# Patient Record
Sex: Female | Born: 1940 | Race: White | Hispanic: No | Smoking: Former smoker
Health system: Southern US, Community
[De-identification: ages and names within clinical notes are randomized; demographics above are authoritative.]

## PROBLEM LIST (undated history)

## (undated) DIAGNOSIS — G5630 Lesion of radial nerve, unspecified upper limb: Secondary | ICD-10-CM

## (undated) DIAGNOSIS — Z789 Other specified health status: Secondary | ICD-10-CM

## (undated) DIAGNOSIS — E785 Hyperlipidemia, unspecified: Secondary | ICD-10-CM

## (undated) DIAGNOSIS — M4802 Spinal stenosis, cervical region: Secondary | ICD-10-CM

## (undated) DIAGNOSIS — R911 Solitary pulmonary nodule: Secondary | ICD-10-CM

## (undated) DIAGNOSIS — S56419A Strain of extensor muscle, fascia and tendon of finger, unspecified finger at forearm level, initial encounter: Secondary | ICD-10-CM

## (undated) DIAGNOSIS — J449 Chronic obstructive pulmonary disease, unspecified: Secondary | ICD-10-CM

## (undated) DIAGNOSIS — F32A Depression, unspecified: Secondary | ICD-10-CM

## (undated) DIAGNOSIS — R42 Dizziness and giddiness: Secondary | ICD-10-CM

## (undated) DIAGNOSIS — I4891 Unspecified atrial fibrillation: Secondary | ICD-10-CM

## (undated) DIAGNOSIS — I639 Cerebral infarction, unspecified: Secondary | ICD-10-CM

## (undated) DIAGNOSIS — F419 Anxiety disorder, unspecified: Secondary | ICD-10-CM

## (undated) DIAGNOSIS — E039 Hypothyroidism, unspecified: Secondary | ICD-10-CM

## (undated) DIAGNOSIS — M879 Osteonecrosis, unspecified: Secondary | ICD-10-CM

## (undated) DIAGNOSIS — M47812 Spondylosis without myelopathy or radiculopathy, cervical region: Secondary | ICD-10-CM

## (undated) HISTORY — PX: OOPHORECTOMY: SHX6387

## (undated) HISTORY — PX: OTHER SURGICAL HISTORY: SHX169

## (undated) HISTORY — PX: APPENDECTOMY: SHX54

## (undated) HISTORY — PX: LOOP RECORDER IMPLANT: SHX5954

## (undated) HISTORY — PX: ABDOMINAL HYSTERECTOMY: SHX81

---

## 1969-05-26 HISTORY — PX: BREAST BIOPSY: SHX20

## 1969-05-26 HISTORY — PX: BREAST CYST EXCISION: SHX579

## 2006-01-15 ENCOUNTER — Emergency Department: Payer: Self-pay | Admitting: Emergency Medicine

## 2006-07-19 ENCOUNTER — Emergency Department: Payer: Self-pay | Admitting: Internal Medicine

## 2006-07-19 ENCOUNTER — Other Ambulatory Visit: Payer: Self-pay

## 2006-08-01 ENCOUNTER — Emergency Department: Payer: Self-pay | Admitting: Emergency Medicine

## 2012-05-03 LAB — CK TOTAL AND CKMB (NOT AT ARMC)
CK, Total: 98 U/L (ref 21–215)
CK-MB: 1.5 ng/mL (ref 0.5–3.6)

## 2012-05-03 LAB — BASIC METABOLIC PANEL
BUN: 9 mg/dL (ref 7–18)
Calcium, Total: 9.2 mg/dL (ref 8.5–10.1)
Chloride: 107 mmol/L (ref 98–107)
Creatinine: 0.82 mg/dL (ref 0.60–1.30)
EGFR (African American): >60
Glucose: 101 mg/dL — ABNORMAL HIGH (ref 65–99)
Potassium: 3.4 mmol/L — ABNORMAL LOW (ref 3.5–5.1)
Sodium: 142 mmol/L (ref 136–145)

## 2012-05-03 LAB — CBC
HCT: 31.2 % — ABNORMAL LOW (ref 35.0–47.0)
HGB: 10.1 g/dL — ABNORMAL LOW (ref 12.0–16.0)
MCHC: 32.4 g/dL (ref 32.0–36.0)
MCV: 88 fL (ref 80–100)
RBC: 3.56 10*6/uL — ABNORMAL LOW (ref 3.80–5.20)
RDW: 15.9 % — ABNORMAL HIGH (ref 11.5–14.5)
WBC: 11 10*3/uL (ref 3.6–11.0)

## 2012-05-03 LAB — TROPONIN I: Troponin-I: <0.02 ng/mL

## 2012-05-04 ENCOUNTER — Inpatient Hospital Stay: Payer: Self-pay | Admitting: Specialist

## 2012-05-04 LAB — CK TOTAL AND CKMB (NOT AT ARMC)
CK, Total: 95 U/L (ref 21–215)
CK-MB: 2 ng/mL (ref 0.5–3.6)

## 2012-05-05 LAB — BASIC METABOLIC PANEL
Anion Gap: 7 (ref 7–16)
Calcium, Total: 9.6 mg/dL (ref 8.5–10.1)
Co2: 28 mmol/L (ref 21–32)
Creatinine: 0.7 mg/dL (ref 0.60–1.30)
EGFR (African American): >60
EGFR (Non-African Amer.): >60
Glucose: 109 mg/dL — ABNORMAL HIGH (ref 65–99)
Osmolality: 285 (ref 275–301)
Potassium: 4.2 mmol/L (ref 3.5–5.1)

## 2012-05-05 LAB — CBC WITH DIFFERENTIAL/PLATELET
Basophil #: 0 10*3/uL (ref 0.0–0.1)
Eosinophil %: 0 %
HCT: 29.6 % — ABNORMAL LOW (ref 35.0–47.0)
Lymphocyte #: 1.2 10*3/uL (ref 1.0–3.6)
Lymphocyte %: 9.7 %
MCH: 28.4 pg (ref 26.0–34.0)
MCHC: 32.7 g/dL (ref 32.0–36.0)
MCV: 87 fL (ref 80–100)
Monocyte %: 4.7 %
Platelet: 331 10*3/uL (ref 150–440)
RDW: 15.9 % — ABNORMAL HIGH (ref 11.5–14.5)
WBC: 12.3 10*3/uL — ABNORMAL HIGH (ref 3.6–11.0)

## 2012-05-09 LAB — CULTURE, BLOOD (SINGLE)

## 2012-09-29 ENCOUNTER — Inpatient Hospital Stay: Payer: Self-pay | Admitting: Student

## 2012-09-29 LAB — CBC WITH DIFFERENTIAL/PLATELET
Basophil #: 0.1 10*3/uL (ref 0.0–0.1)
Basophil %: 0.6 %
Eosinophil #: 0.2 10*3/uL (ref 0.0–0.7)
HCT: 34.8 % — ABNORMAL LOW (ref 35.0–47.0)
HGB: 11.2 g/dL — ABNORMAL LOW (ref 12.0–16.0)
MCHC: 32.1 g/dL (ref 32.0–36.0)
Monocyte #: 0.9 x10 3/mm (ref 0.2–0.9)
Monocyte %: 5 %
Neutrophil #: 16.4 10*3/uL — ABNORMAL HIGH (ref 1.4–6.5)
Neutrophil %: 87.2 %
Platelet: 326 10*3/uL (ref 150–440)
RBC: 3.93 10*6/uL (ref 3.80–5.20)
WBC: 18.8 10*3/uL — ABNORMAL HIGH (ref 3.6–11.0)

## 2012-09-29 LAB — COMPREHENSIVE METABOLIC PANEL
Alkaline Phosphatase: 71 U/L (ref 50–136)
Anion Gap: 8 (ref 7–16)
BUN: 14 mg/dL (ref 7–18)
Calcium, Total: 9.6 mg/dL (ref 8.5–10.1)
Co2: 28 mmol/L (ref 21–32)
EGFR (African American): >60
EGFR (Non-African Amer.): >60
Potassium: 3.6 mmol/L (ref 3.5–5.1)
Total Protein: 7.2 g/dL (ref 6.4–8.2)

## 2012-09-29 LAB — TROPONIN I: Troponin-I: <0.02 ng/mL

## 2012-09-30 LAB — CBC WITH DIFFERENTIAL/PLATELET
Basophil #: 0 10*3/uL (ref 0.0–0.1)
Basophil %: 0.1 %
Eosinophil #: 0 10*3/uL (ref 0.0–0.7)
Eosinophil %: 0 %
HCT: 30.5 % — ABNORMAL LOW (ref 35.0–47.0)
HGB: 10.2 g/dL — ABNORMAL LOW (ref 12.0–16.0)
Lymphocyte #: 0.4 10*3/uL — ABNORMAL LOW (ref 1.0–3.6)
MCHC: 33.2 g/dL (ref 32.0–36.0)
MCV: 89 fL (ref 80–100)
Monocyte %: 2.2 %
Neutrophil #: 18.6 10*3/uL — ABNORMAL HIGH (ref 1.4–6.5)
Neutrophil %: 95.5 %
RBC: 3.45 10*6/uL — ABNORMAL LOW (ref 3.80–5.20)
WBC: 19.5 10*3/uL — ABNORMAL HIGH (ref 3.6–11.0)

## 2012-09-30 LAB — URINALYSIS, COMPLETE
Ketone: NEGATIVE
Leukocyte Esterase: NEGATIVE
Ph: 6 (ref 4.5–8.0)
Protein: NEGATIVE
RBC,UR: 1 /HPF (ref 0–5)
Specific Gravity: 1.012 (ref 1.003–1.030)
Squamous Epithelial: 1

## 2012-09-30 LAB — BASIC METABOLIC PANEL
Anion Gap: 6 — ABNORMAL LOW (ref 7–16)
BUN: 14 mg/dL (ref 7–18)
Co2: 29 mmol/L (ref 21–32)
EGFR (African American): >60
Glucose: 159 mg/dL — ABNORMAL HIGH (ref 65–99)
Osmolality: 281 (ref 275–301)

## 2012-10-01 LAB — BASIC METABOLIC PANEL
Anion Gap: 6 — ABNORMAL LOW (ref 7–16)
Calcium, Total: 9.6 mg/dL (ref 8.5–10.1)
Co2: 28 mmol/L (ref 21–32)
Creatinine: 0.6 mg/dL (ref 0.60–1.30)
EGFR (Non-African Amer.): >60
Glucose: 151 mg/dL — ABNORMAL HIGH (ref 65–99)

## 2012-10-01 LAB — HEMOGLOBIN A1C: Hemoglobin A1C: 5 % (ref 4.2–6.3)

## 2012-10-05 LAB — CULTURE, BLOOD (SINGLE)

## 2013-12-01 ENCOUNTER — Encounter (INDEPENDENT_AMBULATORY_CARE_PROVIDER_SITE_OTHER): Payer: Medicare Other | Admitting: Ophthalmology

## 2013-12-01 DIAGNOSIS — H3530 Unspecified macular degeneration: Secondary | ICD-10-CM

## 2013-12-01 DIAGNOSIS — H26499 Other secondary cataract, unspecified eye: Secondary | ICD-10-CM

## 2013-12-01 DIAGNOSIS — H43819 Vitreous degeneration, unspecified eye: Secondary | ICD-10-CM

## 2013-12-15 ENCOUNTER — Encounter (INDEPENDENT_AMBULATORY_CARE_PROVIDER_SITE_OTHER): Payer: Medicare Other | Admitting: Ophthalmology

## 2013-12-15 DIAGNOSIS — H26499 Other secondary cataract, unspecified eye: Secondary | ICD-10-CM

## 2014-01-12 ENCOUNTER — Encounter (INDEPENDENT_AMBULATORY_CARE_PROVIDER_SITE_OTHER): Payer: Medicare Other | Admitting: Ophthalmology

## 2014-01-12 DIAGNOSIS — H2700 Aphakia, unspecified eye: Secondary | ICD-10-CM

## 2014-09-12 NOTE — Discharge Summary (Signed)
PATIENT NAME:  Christine Morris, Christine Morris MR#:  [id number] DATE OF BIRTH:  Aug 24, 1940  DATE OF ADMISSION:  05/04/2012 DATE OF DISCHARGE:  05/05/2012  For a detailed note, please take a look at the history and physical done on admission by Dr. Margaretmary Eddy.   DIAGNOSES AT DISCHARGE:  1. Chest pain, likely related to chronic obstructive pulmonary disease.  2. Chronic obstructive pulmonary disease exacerbation.  3. Pneumonia, likely community-acquired pneumonia.  4. Hypothyroidism.  5. Anxiety.  6. Gastroesophageal reflux disease.   DIET: Patient is being discharged on a low sodium, low fat diet.   ACTIVITY: As tolerated.   FOLLOW UP: Follow up with Dr. Ronnald Collum in the next 1 to 2 weeks.   DISCHARGE MEDICATIONS:  1. Fenofibrate 145 mg daily.  2. Albuterol inhaler 2 puffs q.6 hours. 3. Advair 500/50, 1 puff b.i.d.  4. Spiriva 1 puff daily.  5. Albuterol ipratropium nebulizer b.i.d. as needed.  6. Vytorin 10/20 daily.  7. Synthroid 100 mcg daily.  8. Calcitriol 0.5 mcg one cap daily.  9. Vitamin D2 50,000 international units 3 times weekly.  10. Singulair 10 mg b.i.d.  11. Paxil 20 mg daily.  12. Prednisone taper starting at 60 mg down to 10 mg over the next six days. 13. Levaquin 500 mg daily x5 days.   LABORATORY, DIAGNOSTIC AND RADIOLOGICAL DATA: Pertinent studies during hospital course are as follows: Chest x-ray done on admission showing atelectasis versus infiltrate in the region of the lingual.   HOSPITAL COURSE: This is a 74 year old female with medical problems as mentioned above presented to the hospital with exertional shortness of breath and also sharp chest pain.  1. Chest pain. Patient was observed on off unit telemetry. Had three sets of cardiac markers checked which were negative. Her chest pain shortly resolved the day after admission. Most likely the cause of her chest pain was related to her chronic obstructive pulmonary disease and pneumonia, unlikely related to acute cardiac  process.  2. Chronic obstructive pulmonary disease exacerbation. This was likely secondary to community-acquired pneumonia. Patient was started on IV Solu-Medrol along with her maintenance inhalers, Advair and Spiriva, and around the clock nebulizer treatments and also given empiric antibiotics. Patient has clinically improved since yesterday. Is currently being discharged on a prednisone taper along with Levaquin for a few days. She will continue her Advair, Spiriva and her nebulizer treatments as stated.  3. Community-acquired pneumonia, likely the cause of patient's chronic obstructive pulmonary disease exacerbation. Patient remained afebrile. Her blood cultures remained negative although she was empirically treated with ceftriaxone and Zithromax and is currently being discharged on p.o. Levaquin as stated.  4. Hypothyroidism. Patient was maintained on Synthroid and she will resume that.  5. Hyperlipidemia. Patient will continue her Vytorin as stated.  6. Anxiety. The patient will continue her Paxil as she was maintained on that.  7. CODE STATUS: Patient is a FULL CODE. She is also being discharged on home oxygen. She was already using oxygen at home prior to coming to the hospital.   TIME SPENT: 40 minutes.  ____________________________ Belia Heman. Verdell Carmine, MD vjs:cms D: 05/05/2012 15:27:24 ET T: 05/06/2012 09:13:55 ET JOB#: [id number]  cc: Belia Heman. Verdell Carmine, MD, <Dictator> Lenard Simmer, MD Henreitta Leber MD ELECTRONICALLY SIGNED 05/20/2012 14:40

## 2014-09-12 NOTE — H&P (Signed)
PATIENT NAME:  Christine Morris, Christine Morris MR#:  [id number] DATE OF BIRTH:  Apr 16, 1941  DATE OF ADMISSION:  05/04/2012  PRIMARY CARE PHYSICIAN: Lenard Simmer, MD    REFERRING PHYSICIAN:   Dr. Dahlia Client  CHIEF COMPLAINT: Chest pain and shortness of breath.   HISTORY OF PRESENT ILLNESS: The patient is a 74 year old Caucasian female with a past medical history of hypothyroidism, chronic obstructive pulmonary disease, oxygen dependent, lives on 3 liters, and hyperlipidemia is presenting to the ER with a chief complaint of left-sided chest pain, which started at 7:00 p.m. today. Eventually, she started having shortness of breath at 8:30 p.m. she is reporting that the chest hurts when she takes deep inspiration only in the left inframammary area. This is associated with cough which is nonproductive. She denies any fever, denies any sick contacts. The patient has received a flu shot recently in October. She denies any dizziness or weakness. No other complaints. Chest x-ray in the ER has revealed left lower lobe infiltrate, and the patient was given IV Rocephin and Zithromax, last doses after obtaining blood cultures. During my examination, the patient is feeling slightly better. The patient also has received Solu-Medrol 125 mg IV for shortness of breath. She denies any recent history of antibiotic use. She denies any other complaints.   PAST MEDICAL HISTORY:  1. Hypothyroidism.  2. Chronic obstructive pulmonary disease, oxygen dependent, lives on 3 liters.  3. Hyperlipidemia.   PAST SURGICAL HISTORY:  1. Appendectomy.  2. Two C-sections.  3. Hysterectomy for polycystic ovarian syndrome.   ALLERGIES: No known drug allergies.     HOME MEDICATIONS:   1. Vytorin 10/20 p.o. once daily. 2. Vitamin D2 50,000, one capsules p.o. three times a week. 3. Synthroid 100 mcg once a day.  4. Spiriva 18 mcg inhalation, one inhalation each day.  5. ProAir 2 puffs inhalation every six hours. 6. Paroxetine 20 mg, one  tablet p.o. once a day. 7. Montelukast 10 mg twice a day ? about frequency.  8. Fenofibrate 145 mg, one tablet once a day. 9. Calcitriol 0.5 mcg once a day.  10. Albuterol ipratropium 2.5 mg inhalation two times a day.  11. Advair 500/50, one puff inhalation two times a day.   PSYCHOSOCIAL HISTORY: She lives at home, lives with fiance. She used to smoke, quit smoking two years ago. She denies any alcohol or illicit drug usage.   FAMILY HISTORY: Mother deceased with brain tumor. Dad deceased with liver cancer.   REVIEW OF SYSTEMS: CONSTITUTIONAL: Denies any fever. Complaining of weakness but denies any fatigue, pain, weight loss or weight gain. EYES: Denies any blurry vision, glaucoma, cataracts. ENT: Denies tinnitus, ear pain, ear discharge, snoring, postnasal drip, difficulty in swallowing. RESPIRATORY: Positive cough which is dry in nature. Complaining of wheezing. Denies hemoptysis. Positive shortness of breath. Denies asthma. Positive chronic obstructive pulmonary disease, oxygen dependent. CARDIOVASCULAR: Complaining of chest pain in the left inframammary area with deep inspirations only. Denies any orthopnea. Denies syncope, varicose veins, azotemia. GI: Denies nausea, vomiting, diarrhea, abdominal pain jaundice, rectal bleeding, constipation. GENITOURINARY: Denies dysuria, hematuria, renal calculus. GYN AND BREASTS: Denies breast tenderness. Vaginal discharge. ENDOCRINE: Denies polyuria, polyphagia, polydipsia. Denies any thyroid problems. HEMATOLOGIC/LYMPHATIC: Denies anemia, easy bruising, bleeding. INTEGUMENTARY: Denies acne, rash, lesions. MUSCULOSKELETAL: Denies any pain in the neck, back, shoulders, hip pain. Denies gout redness. NEUROLOGIC: Denies vertigo, ataxia, transient ischemic attack, seizure, memory loss, dysarthria. PSYCHIATRIC: Denies any anxiety, insomnia, bipolar disorder, depression.   PHYSICAL EXAMINATION:  VITAL SIGNS: Temperature 97.7,  pulse 92, respiratory rate 20, blood  pressure 117/57.   GENERAL APPEARANCE: Not in acute distress, moderately built and moderately nourished.  HEENT: Normocephalic, atraumatic. Pupils are equally reacting to light and accommodation. No scleral icterus. No conjunctival injection. Moist mucous membranes. No postnasal drip. Nares with no congestion. No ear discharge.  Tympanic membranes are intact.   NECK: Supple. No JVD. No thyromegaly. No carotid bruits.   LUNGS: Left lower lobe crackles. Minimal wheezing. No accessory muscle usage. Positive reproducible left-sided inframammary chest wall tenderness. No pulmonary edema. Peripheral pulses are 2+. No pedal edema.   GI: Soft. Bowel sounds are positive in all four quadrants. Nontender, nondistended. No masses felt.   NEUROLOGIC: Awake, alert, and oriented x3. Cranial nerves II through XII are grossly intact. Motor and sensory is intact. Reflexes are 2+.   SKIN: No acne, no rashes, no lesions.   MUSCULOSKELETAL: No joint swelling, effusion, erythema, tenderness. Range of motion is grossly intact.   EXTREMITIES: No edema. No cyanosis. No clubbing.   LABORATORY, DIAGNOSTIC AND RADIOLOGICAL DATA: Chest x-ray preliminary report: Left lower lobe infiltrate. WBC 11.0, hemoglobin 10.1, hematocrit 31.2, platelet count 365,000, MCV 88. Serum glucose 101,BUN  9, creatinine 0.82, serum sodium 142, potassium 3.4, chloride 107, CO2 29, calcium 9.2, anion gap is 6. GFR is greater than 60, serum osmolality 282. Troponin T less than 0.02. CK total 98, CK-MB 1.5. A 12-lead EKG: Normal sinus rhythm, nonspecific ST-T wave changes, normal PR interval with duration 132 milliseconds, normal QT  interval.    ASSESSMENT/PLAN:   1. Left lower lobe pneumonia: Admit to telemetry bed. We will get sputum culture and sensitivity. IV Rocephin and Zithromax. Bronchodilators on as needed basis.  2. Left inframammary chest pain with shortness of breath: Probably from problem #1. We will cycle cardiac biomarkers to rule  out acute coronary syndrome as well. Continue pneumonia treatment with IV Rocephin and Zithromax.  3. Mild chronic obstructive pulmonary disease exacerbation: The patient is oxygen dependent. We will continue 3 liters of oxygen to titrate pulse oximetry 90%. Solu-Medrol 125 mg IV x1 was given in the ER. We will continue 40 mg IV every 12 hours. Duo-Neb treatment every.6 hours. Proventil neb treatments every 4 hours as needed for shortness of breath.  4. Hyperlipidemia: Continue the patient's home medication, fenofibrate.  5. Hypothyroidism: Continue Synthroid.  6. We will provide GI and deep vein thrombosis prophylaxis with Protonix and Lovenox subcutaneous.  7. Check a.m. labs, CBC and BMP. Cycle cardiac biomarkers.   The diagnosis and plan of care was discussed in detail with the patient and her fiance at bedside. They both expressed understanding of the plan.   CODE STATUS:  The patient is FULL CODE.     TIME SPENT:   Time spent on the History and Physical and coordination of care and reviewing medical records is 50 minutes.   ____________________________ Nicholes Mango, MD ag:cbb D: 05/04/2012 01:12:52 ET T: 05/04/2012 07:48:21 ET JOB#: [id number] cc: Nicholes Mango, MD, <Dictator> Lenard Simmer, MD Nicholes Mango MD ELECTRONICALLY SIGNED 05/06/2012 1:39

## 2014-09-15 NOTE — H&P (Signed)
PATIENT NAME:  Christine Morris, Christine Morris MR#:  [id number] DATE OF BIRTH:  17-Sep-1940  DATE OF ADMISSION:  09/29/2012  PRIMARY CARE PHYSICIAN: Lenard Simmer, MD  EMERGENCY ROOM PHYSICIAN: Ponciano Ort, MD  CHIEF COMPLAINT: Shortness of breath.   HISTORY OF PRESENT ILLNESS: The patient is a 74 year old female patient with oxygen-dependent COPD, brought in by EMS because of increased shortness of breath and fever. The patient felt fine yesterday and also this morning. Started to have sudden shortness of breath at 1:00 in the afternoon and called EMS. When EMS arrived, the patient's O2 sats were around 83% on 3 liters, so EMS gave her 3 rounds of nebulizers and 1 dose of Solu-Medrol 62.5 mg. When she came here, the patient's temperature was 102.9 and she was saturating around 92% on 3 liters. The patient is tachycardic at a heart rate of 102 beats per minute and WBC is up to 18 with chest x-ray showing pneumonia. I was asked to admit for pneumonia and acute respiratory failure. Seeing the patient at bedside. Says that  sudden shortness of breath started this afternoon with some pleuritic chest pain on the left side. Denies any other complaints. No cough and also, the patient denies any body aches.   PAST MEDICAL HISTORY: Significant for history of hypertension, hyperlipidemia, COPD, oxygen dependent. She uses 3 liters of oxygen all the time for the past few months. Other past medical history includes hypothyroidism and also history of GERD and anxiety.   ALLERGIES: No known allergies.   SOCIAL HISTORY: Heavy smoker before, quit now. No alcohol. No drugs.   FAMILY HISTORY: Significant for history of mother with brain tumor. Dad had liver cancer.   PAST SURGICAL HISTORY: Significant for appendectomy, 2 cesareans and hysterectomy.  HOME MEDICATIONS:    1.  Advair Diskus 500/50 1 puff b.i.d.  2.  Albuterol 30 units 3 times a day.  3.  Calcitriol 0.5 mcg p.o. daily.  4.  TriCor 145 mg p.o. daily.  5.   Montelukast 10 mg p.o. daily.  6.  Paxil 20 mg p.o. daily.  7.  ProAir 90 mcg inhalation every 6 hours. 8.  Spiriva 18 mcg inhalation daily.  9.  Synthroid 100 mcg p.o. daily.  10.  Vitamin D2, 50,000 units Monday, Wednesday, Friday.  11.  Vytorin 10/20 mg 1 tablet daily.   REVIEW OF SYSTEMS:  CONSTITUTIONAL: Denies any weakness. Found to have fever in the Emergency Room.  EYES: No blurred vision.  EARS, NOSE, THROAT: Denies any sore throat. No ear pain. No difficulty swallowing.  GASTROINTESTINAL: Denies any nausea or vomiting. No abdominal pain.  GENITOURINARY: No dysuria.  MUSCULOSKELETAL: Denies any joint pain.  SKIN: No skin rashes.  NEUROLOGIC: Denies any headaches. PSYCHIATRIC: Does have anxiety.   PHYSICAL EXAMINATION:  VITAL SIGNS: Temperature 102.9 degrees Fahrenheit in the Emergency Room, heart rate 135, blood pressure 107/66, respiratory rate around 40 and O2 sats 92% on 3 liters. The patient initially was on 3 liters of oxygen, but she was started on BiPAP and right now, she is on BiPAP 10/5 and 28% FiO2, saturating around 97%.  GENERAL: The patient is alert, awake, oriented, appearing slightly anxious but not using accessory muscles for respiration. HEENT: Head atraumatic, normocephalic. Pupils equally reacting to light. Extraocular movements intact. No scleral icterus. No conjunctival injection. Mouth: Mucous membranes are dry. Nose: No turbinate hypertrophy. Ears: No tympanic membrane congestion. Throat: No oropharyngeal erythema.  NECK: Normal range of motion. No JVD. No carotid bruits. LUNGS: The  patient has some diffuse expiratory wheezing in all lung fields, not using accessory muscles for respiration. Some reproducible chest wall tenderness present on the left side.  CARDIOVASCULAR: Peripheral pulses are intact. No pedal edema.  ABDOMEN: Soft, nontender, nondistended. Bowel sounds present. No hernias.  MUSCULOSKELETAL: The patient has no joint swelling.  NEUROLOGIC:  Alert, awake, oriented. No focal neurological deficit. Cranial nerves II through XII intact. Power 5/5 in upper and lower extremities. Sensation is intact. DTRs 2+ bilaterally.  EXTREMITIES: No edema. No cyanosis. No clubbing.  SKIN: No skin rashes. Skin is slightly dry.   LABORATORY, DIAGNOSTIC AND RADIOLOGICAL DATA: White count 18.8, hemoglobin 11.2, hematocrit 34.8, platelets 326. Electrolytes: Sodium 141, potassium 3.6, chloride 105, bicarbonate 28, BUN 14, creatinine 0.7 and glucose 111. Troponin less than 0.02. EKG showed sinus tachycardia with 137 beats per minute. Chest x-ray consistent with some opacity in the left lower lobe.   ASSESSMENT AND PLAN: This is a 74 year old female patient with oxygen-dependent chronic obstructive pulmonary disease. Came in with shortness of breath, pleuritic chest pain with hypoxia, 83% saturations on 3 liters at home by [facility] with evidence of fever, tachycardia and leukocytosis. The patient is going to be admitted to hospitalist service.   DIAGNOSES:  1.  Systemic inflammatory response syndrome: Source likely is pneumonia. We are going to continue intravenous fluids, normal saline at 100 mL/h. She already had blood cultures. Please follow them. Continue intravenous Rocephin and Zithromax and follow the white count. The patient is tachycardic, likely secondary to underlying fever and pneumonia, so continue intravenous fluids and we can use a small dose of Cardizem if the heart rate continues to go up. We will start with 30 mg every 6 hours.  2.  Acute on chronic respiratory failure secondary to pneumonia and chronic obstructive pulmonary disease exacerbation: The patient is very anxious to come off the BiPAP. We will turn down BiPAP and start nasal cannula at 3 liters and see how she does. Continue Solu-Medrol, DuoNebs and oxygen 3 liters to keep saturations around 90% to 94%.  3.  Sudden shortness of breath and pleuritic chest pain with  tachycardia and hypoxia: We will evaluate for pulmonary emboli with CT chest with pulmonary embolism protocol.  4.  Anxiety: Continue Paxil.  5.  History of hyperlipidemia: The patient is on TriCor and also Vytorin. We will continue the TriCor and Vytorin.  6.  Hypothyroidism: Continue Synthroid 100 mcg orally daily. 7.  History of tobacco abuse before, now stopped: The patient is on Spiriva. Will continue that.  8.  The patient will be admitted to telemetry.   TIME SPENT ON HISTORY AND PHYSICAL: About 55 minutes. This is a critical history and physical.    ____________________________ Epifanio Lesches, MD sk:jm D: 09/29/2012 17:16:21 ET T: 09/29/2012 18:10:41 ET JOB#: [id number]  cc: Epifanio Lesches, MD, <Dictator> Lenard Simmer, MD Epifanio Lesches MD ELECTRONICALLY SIGNED 10/10/2012 17:47

## 2014-09-15 NOTE — Discharge Summary (Signed)
PATIENT NAME:  Christine Morris, Christine Morris MR#:  [id number] DATE OF BIRTH:  09-12-1940  DATE OF ADMISSION:  09/29/2012 DATE OF DISCHARGE:  10/03/2012  PRIMARY CARE PHYSICIAN: Belinda Fisher, MD  CHIEF COMPLAINT: Shortness of breath.   DISCHARGE DIAGNOSES: 1.  Acute on chronic respiratory failure secondary to pneumonia and chronic obstructive pulmonary disease exacerbation.  2.  Sepsis. 3.  Chronic oxygen-dependent respiratory failure.  4.  Hypertension.  5.  Hyperlipidemia.  6.  Hypothyroidism.  7.  History of gastroesophageal reflux disease. 8.  History of anxiety.   DISCHARGE MEDICATIONS:  1.  Fenofibrate 145 mg once a day. 2.  Advair 500/50 mcg 1 puff 2 times a day.  3.  Spiriva 18 mcg daily.  4.  Vytorin 10/20 mg 1 tab once a day.  5.  Synthroid 100 mcg 1 tab daily.  6.  Calcitriol 0.5 mg once a day.  7.  Vitamin D2 50,000 international units 3 times a week on Monday, Wednesday, and Friday. 8.  Paroxetine 20 mg daily.  9.  Albuterol/ipratropium 2.5/0.5 mg/3 mL inhaled solution 3 mL 4 times a day.  10.  Montelukast 10 mg every 12 hours.  11.  ProAir 2 every 6 hours as needed for wheezing or shortness of breath.  12.  HC/nystatin mouthwash 10 mL every 8 hours.  13.  Vantin 200 mg every 12 hours for 4 days.  14.  Prednisone taper 40 mg for 2 days, then 30 mg for 2 days, then 20 mg for 2, then 10 mg for 2 days then stop.    HOME OXYGEN:  She will be getting discharged with 3 liters of continuous oxygen via nasal cannula.   DIET: Low sodium, low fat, low cholesterol.   ACTIVITY: As tolerated.   DISCHARGE INSTRUCTIONS:  Please follow with PCP within 1 to 2 weeks and obtain a CT of the chest in 3 to 4 months for the lung nodule on the right lobe and abnormality seen in the left upper lobe.   DISPOSITION: Home.   CODE STATUS: FULL CODE.   SIGNIFICANT LABORATORY AND DIAGNOSTICS:  Initial WBC 18.8.  On May 9th it was 15.5. Initial hemoglobin 11.2. Initial troponin negative. LFTs on  arrival within normal limits. Albumin on arrival 14, creatinine 0.71, sodium 141, and potassium 3.6.   Blood cultures on arrival: No growth to date.  UA the day after admission did not show evidence for UTI.    CT of chest for PE protocol showed no PE, heterogeneous opacities in the left upper lobe and lingula.  Some of these are somewhat nodular in morphology. Subcentimeter area of what may represent cavitation, in the left upper lobe, and an intermediate 4 mm nodule in the right middle lobe.  HISTORY OF PRESENT ILLNESS AND HOSPITAL COURSE:  For full details of H and P,  please see the dictation by Dr. Vianne Bulls on May 7th, but briefly this is a pleasant 74 year old female with oxygen-dependent chronic respiratory failure and COPD who was brought in by EMS and was noted to have hypoxic respiratory failure with O2 sat of 83% on her chronic 3 liters of oxygen. The patient received IV steroids and nebs.  She was noted to have a fever and leukocytosis and evidence for pneumonia on imaging. She was admitted to the hospitalist service, started on ceftriaxone and azithromycin as well as treatment for COPD with IV steroids, nebs and her inhalers. Initially she did require some BiPAP as well however came off of the BiPAP. She  did well through hospitalization with improved breathing and resolution of her acute element of the respiratory failure. She had a dry cough and therefore could not give adequate sputum sample. The patient did have sepsis per criteria including fever, leukocytosis and evidence for pneumonia, which has resolved likely; however, we have not trended the WBC as of May 9th given the drop in white count. Blood cultures have been negative and the fever has resolved, however. I believe that the sepsis is likely secondary to the pneumonia which is getting adequately treated. In regards to the abnormal finding on the CAT scan, she was found to have a nodule on the right lung as well as nodular  possible  cavitary on the left upper lobe. The case was discussed with pulmonary, by Dr. Leslye Peer, and it was determined that there is no need for isolation, but that this is most likely pneumonia. I discussed these findings with the patient and a repeat CAT scan is recommended to her in 3 to 4 months. At this point, she will get discharged with outpatient follow-up. Her breathing is back to her normal baseline.   The patient is FULL CODE.   TOTAL TIME SPENT: 33 minutes. ____________________________ Vivien Presto, MD sa:sb D: 10/03/2012 13:10:17 ET T: 10/04/2012 09:04:43 ET JOB#: [id number]  cc: Vivien Presto, MD, <Dictator> Lenard Simmer, MD Vivien Presto MD ELECTRONICALLY SIGNED 10/18/2012 15:10

## 2014-09-23 ENCOUNTER — Emergency Department
Admit: 2014-09-23 | Disposition: A | Discharge status: Home / Self Care | Payer: Self-pay | Admitting: Emergency Medicine

## 2015-05-08 DIAGNOSIS — F419 Anxiety disorder, unspecified: Secondary | ICD-10-CM | POA: Insufficient documentation

## 2015-05-08 DIAGNOSIS — F32A Depression, unspecified: Secondary | ICD-10-CM | POA: Insufficient documentation

## 2015-05-27 DIAGNOSIS — I4891 Unspecified atrial fibrillation: Secondary | ICD-10-CM

## 2015-05-27 HISTORY — DX: Unspecified atrial fibrillation: I48.91

## 2017-10-11 ENCOUNTER — Observation Stay: Payer: Medicare Other

## 2017-10-11 ENCOUNTER — Encounter: Payer: Self-pay | Admitting: Emergency Medicine

## 2017-10-11 ENCOUNTER — Observation Stay (HOSPITAL_BASED_OUTPATIENT_CLINIC_OR_DEPARTMENT_OTHER)
Admit: 2017-10-11 | Discharge: 2017-10-11 | Disposition: A | Discharge status: Home / Self Care | Payer: Medicare Other | Attending: Internal Medicine | Admitting: Internal Medicine

## 2017-10-11 ENCOUNTER — Other Ambulatory Visit: Payer: Self-pay

## 2017-10-11 ENCOUNTER — Inpatient Hospital Stay
Admission: EM | Admit: 2017-10-11 | Discharge: 2017-10-12 | DRG: 065 | Disposition: A | Discharge status: Home Health | Payer: Medicare Other | Attending: Internal Medicine | Admitting: Internal Medicine

## 2017-10-11 ENCOUNTER — Emergency Department: Payer: Medicare Other

## 2017-10-11 DIAGNOSIS — I63411 Cerebral infarction due to embolism of right middle cerebral artery: Secondary | ICD-10-CM | POA: Diagnosis not present

## 2017-10-11 DIAGNOSIS — G459 Transient cerebral ischemic attack, unspecified: Secondary | ICD-10-CM | POA: Diagnosis present

## 2017-10-11 DIAGNOSIS — J449 Chronic obstructive pulmonary disease, unspecified: Secondary | ICD-10-CM | POA: Diagnosis present

## 2017-10-11 DIAGNOSIS — E781 Pure hyperglyceridemia: Secondary | ICD-10-CM | POA: Diagnosis not present

## 2017-10-11 DIAGNOSIS — I5030 Unspecified diastolic (congestive) heart failure: Secondary | ICD-10-CM

## 2017-10-11 DIAGNOSIS — F329 Major depressive disorder, single episode, unspecified: Secondary | ICD-10-CM | POA: Diagnosis not present

## 2017-10-11 DIAGNOSIS — R29701 NIHSS score 1: Secondary | ICD-10-CM | POA: Diagnosis present

## 2017-10-11 DIAGNOSIS — Z79899 Other long term (current) drug therapy: Secondary | ICD-10-CM | POA: Diagnosis not present

## 2017-10-11 DIAGNOSIS — E039 Hypothyroidism, unspecified: Secondary | ICD-10-CM | POA: Diagnosis present

## 2017-10-11 DIAGNOSIS — N390 Urinary tract infection, site not specified: Secondary | ICD-10-CM | POA: Diagnosis not present

## 2017-10-11 DIAGNOSIS — R4781 Slurred speech: Secondary | ICD-10-CM | POA: Diagnosis present

## 2017-10-11 DIAGNOSIS — Z7982 Long term (current) use of aspirin: Secondary | ICD-10-CM

## 2017-10-11 DIAGNOSIS — Z7951 Long term (current) use of inhaled steroids: Secondary | ICD-10-CM | POA: Diagnosis not present

## 2017-10-11 DIAGNOSIS — Z9981 Dependence on supplemental oxygen: Secondary | ICD-10-CM | POA: Diagnosis not present

## 2017-10-11 DIAGNOSIS — Z87891 Personal history of nicotine dependence: Secondary | ICD-10-CM | POA: Diagnosis not present

## 2017-10-11 DIAGNOSIS — R531 Weakness: Secondary | ICD-10-CM

## 2017-10-11 DIAGNOSIS — G8194 Hemiplegia, unspecified affecting left nondominant side: Secondary | ICD-10-CM | POA: Diagnosis not present

## 2017-10-11 DIAGNOSIS — I639 Cerebral infarction, unspecified: Secondary | ICD-10-CM | POA: Diagnosis present

## 2017-10-11 DIAGNOSIS — B9620 Unspecified Escherichia coli [E. coli] as the cause of diseases classified elsewhere: Secondary | ICD-10-CM | POA: Diagnosis present

## 2017-10-11 HISTORY — DX: Dizziness and giddiness: R42

## 2017-10-11 HISTORY — DX: Chronic obstructive pulmonary disease, unspecified: J44.9

## 2017-10-11 LAB — URINALYSIS, COMPLETE (UACMP) WITH MICROSCOPIC
Bilirubin Urine: NEGATIVE
GLUCOSE, UA: NEGATIVE mg/dL
Ketones, ur: NEGATIVE mg/dL
NITRITE: POSITIVE — AB
PROTEIN: NEGATIVE mg/dL
SPECIFIC GRAVITY, URINE: 1.013 (ref 1.005–1.030)
pH: 6 (ref 5.0–8.0)

## 2017-10-11 LAB — COMPREHENSIVE METABOLIC PANEL
ALK PHOS: 49 U/L (ref 38–126)
ALT: 17 U/L (ref 14–54)
ANION GAP: 7 (ref 5–15)
AST: 25 U/L (ref 15–41)
Albumin: 3.9 g/dL (ref 3.5–5.0)
BILIRUBIN TOTAL: 0.5 mg/dL (ref 0.3–1.2)
BUN: 21 mg/dL — ABNORMAL HIGH (ref 6–20)
CALCIUM: 9.6 mg/dL (ref 8.9–10.3)
CO2: 30 mmol/L (ref 22–32)
Chloride: 104 mmol/L (ref 101–111)
Creatinine, Ser: 0.79 mg/dL (ref 0.44–1.00)
GFR calc Af Amer: >60 mL/min (ref >60)
GFR calc non Af Amer: >60 mL/min (ref >60)
Glucose, Bld: 102 mg/dL — ABNORMAL HIGH (ref 65–99)
POTASSIUM: 3.5 mmol/L (ref 3.5–5.1)
Sodium: 141 mmol/L (ref 135–145)
TOTAL PROTEIN: 7.4 g/dL (ref 6.5–8.1)

## 2017-10-11 LAB — PROTIME-INR
INR: 0.96
PROTHROMBIN TIME: 12.7 s (ref 11.4–15.2)

## 2017-10-11 LAB — CBC
HCT: 37.1 % (ref 35.0–47.0)
Hemoglobin: 12.2 g/dL (ref 12.0–16.0)
MCH: 29.9 pg (ref 26.0–34.0)
MCHC: 33 g/dL (ref 32.0–36.0)
MCV: 90.7 fL (ref 80.0–100.0)
PLATELETS: 329 10*3/uL (ref 150–440)
RBC: 4.09 MIL/uL (ref 3.80–5.20)
RDW: 14.3 % (ref 11.5–14.5)
WBC: 10.9 10*3/uL (ref 3.6–11.0)

## 2017-10-11 LAB — DIFFERENTIAL
Basophils Absolute: 0.1 10*3/uL (ref 0–0.1)
Basophils Relative: 1 %
EOS ABS: 0.9 10*3/uL — AB (ref 0–0.7)
Eosinophils Relative: 9 %
LYMPHS ABS: 1.7 10*3/uL (ref 1.0–3.6)
LYMPHS PCT: 16 %
MONO ABS: 0.7 10*3/uL (ref 0.2–0.9)
MONOS PCT: 6 %
NEUTROS PCT: 68 %
Neutro Abs: 7.4 10*3/uL — ABNORMAL HIGH (ref 1.4–6.5)

## 2017-10-11 LAB — LIPID PANEL
Cholesterol: 154 mg/dL (ref 0–200)
HDL: 63 mg/dL (ref >40)
LDL Cholesterol: 65 mg/dL (ref 0–99)
Total CHOL/HDL Ratio: 2.4 RATIO
Triglycerides: 132 mg/dL (ref <150)
VLDL: 26 mg/dL (ref 0–40)

## 2017-10-11 LAB — ECHOCARDIOGRAM COMPLETE
Height: 63 in
Weight: 2320 oz

## 2017-10-11 LAB — HEMOGLOBIN A1C
Hgb A1c MFr Bld: 5.5 % (ref 4.8–5.6)
Mean Plasma Glucose: 111.15 mg/dL

## 2017-10-11 LAB — TROPONIN I

## 2017-10-11 LAB — LACTIC ACID, PLASMA: Lactic Acid, Venous: 1.2 mmol/L (ref 0.5–1.9)

## 2017-10-11 LAB — APTT: APTT: 36 s (ref 24–36)

## 2017-10-11 MED ORDER — SODIUM CHLORIDE 0.9% FLUSH: 3.0000 mL | INTRAVENOUS | Status: DC | PRN

## 2017-10-11 MED ORDER — VITAMIN B-12 1000 MCG PO TABS
1000.0000 ug | ORAL_TABLET | Freq: Every day | ORAL | Status: DC
  Administered 2017-10-11 – 2017-10-12 (×2): 1000 ug via ORAL
  Filled 2017-10-11 (×2): qty 1

## 2017-10-11 MED ORDER — ACETAMINOPHEN 160 MG/5ML PO SOLN: 650.0000 mg | ORAL | Status: DC | PRN

## 2017-10-11 MED ORDER — SIMVASTATIN 20 MG PO TABS
40.0000 mg | ORAL_TABLET | Freq: Every day | ORAL | Status: DC
  Administered 2017-10-11 – 2017-10-12 (×2): 40 mg via ORAL
  Filled 2017-10-11 (×2): qty 2

## 2017-10-11 MED ORDER — CALCITRIOL 0.25 MCG PO CAPS
0.5000 ug | ORAL_CAPSULE | Freq: Every day | ORAL | Status: DC
  Administered 2017-10-11 – 2017-10-12 (×2): 0.5 ug via ORAL
  Filled 2017-10-11 (×2): qty 2

## 2017-10-11 MED ORDER — UMECLIDINIUM BROMIDE 62.5 MCG/INH IN AEPB
1.0000 | INHALATION_SPRAY | Freq: Every day | RESPIRATORY_TRACT | Status: DC
  Administered 2017-10-11 – 2017-10-12 (×2): 1 via RESPIRATORY_TRACT
  Filled 2017-10-11: qty 7

## 2017-10-11 MED ORDER — SODIUM CHLORIDE 0.9% FLUSH
3.0000 mL | Freq: Two times a day (BID) | INTRAVENOUS | Status: DC
  Administered 2017-10-11 – 2017-10-12 (×2): 3 mL via INTRAVENOUS

## 2017-10-11 MED ORDER — SODIUM CHLORIDE 0.9 % IV SOLN
1.0000 g | INTRAVENOUS | Status: DC
  Administered 2017-10-11 – 2017-10-12 (×2): 1 g via INTRAVENOUS
  Filled 2017-10-11: qty 1
  Filled 2017-10-11: qty 10
  Filled 2017-10-11: qty 1

## 2017-10-11 MED ORDER — SODIUM CHLORIDE 0.9 % IV SOLN
INTRAVENOUS | Status: DC
  Administered 2017-10-11: 06:00:00 via INTRAVENOUS

## 2017-10-11 MED ORDER — ACETAMINOPHEN 325 MG PO TABS: 650.0000 mg | ORAL_TABLET | ORAL | Status: DC | PRN

## 2017-10-11 MED ORDER — LEVOTHYROXINE SODIUM 88 MCG PO TABS
88.0000 ug | ORAL_TABLET | Freq: Every day | ORAL | Status: DC
  Administered 2017-10-11 – 2017-10-12 (×2): 88 ug via ORAL
  Filled 2017-10-11 (×2): qty 1

## 2017-10-11 MED ORDER — STROKE: EARLY STAGES OF RECOVERY BOOK
Freq: Once | Status: AC
  Administered 2017-10-11: 06:00:00

## 2017-10-11 MED ORDER — FLUTICASONE FUROATE-VILANTEROL 200-25 MCG/INH IN AEPB
1.0000 | INHALATION_SPRAY | Freq: Every day | RESPIRATORY_TRACT | Status: DC
  Administered 2017-10-11 – 2017-10-12 (×2): 1 via RESPIRATORY_TRACT
  Filled 2017-10-11: qty 28

## 2017-10-11 MED ORDER — MONTELUKAST SODIUM 10 MG PO TABS
10.0000 mg | ORAL_TABLET | ORAL | Status: DC
  Administered 2017-10-11 – 2017-10-12 (×2): 10 mg via ORAL
  Filled 2017-10-11 (×2): qty 1

## 2017-10-11 MED ORDER — FERROUS GLUCONATE 324 (38 FE) MG PO TABS
324.0000 mg | ORAL_TABLET | Freq: Every day | ORAL | Status: DC
  Administered 2017-10-11 – 2017-10-12 (×2): 324 mg via ORAL
  Filled 2017-10-11 (×2): qty 1

## 2017-10-11 MED ORDER — ACETAMINOPHEN 650 MG RE SUPP: 650.0000 mg | RECTAL | Status: DC | PRN

## 2017-10-11 MED ORDER — SENNOSIDES-DOCUSATE SODIUM 8.6-50 MG PO TABS: 1.0000 | ORAL_TABLET | Freq: Every evening | ORAL | Status: DC | PRN

## 2017-10-11 MED ORDER — LOPERAMIDE HCL 2 MG PO CAPS: 2.0000 mg | ORAL_CAPSULE | Freq: Four times a day (QID) | ORAL | Status: DC | PRN

## 2017-10-11 MED ORDER — ORAL CARE MOUTH RINSE
15.0000 mL | Freq: Two times a day (BID) | OROMUCOSAL | Status: DC
  Administered 2017-10-11 – 2017-10-12 (×3): 15 mL via OROMUCOSAL

## 2017-10-11 MED ORDER — ASPIRIN 81 MG PO CHEW
81.0000 mg | CHEWABLE_TABLET | Freq: Every day | ORAL | Status: DC
  Administered 2017-10-11 – 2017-10-12 (×2): 81 mg via ORAL
  Filled 2017-10-11 (×2): qty 1

## 2017-10-11 MED ORDER — BACLOFEN 10 MG PO TABS
10.0000 mg | ORAL_TABLET | Freq: Every day | ORAL | Status: DC
  Administered 2017-10-11: 10 mg via ORAL
  Filled 2017-10-11 (×2): qty 1

## 2017-10-11 MED ORDER — FENOFIBRATE 160 MG PO TABS
160.0000 mg | ORAL_TABLET | Freq: Every day | ORAL | Status: DC
  Administered 2017-10-11 – 2017-10-12 (×2): 160 mg via ORAL
  Filled 2017-10-11 (×2): qty 1

## 2017-10-11 MED ORDER — CITALOPRAM HYDROBROMIDE 20 MG PO TABS
20.0000 mg | ORAL_TABLET | ORAL | Status: DC
  Administered 2017-10-11 – 2017-10-12 (×2): 20 mg via ORAL
  Filled 2017-10-11 (×2): qty 1

## 2017-10-11 MED ORDER — ENOXAPARIN SODIUM 40 MG/0.4ML ~~LOC~~ SOLN
40.0000 mg | SUBCUTANEOUS | Status: DC
  Administered 2017-10-11: 21:00:00 40 mg via SUBCUTANEOUS
  Filled 2017-10-11: qty 0.4

## 2017-10-11 NOTE — Care Management Obs Status (Signed)
[facility] NOTIFICATION   Patient Details  Name: TASFIA VASSEUR MRN: [id number] Date of Birth: [birth year]   Medicare Observation Status Notification Given:   permission to x given     Shelbie Ammons, RN 10/11/2017, 9:18 AM

## 2017-10-11 NOTE — Consult Note (Signed)
Referring Physician: Vianne Bulls    Chief Complaint: Left hand weakness  HPI: Christine Morris is an 77 y.o. female who reports that at 11p last evening noted that when she bent over to pick something up from the floor.  At the same time she became dizzy.  Subsequently she and her partner noted slurred speech.  Later while asleep noted that her left hand was numb.  She also had difficulty picking up the sheet due to lack of dexterity in her left hand.  Patient presented for evaluation at that time.  Symptoms have resolved.   Initial NIHSS of 1  Date last known well: Date: 10/10/2017 Time last known well: Time: 23:00 tPA Given: No: Improvement in symptoms  Past Medical History:  Diagnosis Date  . COPD (chronic obstructive pulmonary disease) ([place])   . Vertigo     History reviewed. No pertinent surgical history.  Family history: Father deceased with HTN HLD and cirrhosis.  Mother deceased.  Social History:  reports that she has quit smoking. She has never used smokeless tobacco. Her alcohol and drug histories are not on file.  Allergies: No Known Allergies  Medications:  I have reviewed the patient's current medications. Prior to Admission:  Medications Prior to Admission  Medication Sig Dispense Refill Last Dose  . baclofen (LIORESAL) 10 MG tablet Take 10 mg by mouth at bedtime.   10/10/2017 at Unknown time  . BREO ELLIPTA 200-25 MCG/INH AEPB Inhale 1 puff into the lungs daily.   10/10/2017 at Unknown time  . calcitRIOL (ROCALTROL) 0.5 MCG capsule Take 0.5 mcg by mouth daily.   10/10/2017 at Unknown time  . citalopram (CELEXA) 20 MG tablet Take 20 mg by mouth every morning.   10/10/2017 at Unknown time  . fenofibrate 160 MG tablet Take 160 mg by mouth daily.   10/10/2017 at Unknown time  . ferrous gluconate (FERGON) 324 MG tablet Take 1 tablet by mouth daily with breakfast.   10/10/2017 at Unknown time  . INCRUSE ELLIPTA 62.5 MCG/INH AEPB Inhale 1 puff into the lungs daily.   10/10/2017 at  Unknown time  . levothyroxine (SYNTHROID, LEVOTHROID) 88 MCG tablet Take 88 mcg by mouth daily.   10/10/2017 at Unknown time  . montelukast (SINGULAIR) 10 MG tablet Take 10 mg by mouth every morning.   10/10/2017 at Unknown time  . simvastatin (ZOCOR) 40 MG tablet Take 40 mg by mouth daily.   10/10/2017 at Unknown time  . vitamin B-12 (CYANOCOBALAMIN) 1000 MCG tablet Take 1,000 mcg by mouth daily.   10/10/2017 at Unknown time   Scheduled: . aspirin  81 mg Oral Daily  . baclofen  10 mg Oral QHS  . calcitRIOL  0.5 mcg Oral Daily  . citalopram  20 mg Oral BH-q7a  . enoxaparin (LOVENOX) injection  40 mg Subcutaneous Q24H  . fenofibrate  160 mg Oral Daily  . ferrous gluconate  324 mg Oral Q breakfast  . fluticasone furoate-vilanterol  1 puff Inhalation Daily  . levothyroxine  88 mcg Oral QAC breakfast  . mouth rinse  15 mL Mouth Rinse BID  . montelukast  10 mg Oral BH-q7a  . simvastatin  40 mg Oral Daily  . sodium chloride flush  3 mL Intravenous Q12H  . umeclidinium bromide  1 puff Inhalation Daily  . vitamin B-12  1,000 mcg Oral Daily    ROS: History obtained from the patient  General ROS: negative for - chills, fatigue, fever, night sweats, weight gain or weight loss Psychological  ROS: negative for - behavioral disorder, hallucinations, memory difficulties, mood swings or suicidal ideation Ophthalmic ROS: negative for - blurry vision, double vision, eye pain or loss of vision ENT ROS: negative for - epistaxis, nasal discharge, oral lesions, sore throat, tinnitus or vertigo Allergy and Immunology ROS: negative for - hives or itchy/watery eyes Hematological and Lymphatic ROS: negative for - bleeding problems, bruising or swollen lymph nodes Endocrine ROS: negative for - galactorrhea, hair pattern changes, polydipsia/polyuria or temperature intolerance Respiratory ROS: negative for - cough, hemoptysis, shortness of breath or wheezing Cardiovascular ROS: negative for - chest pain, dyspnea on  exertion, edema or irregular heartbeat Gastrointestinal ROS: negative for - abdominal pain, diarrhea, hematemesis, nausea/vomiting or stool incontinence Genito-Urinary ROS: negative for - dysuria, hematuria, incontinence or urinary frequency/urgency Musculoskeletal ROS: negative for - joint swelling or muscular weakness Neurological ROS: as noted in HPI Dermatological ROS: negative for rash and skin lesion changes  Physical Examination: Blood pressure (!) 117/59, pulse (!) 55, temperature 98.4 F (36.9 C), temperature source Oral, resp. rate 18, height 5\' 3"  (1.6 m), weight 65.8 kg (145 lb), SpO2 97 %.  HEENT-  Normocephalic, no lesions, without obvious abnormality.  Normal external eye and conjunctiva.  Normal TM's bilaterally.  Normal auditory canals and external ears. Normal external nose, mucus membranes and septum.  Normal pharynx. Cardiovascular- S1, S2 normal, pulses palpable throughout   Lungs- chest clear, no wheezing, rales, normal symmetric air entry Abdomen- soft, non-tender; bowel sounds normal; no masses,  no organomegaly Extremities- no edema Lymph-no adenopathy palpable Musculoskeletal-no joint tenderness, deformity or swelling Skin-warm and dry, no hyperpigmentation, vitiligo, or suspicious lesions  Neurological Examination   Mental Status: Alert, oriented, thought content appropriate.  Speech fluent without evidence of aphasia.  Able to follow 3 step commands without difficulty. Cranial Nerves: II: Discs flat bilaterally; Visual fields grossly normal, pupils equal, round, reactive to light and accommodation III,IV, VI: ptosis not present, extra-ocular motions intact bilaterally V,VII: smile symmetric, facial light touch sensation normal bilaterally VIII: hearing normal bilaterally IX,X: gag reflex present XI: bilateral shoulder shrug XII: midline tongue extension Motor: Right : Upper extremity   5/5    Left:     Upper extremity   5/5  Lower extremity   5/5     Lower  extremity   5/5 Tone and bulk:normal tone throughout; no atrophy noted Sensory: Pinprick and light touch intact throughout, bilaterally Deep Tendon Reflexes: + and symmetric throughout with absent AJ's bilaterally Plantars: Right: downgoing   Left: downgoing Cerebellar: Normal finger-to-nose and normal heel-to-shin testing bilaterally Gait: not tested due to safety concerns   Laboratory Studies:  Basic Metabolic Panel: Recent Labs  Lab 10/11/17 0122  NA 141  K 3.5  CL 104  CO2 30  GLUCOSE 102*  BUN 21*  CREATININE 0.79  CALCIUM 9.6    Liver Function Tests: Recent Labs  Lab 10/11/17 0122  AST 25  ALT 17  ALKPHOS 49  BILITOT 0.5  PROT 7.4  ALBUMIN 3.9   No results for input(s): LIPASE, AMYLASE in the last 168 hours. No results for input(s): AMMONIA in the last 168 hours.  CBC: Recent Labs  Lab 10/11/17 0122  WBC 10.9  NEUTROABS 7.4*  HGB 12.2  HCT 37.1  MCV 90.7  PLT 329    Cardiac Enzymes: Recent Labs  Lab 10/11/17 0122  TROPONINI <0.03    BNP: Invalid input(s): POCBNP  CBG: No results for input(s): GLUCAP in the last 168 hours.  Microbiology: No results found  for this or any previous visit.  Coagulation Studies: Recent Labs    10/11/17 0122  LABPROT 12.7  INR 0.96    Urinalysis:  Recent Labs  Lab 10/11/17 0223  COLORURINE YELLOW*  LABSPEC 1.013  PHURINE 6.0  GLUCOSEU NEGATIVE  HGBUR SMALL*  BILIRUBINUR NEGATIVE  KETONESUR NEGATIVE  PROTEINUR NEGATIVE  NITRITE POSITIVE*  LEUKOCYTESUR SMALL*    Lipid Panel:    Component Value Date/Time   CHOL 154 10/11/2017 0122   TRIG 132 10/11/2017 0122   HDL 63 10/11/2017 0122   CHOLHDL 2.4 10/11/2017 0122   VLDL 26 10/11/2017 0122   LDLCALC 65 10/11/2017 0122    HgbA1C:  Lab Results  Component Value Date   HGBA1C 5.5 10/11/2017    Urine Drug Screen:  No results found for: LABOPIA, COCAINSCRNUR, LABBENZ, AMPHETMU, THCU, LABBARB  Alcohol Level: No results for input(s): ETH in  the last 168 hours.  Other results: EKG: sinus rhythm at 58 bpm.  Imaging: Ct Head Wo Contrast  Result Date: 10/11/2017 CLINICAL DATA:  Slurred speech and weakness of the left hand. EXAM: CT HEAD WITHOUT CONTRAST TECHNIQUE: Contiguous axial images were obtained from the base of the skull through the vertex without intravenous contrast. COMPARISON:  None. FINDINGS: BRAIN: There is sulcal and ventricular prominence consistent with superficial and central atrophy. No intraparenchymal hemorrhage, mass effect nor midline shift. Periventricular and subcortical white matter hypodensities consistent with chronic small vessel ischemic disease are identified. No acute large vascular territory infarcts. No abnormal extra-axial fluid collections. Basal cisterns are not effaced and midline. VASCULAR: Moderate calcific atherosclerosis of the carotid siphons. SKULL: No skull fracture. No significant scalp soft tissue swelling. SINUSES/ORBITS: The mastoid air-cells are clear. The included paranasal sinuses are well-aerated.The included ocular globes and orbital contents are non-suspicious. OTHER: None. IMPRESSION: Atrophy with moderate chronic appearing small vessel ischemic disease. No acute intracranial abnormality. Electronically Signed   By: Ashley Royalty M.D.   On: 10/11/2017 02:08   Mr Brain Wo Contrast  Result Date: 10/11/2017 CLINICAL DATA:  TIA, initial exam. Slurred speech and weakness of the left hand EXAM: MRI HEAD WITHOUT CONTRAST MRA HEAD WITHOUT CONTRAST TECHNIQUE: Multiplanar, multiecho pulse sequences of the brain and surrounding structures were obtained without intravenous contrast. Angiographic images of the head were obtained using MRA technique without contrast. COMPARISON:  Head CT from earlier today FINDINGS: MRI HEAD FINDINGS Brain: Small, mainly subcentimeter, patchy restricted diffusion along the right frontal parietal cortex, the right centrum semiovale, and right caudate head. Negative for  hemorrhage. Cerebral volume loss with ventriculomegaly. Mild chronic small vessel ischemia in the cerebral white matter. No masslike finding, hydrocephalus, or collection. Vascular: Major flow voids are preserved, MRA noted below. Skull and upper cervical spine: No focal marrow lesion. Upper cervical facet arthropathy with mild C2-3 and C3-4 anterolisthesis. 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 No flow limiting stenosis in the neck by carotid ultrasound. No branch occlusion, beading, or flow limiting stenosis. The vertebral arteries are symmetric and smooth. Basilar is smooth and diffusely patent. Robust flow in bilateral cerebellar and posterior cerebral arteries. IMPRESSION: 1. 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 2. No acute finding or stenosis on intracranial MRA. 3. Aplastic right A1 segment. 4. Cerebral volume loss and mild chronic small vessel ischemia. Electronically Signed   By: Monte Fantasia M.D.   On: 10/11/2017 11:31   US  Carotid Bilateral (at [facility] And [facility] Only)  Result Date: 10/11/2017 CLINICAL DATA:  TIA. Hypertension, hyperlipidemia, previous tobacco abuse EXAM: BILATERAL CAROTID DUPLEX ULTRASOUND TECHNIQUE: Pearline Cables scale imaging, color Doppler and duplex ultrasound was performed of bilateral carotid and vertebral arteries in the neck. COMPARISON:  None. TECHNIQUE: Quantification of carotid stenosis is based on velocity parameters that correlate the residual internal carotid diameter with NASCET-based stenosis levels, using the diameter of the distal internal carotid lumen as the denominator for stenosis measurement. The following velocity measurements were obtained: PEAK SYSTOLIC/END DIASTOLIC RIGHT ICA:                     127/38cm/sec CCA:                     11/65BX/UXY SYSTOLIC ICA/CCA RATIO:  1.8 ECA:                     121cm/sec LEFT ICA:                      71/19cm/sec CCA:                     33/38VA/NVB SYSTOLIC ICA/CCA RATIO:  0.9 ECA:                     132cm/sec FINDINGS: RIGHT CAROTID ARTERY: Irregular calcified plaque in the bulb and proximal ICA resulting in at least mild stenosis. Normal waveforms and color Doppler signal throughout however. Mild tortuosity. RIGHT VERTEBRAL ARTERY:  Normal flow direction and waveform. LEFT CAROTID ARTERY: Scattered calcified plaque in the common carotid artery and bulb extending proximal internal and external carotid arteries. No high-grade stenosis encounter. Normal waveforms and color Doppler signal throughout. LEFT VERTEBRAL ARTERY: Normal flow direction and waveform. IMPRESSION: 1. Bilateral carotid bifurcation and proximal ICA plaque, resulting in less than 50% diameter stenosis. 2.  Antegrade bilateral vertebral arterial flow. Electronically Signed   By: Lucrezia Europe M.D.   On: 10/11/2017 10:26   Dg Chest Port 1 View  Result Date: 10/11/2017 CLINICAL DATA:  Initial evaluation for acute left-sided weakness, slurred speech. EXAM: PORTABLE CHEST 1 VIEW COMPARISON:  Prior CT from 09/29/2012. FINDINGS: Mild accentuation of the cardiac silhouette related AP technique. Heart size felt to be within normal limits. Mediastinal silhouette normal. Aortic atherosclerosis. Lungs normally inflated. No focal infiltrates. No pulmonary edema or pleural effusion. No pneumothorax. Mild linear scarring and/or atelectasis at the left lung base. No acute osseus abnormality. IMPRESSION: 1. Mild left basilar atelectasis and/or scarring. 2. No other active cardiopulmonary disease. 3. Aortic atherosclerosis. Electronically Signed   By: Jeannine Boga M.D.   On: 10/11/2017 02:55   Mr Jodene Nam Head/brain TY Cm  Result Date: 10/11/2017 CLINICAL DATA:  TIA, initial exam. Slurred speech and weakness of the left hand EXAM: MRI HEAD WITHOUT CONTRAST MRA HEAD WITHOUT CONTRAST TECHNIQUE: Multiplanar, multiecho pulse sequences of the brain  and surrounding structures were obtained without intravenous contrast. Angiographic images of the head were obtained using MRA technique without contrast. COMPARISON:  Head CT from earlier today FINDINGS: MRI HEAD FINDINGS Brain: Small, mainly subcentimeter, patchy restricted diffusion along the right frontal parietal cortex, the right centrum semiovale, and right caudate head. Negative for hemorrhage. Cerebral volume loss with ventriculomegaly. Mild chronic small vessel ischemia in the cerebral white matter. No masslike finding, hydrocephalus, or collection. Vascular: Major flow voids are preserved, MRA noted below. Skull and upper cervical spine:  No focal marrow lesion. Upper cervical facet arthropathy with mild C2-3 and C3-4 anterolisthesis. 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 No flow limiting stenosis in the neck by carotid ultrasound. No branch occlusion, beading, or flow limiting stenosis. The vertebral arteries are symmetric and smooth. Basilar is smooth and diffusely patent. Robust flow in bilateral cerebellar and posterior cerebral arteries. IMPRESSION: 1. 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 2. No acute finding or stenosis on intracranial MRA. 3. Aplastic right A1 segment. 4. Cerebral volume loss and mild chronic small vessel ischemia. Electronically Signed   By: Monte Fantasia M.D.   On: 10/11/2017 11:31    Assessment: 77 y.o. female presenting with left hand weakness.  Symptoms now resolved.  MRI of the brain reviewed and shows patchy, small acute infarcts in the right MCA territory.  Embolic etiology suspected.  Patient on ASA prior to admission.  Carotid dopplers show no evidence of hemodynamically significant stenosis.  Echocardiogram shows no cardiac source of emboli with an EF of 55-60%.  A1c 5.5, LDL 65.  Stroke Risk Factors - none  Plan: 1.  Plavix 75mg  daily 2. Telemetry monitoring 3. Frequent neuro checks 4. Consider prolonged cardiac monitoring on an outpatient basis to increase probability of detecting arhythmia such as afib.     Alexis Goodell, MD Neurology [phone] 10/11/2017, 3:40 PM

## 2017-10-11 NOTE — Evaluation (Addendum)
Clinical/Bedside Swallow Evaluation Patient Details  Name: Christine Morris MRN: [id number] Date of Birth: July 21, 1940  Today's Date: 10/11/2017 Time: SLP Start Time (ACUTE ONLY): 0930 SLP Stop Time (ACUTE ONLY): 1030 SLP Time Calculation (min) (ACUTE ONLY): 60 min  Past Medical History:  Past Medical History:  Diagnosis Date  . COPD (chronic obstructive pulmonary disease) ([place])   . Vertigo    Past Surgical History: History reviewed. No pertinent surgical history. HPI:    Patient with past medical history of COPD presents to the emergency department complaining of left hand weakness.  The patient does not know the onset of her symptoms that she had been asleep when she awoke to adjust her covers and noticed that her left hand was numb.  She also had difficulty picking up the sheet due to lack of dexterity in her left hand.  Patient is also noticed that her speech is slurred.  In the emergency department CT of her head showed no acute intracranial abnormality.  Her speech has improved but is still slurred and her sensory changes have also improved.  Due to new onset neurologic symptoms emergency department staff called the hospitalist service for admission. 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    Assessment / Plan / Recommendation Clinical Impression  Pt appears to present w/ adequate oropharyngeal phase swallow function and at reduced risk for aspiration when following general aspiration precautions. She appears to adequately tolerate trials of thin liquids, purees, soft solids w/ no overt s/s of aspiration noted; no gross oropharyngeal phase dysphagia apparent when following general aspiration precautions. Vocal quality was clear b/t trials w/ no decline in respiratory status post trials. Oral clearing WFL w/ time for mastication/gumming of foods d/t edentulous status.  Pt appears at reduced risk for  aspiration when following general aspiration precautions. MD consulted and agreed w/ a mech soft diet w/ well-chopped meats, moistened foods secondary to her Edentulous status. Recommend Pills in PUREE for safer, easier swallowing. Tray setup d/t LUE weakness/sensation loss.  SLP Visit Diagnosis: Dysphagia, unspecified (R13.10)    Aspiration Risk  (reduced when following general aspiration precautions)    Diet Recommendation  Dysphagia level 3(minced meats, gravy added to moisten); Thin liquids. General aspiration precautions.   Medication Administration: Whole meds with puree(for safer swallowing)    Other  Recommendations Recommended Consults: (n/a) Oral Care Recommendations: Oral care BID;Patient independent with oral care Other Recommendations: (n/a)   Follow up Recommendations None      Frequency and Duration min 2x/week (if needed while admitted) 1 week       Prognosis Prognosis for Safe Diet Advancement: Good Barriers to Reach Goals: (n/a)      Swallow Study   General Date of Onset: 10/11/17 Type of Study: Bedside Swallow Evaluation Previous Swallow Assessment: none Diet Prior to this Study: Regular;Thin liquids(soft foods d/t lacking dentition) Temperature Spikes Noted: No(wbc 10.9) Respiratory Status: Nasal cannula(3 liters) History of Recent Intubation: No Behavior/Cognition: Alert;Cooperative;Pleasant mood(min mild proprioceptiveness) Oral Cavity Assessment: Within Functional Limits Oral Care Completed by SLP: Recent completion by staff Oral Cavity - Dentition: Edentulous Vision: Functional for self-feeding Self-Feeding Abilities: Able to feed self;Needs set up(LUE sensation/weakness) Patient Positioning: Upright in bed Baseline Vocal Quality: Normal Volitional Cough: Strong Volitional Swallow: Able to elicit    Oral/Motor/Sensory Function Overall Oral Motor/Sensory Function: Within functional limits(grossly)   Ice Chips Ice chips: Within functional  limits Presentation: Spoon(3 trials)   Thin  Liquid Thin Liquid: Within functional limits Presentation: Cup;Self Fed(~4 ozs total)    Nectar Thick Nectar Thick Liquid: Not tested   Honey Thick Honey Thick Liquid: Not tested   Puree Puree: Within functional limits Presentation: Spoon(fed; 5 trials)   Solid   GO   Solid: Impaired(mech soft trials) Presentation: Spoon(fed; 5 trials) Oral Phase Impairments: Impaired mastication(edendutlous) Oral Phase Functional Implications: Impaired mastication(edentulous) Pharyngeal Phase Impairments: (none)         Orinda Kenner, MS, CCC-SLP Watson,Katherine 10/11/2017,10:53 AM

## 2017-10-11 NOTE — Care Management Note (Signed)
Case Management Note  Patient Details  Name: [name] Date of Birth: 17-Jan-1941  Subjective/Objective:   Admitted to [facility] under observation status with the diagnosis of TIA. Lives with friend, Ferd Hibbs [phone]). Goes to [facility] in [place] for physician services. Last was at [facility] in January 2019. Prescriptions are filled at [facility] in [place]. No home health. No skilled facility. Home oxygen for several years. Unsure who the company is. Uses 3 liters continuous.  Shower chair and rolling walker in the home. Takes care of all basic activities of daily living herself, can drive if need.    No falls. Good appetite. Friend will transport              Action/Plan: Physical therapy evaluation completed. No needs.   Expected Discharge Date:  10/12/17               Expected Discharge Plan:     In-House Referral:     Discharge planning Services     Post Acute Care Choice:    Choice offered to:     DME Arranged:    DME Agency:     HH Arranged:    HH Agency:     Status of Service:     If discussed at [employer], dates discussed:    Additional Comments:  IMA HAFNER, RN MSN CCM Care Management [phone] 10/11/2017, 9:20 AM

## 2017-10-11 NOTE — Progress Notes (Signed)
Family Meeting Note  Advance Directive:yes  Today a meeting took place with the Patient.  Wanted to be full code.  Discussed about diagnosis of acute stroke.  The following clinical team members were present during this meeting:MD  Discussed about her previous diagnosis of hypertension, COPD, now with acute stroke.  See how she does. Additional follow-up to be provided:   Time spent during discussion:15 minutes  Epifanio Lesches, MD

## 2017-10-11 NOTE — Evaluation (Signed)
Physical Therapy Evaluation Patient Details Name: Christine Morris MRN: [id number] DOB: Jun 16, 1940 Today's Date: 10/11/2017   History of Present Illness  Christine Morris is a 77yo white female PMH: COPD on 3L chronic at home, comes to [facility] on 5/19 d/t Left hand numbness and slurred speech. Pt denies paresthesia of the face arm or legs. Pt reports balance deficits at baseline with chronic RW use, but no acute gait instability or leg weakness. Pt reports intermittent paresthesia in LUE x2-3 weeks, typically associated with sleeping. Since arrival speech and Left hand symptoms have improved but are still evident.   Clinical Impression  Pt received in bed, awake, agreeable to participate. Pt denies any paresthesia outside of the Left hand, denies any involvement of either leg associated with this episode. Pt refuses any acute changes to balance and mobility. Examination reveals symmetrical strength and sensation in bilat lower extremity. Patient is at baseline for basic mobility, all education completed, and time is given to address all questions/concerns. No additional skilled PT services needed at this time, PT signing off. PT recommends daily ambulation ad lib or with nursing staff as needed to prevent deconditioning.      Follow Up Recommendations No PT follow up    Equipment Recommendations  None recommended by PT    Recommendations for Other Services       Precautions / Restrictions Precautions Precautions: None      Mobility  Bed Mobility               General bed mobility comments: not assessed at this time.   Transfers                    Ambulation/Gait                Stairs            Wheelchair Mobility    Modified Rankin (Stroke Patients Only)       Balance Overall balance assessment: (reports baseline balance deficits, no falls in about 3 years; reports being 'careful' and uses RW in and out of home. )                                            Pertinent Vitals/Pain Pain Assessment: No/denies pain    Home Living Family/patient expects to be discharged to:: Private residence Living Arrangements: Spouse/significant other Available Help at Discharge: Family Type of Home: Mobile home Home Access: Stairs to enter Entrance Stairs-Rails: Psychiatric nurse of Steps: 4 Home Layout: One level Home Equipment: Clinical cytogeneticist - 2 wheels      Prior Function Level of Independence: Independent with assistive device(s)         Comments: chronic O2 and RW in and out of home, exits home for medical appoitnemnts and family gatherings only, estimated 1-3x monthly.      Hand Dominance   Dominant Hand: Right    Extremity/Trunk Assessment        Lower Extremity Assessment Lower Extremity Assessment: (bilat ankle DF/PF 5/5; hip abduction MMT 4+/5 bilat; sensation intact, symmetrical in BLE. )       Communication   Communication: Expressive difficulties(slurred speech since arrival )  Cognition Arousal/Alertness: Awake/alert Behavior During Therapy: WFL for tasks assessed/performed Overall Cognitive Status: Within Functional Limits for tasks assessed  General Comments      Exercises     Assessment/Plan    PT Assessment Patent does not need any further PT services  PT Problem List         PT Treatment Interventions      PT Goals (Current goals can be found in the Care Plan section)  Acute Rehab PT Goals PT Goal Formulation: All assessment and education complete, DC therapy    Frequency     Barriers to discharge        Co-evaluation               AM-PAC PT "6 Clicks" Daily Activity  Outcome Measure Difficulty turning over in bed (including adjusting bedclothes, sheets and blankets)?: A Little Difficulty moving from lying on back to sitting on the side of the bed? : A Little Difficulty sitting down on  and standing up from a chair with arms (e.g., wheelchair, bedside commode, etc,.)?: A Little Help needed moving to and from a bed to chair (including a wheelchair)?: A Little Help needed walking in hospital room?: A Little Help needed climbing 3-5 steps with a railing? : A Little 6 Click Score: 18    End of Session Equipment Utilized During Treatment: Oxygen Activity Tolerance: Patient tolerated treatment well Patient left: in bed(RNCM in room at end of session) Nurse Communication: Mobility status PT Visit Diagnosis: Other symptoms and signs involving the nervous system (R29.898)    Time: 6237-6283 PT Time Calculation (min) (ACUTE ONLY): 7 min   Charges:   PT Evaluation $PT Eval Moderate Complexity: 1 Mod     PT G Codes:        9:25 AM, 11-01-2017 Etta Grandchild, PT, DPT Physical Therapist - [facility]  [phone] ([place])    [place] C November 01, 2017, 9:23 AM

## 2017-10-11 NOTE — Progress Notes (Signed)
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 Speech slightly slurred. Slight weakness of left hand/grip. Speech consult completed with diet modified/pills whole in applesauce. Neurochecks and VSS. Up to BR with SB+ with SOB on exertion which resolves with rest. Pt has chronic 02/@3l /[place]. Denies co's and states parathesia in left hand improving. Husband visiting at bedside. Requested advanced directives.

## 2017-10-11 NOTE — ED Triage Notes (Signed)
Pt arrived via EMS from home with reports of left sided hand weakness and slurred speech. Pt is A&O x4 with no drift, facial droop but has slurred speech and has weak strength in the left hand.

## 2017-10-11 NOTE — H&P (Signed)
Christine Morris is an 77 y.o. female.   Chief Complaint: Weakness HPI: The patient with past medical history of COPD presents to the emergency department complaining of left hand weakness.  The patient does not know the onset of her symptoms that she had been asleep when she awoke to adjust her covers and noticed that her left hand was numb.  She also had difficulty picking up the sheet due to lack of dexterity in her left hand.  Patient is also noticed that her speech is slurred.  In the emergency department CT of her head showed no acute intracranial abnormality.  Her speech has improved but is still slurred and her sensory changes have also improved.  Due to new onset neurologic symptoms emergency department staff called the hospitalist service for admission.  Past Medical History:  Diagnosis Date  . COPD (chronic obstructive pulmonary disease) ([place])   . Vertigo     History reviewed. No pertinent surgical history. Orthopedic surgery  History reviewed. No pertinent family history. HTN in multiple family members  Social History:  reports that she has quit smoking. She has never used smokeless tobacco. Her alcohol and drug histories are not on file.  Allergies: No Known Allergies  Medications Prior to Admission  Medication Sig Dispense Refill  . baclofen (LIORESAL) 10 MG tablet Take 10 mg by mouth at bedtime.    Marland Kitchen BREO ELLIPTA 200-25 MCG/INH AEPB Inhale 1 puff into the lungs daily.    . calcitRIOL (ROCALTROL) 0.5 MCG capsule Take 0.5 mcg by mouth daily.    . citalopram (CELEXA) 20 MG tablet Take 20 mg by mouth every morning.    . fenofibrate 160 MG tablet Take 160 mg by mouth daily.    . ferrous gluconate (FERGON) 324 MG tablet Take 1 tablet by mouth daily with breakfast.    . INCRUSE ELLIPTA 62.5 MCG/INH AEPB Inhale 1 puff into the lungs daily.    Marland Kitchen levothyroxine (SYNTHROID, LEVOTHROID) 88 MCG tablet Take 88 mcg by mouth daily.    . montelukast (SINGULAIR) 10 MG tablet Take 10 mg by  mouth every morning.    . simvastatin (ZOCOR) 40 MG tablet Take 40 mg by mouth daily.    . vitamin B-12 (CYANOCOBALAMIN) 1000 MCG tablet Take 1,000 mcg by mouth daily.      Results for orders placed or performed during the hospital encounter of 10/11/17 (from the past 48 hour(s))  Protime-INR     Status: None   Collection Time: 10/11/17  1:22 AM  Result Value Ref Range   Prothrombin Time 12.7 11.4 - 15.2 seconds   INR 0.96     Comment: Performed at [facility], [address] [place], [place] [address]  APTT     Status: None   Collection Time: 10/11/17  1:22 AM  Result Value Ref Range   aPTT 36 24 - 36 seconds    Comment: Performed at [facility], [address] [place],  [address]  CBC     Status: None   Collection Time: 10/11/17  1:22 AM  Result Value Ref Range   WBC 10.9 3.6 - 11.0 K/uL   RBC 4.09 3.80 - 5.20 MIL/uL   Hemoglobin 12.2 12.0 - 16.0 g/dL   HCT 37.1 35.0 - 47.0 %   MCV 90.7 80.0 - 100.0 fL   MCH 29.9 26.0 - 34.0 pg   MCHC 33.0 32.0 - 36.0 g/dL   RDW 14.3 11.5 - 14.5 %   Platelets 329  150 - 440 K/uL    Comment: Performed at [facility], [address] [place], [place] [address]  Differential     Status: Abnormal   Collection Time: 10/11/17  1:22 AM  Result Value Ref Range   Neutrophils Relative % 68 %   Neutro Abs 7.4 (H) 1.4 - 6.5 K/uL   Lymphocytes Relative 16 %   Lymphs Abs 1.7 1.0 - 3.6 K/uL   Monocytes Relative 6 %   Monocytes Absolute 0.7 0.2 - 0.9 K/uL   Eosinophils Relative 9 %   Eosinophils Absolute 0.9 (H) 0 - 0.7 K/uL   Basophils Relative 1 %   Basophils Absolute 0.1 0 - 0.1 K/uL    Comment: Performed at [facility], [address] [place], [place] [address]  Comprehensive metabolic panel     Status: Abnormal   Collection Time: 10/11/17  1:22 AM  Result Value Ref Range   Sodium 141 135 - 145 mmol/L   Potassium 3.5 3.5 - 5.1 mmol/L   Chloride 104 101 - 111 mmol/L   CO2 30 22 - 32 mmol/L    Glucose, Bld 102 (H) 65 - 99 mg/dL   BUN 21 (H) 6 - 20 mg/dL   Creatinine, Ser 0.79 0.44 - 1.00 mg/dL   Calcium 9.6 8.9 - 10.3 mg/dL   Total Protein 7.4 6.5 - 8.1 g/dL   Albumin 3.9 3.5 - 5.0 g/dL   AST 25 15 - 41 U/L   ALT 17 14 - 54 U/L   Alkaline Phosphatase 49 38 - 126 U/L   Total Bilirubin 0.5 0.3 - 1.2 mg/dL   GFR calc non Af Amer >60 >60 mL/min   GFR calc Af Amer >60 >60 mL/min    Comment: (NOTE) The eGFR has been calculated using the CKD EPI equation. This calculation has not been validated in all clinical situations. eGFR's persistently <60 mL/min signify possible Chronic Kidney Disease.    Anion gap 7 5 - 15    Comment: Performed at [facility], [address] [place], [place] [address]  Troponin I     Status: None   Collection Time: 10/11/17  1:22 AM  Result Value Ref Range   Troponin I <0.03 <0.03 ng/mL    Comment: Performed at [facility], [address] [place], [place] [address]  Lactic acid, plasma     Status: None   Collection Time: 10/11/17  2:23 AM  Result Value Ref Range   Lactic Acid, Venous 1.2 0.5 - 1.9 mmol/L    Comment: Performed at [facility], [address] [place], [place] [address]  Urinalysis, Complete w Microscopic     Status: Abnormal   Collection Time: 10/11/17  2:23 AM  Result Value Ref Range   Color, Urine YELLOW (A) YELLOW   APPearance HAZY (A) CLEAR   Specific Gravity, Urine 1.013 1.005 - 1.030   pH 6.0 5.0 - 8.0   Glucose, UA NEGATIVE NEGATIVE mg/dL   Hgb urine dipstick SMALL (A) NEGATIVE   Bilirubin Urine NEGATIVE NEGATIVE   Ketones, ur NEGATIVE NEGATIVE mg/dL   Protein, ur NEGATIVE NEGATIVE mg/dL   Nitrite POSITIVE (A) NEGATIVE   Leukocytes, UA SMALL (A) NEGATIVE   RBC / HPF 21-50 0 - 5 RBC/hpf   WBC, UA 21-50 0 - 5 WBC/hpf   Bacteria, UA RARE (A) NONE SEEN   Squamous Epithelial / LPF 0-5 0 - 5   Mucus PRESENT     Comment: Performed at [employer]  [facility], [place],  [place] [address]   Ct Head Wo Contrast  Result Date: 10/11/2017 CLINICAL DATA:  Slurred speech and weakness of the left hand. EXAM: CT HEAD WITHOUT CONTRAST TECHNIQUE: Contiguous axial images were obtained from the base of the skull through the vertex without intravenous contrast. COMPARISON:  None. FINDINGS: BRAIN: There is sulcal and ventricular prominence consistent with superficial and central atrophy. No intraparenchymal hemorrhage, mass effect nor midline shift. Periventricular and subcortical white matter hypodensities consistent with chronic small vessel ischemic disease are identified. No acute large vascular territory infarcts. No abnormal extra-axial fluid collections. Basal cisterns are not effaced and midline. VASCULAR: Moderate calcific atherosclerosis of the carotid siphons. SKULL: No skull fracture. No significant scalp soft tissue swelling. SINUSES/ORBITS: The mastoid air-cells are clear. The included paranasal sinuses are well-aerated.The included ocular globes and orbital contents are non-suspicious. OTHER: None. IMPRESSION: Atrophy with moderate chronic appearing small vessel ischemic disease. No acute intracranial abnormality. Electronically Signed   By: Ashley Royalty M.D.   On: 10/11/2017 02:08   Dg Chest Port 1 View  Result Date: 10/11/2017 CLINICAL DATA:  Initial evaluation for acute left-sided weakness, slurred speech. EXAM: PORTABLE CHEST 1 VIEW COMPARISON:  Prior CT from 09/29/2012. FINDINGS: Mild accentuation of the cardiac silhouette related AP technique. Heart size felt to be within normal limits. Mediastinal silhouette normal. Aortic atherosclerosis. Lungs normally inflated. No focal infiltrates. No pulmonary edema or pleural effusion. No pneumothorax. Mild linear scarring and/or atelectasis at the left lung base. No acute osseus abnormality. IMPRESSION: 1. Mild left basilar atelectasis and/or scarring. 2. No other active cardiopulmonary disease. 3. Aortic atherosclerosis. Electronically  Signed   By: Jeannine Boga M.D.   On: 10/11/2017 02:55    Review of Systems  Constitutional: Negative for chills and fever.  HENT: Negative for sore throat and tinnitus.   Eyes: Negative for blurred vision and redness.  Respiratory: Negative for cough and shortness of breath.   Cardiovascular: Negative for chest pain, palpitations, orthopnea and PND.  Gastrointestinal: Negative for abdominal pain, diarrhea, nausea and vomiting.  Genitourinary: Negative for dysuria, frequency and urgency.  Musculoskeletal: Negative for joint pain and myalgias.  Skin: Negative for rash.       No lesions  Neurological: Positive for speech change and weakness. Negative for focal weakness.  Endo/Heme/Allergies: Does not bruise/bleed easily.       No temperature intolerance  Psychiatric/Behavioral: Negative for depression and suicidal ideas.    Blood pressure (!) 145/61, pulse (!) 59, temperature 98 F (36.7 C), temperature source Oral, resp. rate 20, height '5\' 3"'  (1.6 m), weight 65.8 kg (145 lb), SpO2 97 %. Physical Exam  Vitals reviewed. Constitutional: She is oriented to person, place, and time. She appears well-developed and well-nourished. No distress.  HENT:  Head: Normocephalic and atraumatic.  Mouth/Throat: Oropharynx is clear and moist.  Eyes: Pupils are equal, round, and reactive to light. Conjunctivae and EOM are normal. No scleral icterus.  Neck: Normal range of motion. Neck supple. No JVD present. No tracheal deviation present. No thyromegaly present.  Cardiovascular: Normal rate, regular rhythm and normal heart sounds. Exam reveals no gallop and no friction rub.  No murmur heard. Respiratory: Effort normal and breath sounds normal. No respiratory distress.  GI: Soft. Bowel sounds are normal. She exhibits no distension. There is no tenderness.  Genitourinary:  Genitourinary Comments: Deferred  Musculoskeletal: Normal range of motion. She exhibits no edema.  Lymphadenopathy:    She  has no cervical adenopathy.  Neurological: She is alert and oriented to person, place, and time. No cranial nerve deficit.  Skin: Skin is warm and dry. No rash noted. No erythema.  Psychiatric: She has a normal mood and affect. Her behavior is normal. Judgment and thought content normal.     Assessment/Plan This is a 77 year old female admitted for TIA. 1.  TIA: MRI/MRA of the brain scheduled; obtain carotid ultrasounds as well as echocardiogram to rule out embolic source.  Consult neurology.  Start aspirin 2.  Hyperlipidemia: Continue statin therapy and fenofibrate.  Obtain lipid panel 3.  COPD: Stable; continue inhaled corticosteroid as well as long-acting bronchial agonist 4.  Hypothyroidism: Check TSH; continue Synthroid 5.  Depression: Continue Celexa 6.  DVT prophylaxis: Lovenox 7.  GI prophylaxis: None The patient is a full code.  Time spent on admission orders and patient care approximately 45 minutes  Harrie Foreman, MD 10/11/2017, 7:53 AM

## 2017-10-11 NOTE — Progress Notes (Signed)
Spoke with Speech Therapist, Curt Bears via telephone call regarding Speech Therapy Evaluation ordered for pt who is currently NPO; Curt Bears verbalized that she will come in to do the evaluation

## 2017-10-11 NOTE — ED Provider Notes (Signed)
[facility] Emergency Department Provider Note   ____________________________________________   First MD Initiated Contact with Patient 10/11/17 0136     (approximate)  I have reviewed the triage vital signs and the nursing notes.   HISTORY  Chief Complaint Weakness    HPI Christine Morris is a 77 y.o. female brought to the ED from home with a chief complaint of left hand weakness and slurred speech.  Patient noticed her hand was weak when she bent over to pick something up from the floor.  At the same time she became dizzy.  Subsequently she and her partner noted slurred speech.  States hand weakness has improved but slurred speech remains.  Patient is unable to tell me the time of onset of symptoms.  Denies recent fever, chills, chest pain, increased shortness of breath, abdominal pain, nausea, vomiting.  Patient wears 3 L continuous oxygen for COPD.  Denies recent travel or trauma.  Denies use of anticoagulants other than a daily baby aspirin.   Past Medical History:  Diagnosis Date  . COPD (chronic obstructive pulmonary disease) ([place])   . Vertigo     Patient Active Problem List   Diagnosis Date Noted  . TIA (transient ischemic attack) 10/11/2017     Prior to Admission medications   Medication Sig Start Date End Date Taking? Authorizing Provider  baclofen (LIORESAL) 10 MG tablet Take 10 mg by mouth at bedtime.   Yes [provider]  BREO ELLIPTA 200-25 MCG/INH AEPB Inhale 1 puff into the lungs daily.   Yes [provider]  calcitRIOL (ROCALTROL) 0.5 MCG capsule Take 0.5 mcg by mouth daily.   Yes [provider]  citalopram (CELEXA) 20 MG tablet Take 20 mg by mouth every morning.   Yes [provider]  fenofibrate 160 MG tablet Take 160 mg by mouth daily.   Yes [provider]  ferrous gluconate (FERGON) 324 MG tablet Take 1 tablet by mouth daily with breakfast.   Yes [provider]  INCRUSE  ELLIPTA 62.5 MCG/INH AEPB Inhale 1 puff into the lungs daily.   Yes [provider]  levothyroxine (SYNTHROID, LEVOTHROID) 88 MCG tablet Take 88 mcg by mouth daily.   Yes [provider]  montelukast (SINGULAIR) 10 MG tablet Take 10 mg by mouth every morning.   Yes [provider]  simvastatin (ZOCOR) 40 MG tablet Take 40 mg by mouth daily.   Yes [provider]  vitamin B-12 (CYANOCOBALAMIN) 1000 MCG tablet Take 1,000 mcg by mouth daily.   Yes [provider]    Allergies Patient has no known allergies.  History reviewed. No pertinent family history.  Social History Social History   Tobacco Use  . Smoking status: Former Research scientist (life sciences)  . Smokeless tobacco: Never Used  Substance Use Topics  . Alcohol use: Not on file  . Drug use: Not on file    Review of Systems  Constitutional: No fever/chills. Eyes: No visual changes. ENT: No sore throat. Cardiovascular: Denies chest pain. Respiratory: Denies shortness of breath. Gastrointestinal: No abdominal pain.  No nausea, no vomiting.  No diarrhea.  No constipation. Genitourinary: Negative for dysuria. Musculoskeletal: Negative for back pain. Skin: Negative for rash. Neurological: Negative for headache.  Positive for left hand weakness and slurred speech.    ____________________________________________   PHYSICAL EXAM:  VITAL SIGNS: ED Triage Vitals  Enc Vitals Group     BP 10/11/17 0121 (!) 84/68     Pulse Rate 10/11/17 0121 Marland Kitchen)  56     Resp 10/11/17 0121 17     Temp 10/11/17 0121 98.2 F (36.8 C)     Temp Source 10/11/17 0121 Oral     SpO2 10/11/17 0121 96 %     Weight 10/11/17 0117 145 lb (65.8 kg)     Height 10/11/17 0117 5\' 3"  (1.6 m)     Head Circumference --      Peak Flow --      Pain Score 10/11/17 0116 0     Pain Loc --      Pain Edu? --      Excl. in [place]? --     Constitutional: Alert and oriented. Well appearing and in no acute distress. Eyes: Conjunctivae are normal.  PERRL. EOMI. Head: Atraumatic. Nose: No congestion/rhinnorhea. Mouth/Throat: Mucous membranes are moist.  Edentulous.   Neck: No stridor.  No carotid bruits. Cardiovascular: Normal rate, regular rhythm. Grossly normal heart sounds.  Good peripheral circulation. Respiratory: Normal respiratory effort.  No retractions. Lungs CTAB. Gastrointestinal: Soft and nontender. No distention. No abdominal bruits. No CVA tenderness. Musculoskeletal: No lower extremity tenderness nor edema.  No joint effusions. Neurologic: Alert and oriented x3.  Slurred speech and language. MAEx4.  4/5 left hand strength.   Skin:  Skin is warm, dry and intact. No rash noted. Psychiatric: Mood and affect are normal. Speech and behavior are normal.  ____________________________________________   LABS (all labs ordered are listed, but only abnormal results are displayed)  Labs Reviewed  DIFFERENTIAL - Abnormal; Notable for the following components:      Result Value   Neutro Abs 7.4 (*)    Eosinophils Absolute 0.9 (*)    All other components within normal limits  COMPREHENSIVE METABOLIC PANEL - Abnormal; Notable for the following components:   Glucose, Bld 102 (*)    BUN 21 (*)    All other components within normal limits  URINALYSIS, COMPLETE (UACMP) WITH MICROSCOPIC - Abnormal; Notable for the following components:   Color, Urine YELLOW (*)    APPearance HAZY (*)    Hgb urine dipstick SMALL (*)    Nitrite POSITIVE (*)    Leukocytes, UA SMALL (*)    Bacteria, UA RARE (*)    All other components within normal limits  PROTIME-INR  APTT  CBC  TROPONIN I  LACTIC ACID, PLASMA  HEMOGLOBIN A1C  LIPID PANEL  CBG MONITORING, ED   ____________________________________________  EKG  ED ECG REPORT I, Waqas Bruhl J, the attending physician, personally viewed and interpreted this ECG.   Date: 10/11/2017  EKG Time: 0119  Rate: 58  Rhythm: normal EKG, normal sinus rhythm  Axis: Normal  Intervals:none  ST&T  Change: Nonspecific  ____________________________________________  RADIOLOGY  ED MD interpretation: No ICH, no acute cardiopulmonary process  Official radiology report(s): Ct Head Wo Contrast  Result Date: 10/11/2017 CLINICAL DATA:  Slurred speech and weakness of the left hand. EXAM: CT HEAD WITHOUT CONTRAST TECHNIQUE: Contiguous axial images were obtained from the base of the skull through the vertex without intravenous contrast. COMPARISON:  None. FINDINGS: BRAIN: There is sulcal and ventricular prominence consistent with superficial and central atrophy. No intraparenchymal hemorrhage, mass effect nor midline shift. Periventricular and subcortical white matter hypodensities consistent with chronic small vessel ischemic disease are identified. No acute large vascular territory infarcts. No abnormal extra-axial fluid collections. Basal cisterns are not effaced and midline. VASCULAR: Moderate calcific atherosclerosis of the carotid siphons. SKULL: No skull fracture. No significant scalp soft tissue swelling. SINUSES/ORBITS: The mastoid air-cells  are clear. The included paranasal sinuses are well-aerated.The included ocular globes and orbital contents are non-suspicious. OTHER: None. IMPRESSION: Atrophy with moderate chronic appearing small vessel ischemic disease. No acute intracranial abnormality. Electronically Signed   By: Ashley Royalty M.D.   On: 10/11/2017 02:08   Dg Chest Port 1 View  Result Date: 10/11/2017 CLINICAL DATA:  Initial evaluation for acute left-sided weakness, slurred speech. EXAM: PORTABLE CHEST 1 VIEW COMPARISON:  Prior CT from 09/29/2012. FINDINGS: Mild accentuation of the cardiac silhouette related AP technique. Heart size felt to be within normal limits. Mediastinal silhouette normal. Aortic atherosclerosis. Lungs normally inflated. No focal infiltrates. No pulmonary edema or pleural effusion. No pneumothorax. Mild linear scarring and/or atelectasis at the left lung base. No acute  osseus abnormality. IMPRESSION: 1. Mild left basilar atelectasis and/or scarring. 2. No other active cardiopulmonary disease. 3. Aortic atherosclerosis. Electronically Signed   By: Jeannine Boga M.D.   On: 10/11/2017 02:55    ____________________________________________   PROCEDURES  Procedure(s) performed:   NIH Stroke Scale  Interval: Baseline Time: 7:00 AM Person Administering Scale: Neely Cecena J  Administer stroke scale items in the order listed. Record performance in each category after each subscale exam. Do not go back and change scores. Follow directions provided for each exam technique. Scores should reflect what the patient does, not what the clinician thinks the patient can do. The clinician should record answers while administering the exam and work quickly. Except where indicated, the patient should not be coached (i.e., repeated requests to patient to make a special effort).   1a  Level of consciousness: 0=alert; keenly responsive  1b. LOC questions:  0=Performs both tasks correctly  1c. LOC commands: 0=Performs both tasks correctly  2.  Best Gaze: 0=normal  3.  Visual: 0=No visual loss  4. Facial Palsy: 0=Normal symmetric movement  5a.  Motor left arm: 0=No drift, limb holds 90 (or 45) degrees for full 10 seconds  5b.  Motor right arm: 0=No drift, limb holds 90 (or 45) degrees for full 10 seconds  6a. motor left leg: 0=No drift, limb holds 90 (or 45) degrees for full 10 seconds  6b  Motor right leg:  0=No drift, limb holds 90 (or 45) degrees for full 10 seconds  7. Limb Ataxia: 0=Absent  8.  Sensory: 0=Normal; no sensory loss  9. Best Language:  1=Mild to moderate aphasia; some obvious loss of fluency or facility of comprehension without significant limitation on ideas expressed or form of expression.  10. Dysarthria: 1=Mild to moderate, patient slurs at least some words and at worst, can be understood with some difficulty  11. Extinction and Inattention: 0=No  abnormality  12. Distal motor function: 0=Normal   Total:   2    Procedures  Critical Care performed: No  ____________________________________________   INITIAL IMPRESSION / ASSESSMENT AND PLAN / ED COURSE  As part of my medical decision making, I reviewed the following data within the [facility] notes reviewed and incorporated, Labs reviewed, EKG interpreted, Old chart reviewed, Radiograph reviewed, Discussed with admitting physician and Notes from prior ED visits   77 year old female who presents with slurred speech and left hand weakness, unknown time of onset.  Differential diagnosis includes but is not limited to TIA, CVA, ICH, etc.  ED code stroke was not called due to unknown time of onset.   Clinical Course as of Oct 11 698  Sun Oct 11, 2017  0200 Patient noted to be hypotensive.  At her  last clinic note in the records, her blood pressure was normal.  Will obtain lactate and urinalysis.   [JS]    Clinical Course User Index [JS] Paulette Blanch, MD     ____________________________________________   FINAL CLINICAL IMPRESSION(S) / ED DIAGNOSES  Final diagnoses:  Weakness  Cerebrovascular accident (CVA), unspecified mechanism [facility])     ED Discharge Orders    None       Note:  This document was prepared using Dragon voice recognition software and may include unintentional dictation errors.    Paulette Blanch, MD 10/11/17 0700

## 2017-10-11 NOTE — Progress Notes (Addendum)
Admitted this morning for left arm weakness, slurred speech, gait imbalance.  Patient found to have small acute stroke in right MCA territory.  Labs showed ultrasound of carotids less than 50% stenosis bilaterally, cardiogram showed EF more than 55 to 60%, normal LV function, no significant valvular abnormalities.  And also has slight UTI.  1.  Acute stroke: Change the patient from observation to inpatient status, continue aspirin, add Plavix 75 mg daily, neurology consult, physical therapy consult, speech therapy, started on dysphagia 3 diet, 2.  COPD:  faint wheezing noted in left upper lungs, continue Breo Ellipta, Cruz Ellipta, and Ventolin. 3.   hypertriglyceridemia: Patient is on TriCor 160 mg daily. 4 discontinue IV fluids, patient is encouraged p.o. Intake. Time spent 20 minutes.

## 2017-10-12 DIAGNOSIS — I63411 Cerebral infarction due to embolism of right middle cerebral artery: Secondary | ICD-10-CM | POA: Diagnosis not present

## 2017-10-12 DIAGNOSIS — I639 Cerebral infarction, unspecified: Secondary | ICD-10-CM | POA: Diagnosis not present

## 2017-10-12 MED ORDER — CLOPIDOGREL BISULFATE 75 MG PO TABS
75.0000 mg | ORAL_TABLET | Freq: Every day | ORAL | Status: DC
  Administered 2017-10-12: 13:00:00 75 mg via ORAL
  Filled 2017-10-12: qty 1

## 2017-10-12 MED ORDER — CLOPIDOGREL BISULFATE 75 MG PO TABS: 75.0000 mg | ORAL_TABLET | Freq: Every day | ORAL | 0 refills | Status: DC

## 2017-10-12 NOTE — Care Management (Signed)
Discharge to home today per Dr. Darvin Neighbours. Outpatient speech is recommended. Referral signed and faxed to Rehab department. Ms. Tarnow is in agreement with this plan Shelbie Ammons RN MSN CCM Care Management [phone]

## 2017-10-12 NOTE — Evaluation (Signed)
Speech Language Pathology Evaluation Patient Details Name: Christine Morris MRN: [id number] DOB: 1940/09/20 Today's Date: 10/12/2017 Time: 1300-1400 SLP Time Calculation (min) (ACUTE ONLY): 60 min  Problem List:  Patient Active Problem List   Diagnosis Date Noted  . TIA (transient ischemic attack) 10/11/2017  . CVA (cerebral vascular accident) ([place]) 10/11/2017   Past Medical History:  Past Medical History:  Diagnosis Date  . COPD (chronic obstructive pulmonary disease) ([place])   . Vertigo    Past Surgical History: History reviewed. No pertinent surgical history. HPI:  Patient is a 77 y/o female w/ past medical history of COPD presents to the emergency department complaining of left hand weakness.  The patient does not know the onset of her symptoms that she had been asleep when she awoke to adjust her covers and noticed that her left hand was numb.  She also had difficulty picking up the sheet due to lack of dexterity in her left hand.  Patient is also noticed that her speech is slurred.  In the emergency department CT of her head showed no acute intracranial abnormality.  Her speech has improved but is still slurred and her sensory changes have also improved.  Due to new onset neurologic symptoms emergency department staff called the hospitalist service for admission. 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 Pt is edentulous; does not wear dentures.   Assessment / Plan / Recommendation Clinical Impression  Pt seen today for informal Cognitive-linguistic screening at bedside. Pt appears to present w/ Motor Speech deficits impacting her articulation of speech; Dysarthria. Pt exhibits some hesitancies and reduced intelligibility of her speech but when following strategies (educated on) to include Slowing Down, Increasing Volume, Over-articulation, and Pausing when needed, her intelligibility during  conversation increased markedly. Pt was aware of her speech articulation errors and made attempts to self-correct. She was able to follow instructions/education given to target motor planning.  Pt appeared to present w/ no Auditory Comprehension or Expressive Language deficits; no Cognitive deficits. In addition, pt exhibited no overt Reading or Writing deficits - she read and wrote at the sentence level. Pt was Oriented x4 w/ appropriate awareness of the events leading up to her hospital admission.  Recommend pt have Outpatient f/u for Dysarthria in order to enhance her intelligibility of speech during conversation w/ others in ADLs. Handouts given on general strategies; Dysarthria; OMEs. Pt and spouse gave agreement. CM obtaining order for ST services at discharge.     SLP Assessment  SLP Recommendation/Assessment: All further Speech Lanaguage Pathology  needs can be addressed in the next venue of care SLP Visit Diagnosis: Dysarthria and anarthria (R47.1)    Follow Up Recommendations  Outpatient SLP    Frequency and Duration (TBD OP)  (TBD OP)      SLP Evaluation Cognition  Overall Cognitive Status: Within Functional Limits for tasks assessed Arousal/Alertness: Awake/alert Orientation Level: Oriented X4 Attention: Focused;Sustained Focused Attention: Appears intact Sustained Attention: Appears intact Memory: Appears intact Awareness: Appears intact Problem Solving: Appears intact Executive Function: Reasoning;Sequencing Reasoning: Appears intact(general ADLs) Sequencing: Appears intact(general ADLs) Behaviors: (none) Safety/Judgment: Appears intact Comments: w/ general ADLs around the house       Comprehension  Auditory Comprehension Overall Auditory Comprehension: Appears within functional limits for tasks assessed Yes/No Questions: Within Functional Limits Commands: Within Functional Limits Conversation: Complex Interfering Components: (none) Visual  Recognition/Discrimination Discrimination: Not tested    Expression Expression Primary Mode of Expression: Verbal  Verbal Expression Overall Verbal Expression: Appears within functional limits for tasks assessed Initiation: No impairment Automatic Speech: (all WFL) Level of Generative/Spontaneous Verbalization: Sentence Repetition: No impairment Naming: No impairment Pragmatics: No impairment Interfering Components: Speech intelligibility(dysarthria; edentulous status) Effective Techniques: (slowing down; getting louder; over-articulation) Non-Verbal Means of Communication: Not applicable Written Expression Dominant Hand: Right Written Expression: Within Functional Limits   Oral / Motor  Oral Motor/Sensory Function Overall Oral Motor/Sensory Function: Within functional limits(grossly) Motor Speech Overall Motor Speech: Impaired Respiration: Within functional limits Phonation: Normal Resonance: Within functional limits Articulation: Impaired Level of Impairment: Word Intelligibility: Intelligibility reduced Word: 75-100% accurate Phrase: 75-100% accurate Sentence: 75-100% accurate Conversation: 75-100% accurate Motor Planning: Impaired Level of Impairment: Word Motor Speech Errors: Aware;Inconsistent Interfering Components: Inadequate dentition Effective Techniques: Slow rate;Increased vocal intensity;Over-articulate;Pause   GO                     Orinda Kenner, MS, CCC-SLP Layana Konkel 10/12/2017, 2:30 PM

## 2017-10-12 NOTE — Progress Notes (Signed)
Discharge instructions given and went over with patient at bedside. Prescription reviewed. All questions answered. Patient discharged home with husband via wheelchair by nursing staff. Madlyn Frankel, RN

## 2017-10-12 NOTE — Discharge Instructions (Signed)
Heart healthy diet

## 2017-10-12 NOTE — Evaluation (Signed)
Occupational Therapy Evaluation Patient Details Name: Christine Morris MRN: [id number] DOB: 07-28-1940 Today's Date: 10/12/2017    History of Present Illness Christine Morris is a 77yo white female PMH: COPD on 3L chronic at home, comes to [facility] on 5/19 d/t Left hand numbness and slurred speech. Pt denies paresthesia of the face arm or legs. Pt reports balance deficits at baseline with chronic RW use, but no acute gait instability or leg weakness. Pt reports intermittent paresthesia in LUE x2-3 weeks, typically associated with sleeping. Since arrival speech and Left hand symptoms have improved but are still evident.  At time of OT evaluation, all symptoms have resolved.   Clinical Impression   Pt seen for OT evaluation this date. Prior to hospital admission, pt was modified independent using 2WW for all mobility, spouse performed most cooking/cleaning tasks and provided supervision assist for safe walk-in shower transfers, shower chair for seated shower. Pt notes mild balance deficits at baseline. Pt lives in a 1 story home with 4 steps to enter with L hand rail. Pt denies falls over past 12 months.  Currently pt demonstrates impairments in expressive language stating, "I know what I want to say, but have a hard time getting it out." Reminded pt of SLP instructions posted in room to support her speech. Pt at baseline independence for mobility and ADL tasks upon assessment. All LUE symptoms have resolved. Pt denies dizziness or any deficits in strength, coordination, sensation, vision, or cognition. No skilled OT needs identified. Will sign off. Please reconsult if additional OT needs arise.      Follow Up Recommendations  No OT follow up    Equipment Recommendations  None recommended by OT    Recommendations for Other Services       Precautions / Restrictions Precautions Precautions: None Restrictions Weight Bearing Restrictions: No      Mobility Bed Mobility Overal bed mobility:  Independent             General bed mobility comments: no difficulty, no LOB  Transfers Overall transfer level: Independent Equipment used: None             General transfer comment: no difficulty, no LOB    Balance Overall balance assessment: Mild deficits observed, not formally tested                                         ADL either performed or assessed with clinical judgement   ADL Overall ADL's : At baseline;Independent                                       General ADL Comments: Pt at independent baseline for ADL tasks, no deficits noted     Vision Baseline Vision/History: Wears glasses Wears Glasses: At all times(reports she needs new ones) Patient Visual Report: No change from baseline Vision Assessment?: No apparent visual deficits     Perception     Praxis      Pertinent Vitals/Pain Pain Assessment: No/denies pain     Hand Dominance Right   Extremity/Trunk Assessment Upper Extremity Assessment Upper Extremity Assessment: Overall WFL for tasks assessed(at least 4+/5 bilaterally, intact sensation and coordination)   Lower Extremity Assessment Lower Extremity Assessment: Overall WFL for tasks assessed(grossly at least 4+/5 bilaterally, intact sensation and coordination)   Cervical /  Trunk Assessment Cervical / Trunk Assessment: Normal   Communication Communication Communication: Expressive difficulties("I know what I want to say, I just can't seem to get it out.")   Cognition Arousal/Alertness: Awake/alert Behavior During Therapy: WFL for tasks assessed/performed Overall Cognitive Status: Within Functional Limits for tasks assessed                                     General Comments       Exercises     Shoulder Instructions      Home Living Family/patient expects to be discharged to:: Private residence Living Arrangements: Spouse/significant other Available Help at Discharge:  Family;Available 24 hours/day Type of Home: Mobile home Home Access: Stairs to enter Entrance Stairs-Number of Steps: 4 Entrance Stairs-Rails: Right;Left Home Layout: One level     Bathroom Shower/Tub: Occupational psychologist: Standard     Home Equipment: Clinical cytogeneticist - 2 wheels          Prior Functioning/Environment Level of Independence: Independent with assistive device(s)        Comments: chronic O2 and RW use at all times, exits home for medical appoitnments and family gatherings only, estimated 1-3x monthly. Spouse does most cooking/cleaning. Spouse provides supervision for shower transfers for safety, but pt able to perform ADL tasks otherwise independently.        OT Problem List:        OT Treatment/Interventions:      OT Goals(Current goals can be found in the care plan section) Acute Rehab OT Goals Patient Stated Goal: for my speech to get better and to go home OT Goal Formulation: All assessment and education complete, DC therapy  OT Frequency:     Barriers to D/C:            Co-evaluation              AM-PAC PT "6 Clicks" Daily Activity     Outcome Measure Help from another person eating meals?: None Help from another person taking care of personal grooming?: None Help from another person toileting, which includes using toliet, bedpan, or urinal?: None Help from another person bathing (including washing, rinsing, drying)?: None Help from another person to put on and taking off regular upper body clothing?: None Help from another person to put on and taking off regular lower body clothing?: None 6 Click Score: 24   End of Session Equipment Utilized During Treatment: Oxygen  Activity Tolerance: Patient tolerated treatment well Patient left: in bed;with call bell/phone within reach  OT Visit Diagnosis: Unsteadiness on feet (R26.81)                Time: 1749-4496 OT Time Calculation (min): 9 min Charges:  OT General  Charges $OT Visit: 1 Visit OT Evaluation $OT Eval Low Complexity: 1 Low  Jeni Salles, MPH, MS, OTR/L ascom [phone] 10/12/17, 12:25 PM

## 2017-10-14 LAB — URINE CULTURE: Culture: >=100000 — AB

## 2017-10-16 NOTE — Discharge Summary (Signed)
[facility] [place] NAME: Christine Morris    MR#:  [id number]  DATE OF BIRTH:  06-29-1940  DATE OF ADMISSION:  10/11/2017 ADMITTING PHYSICIAN: Harrie Foreman, MD  DATE OF DISCHARGE: 10/12/2017  5:07 PM  PRIMARY CARE PHYSICIAN: Alexis Goodell, MD   ADMISSION DIAGNOSIS:  Weakness [R53.1] Cerebrovascular accident (CVA), unspecified mechanism ([place]) [I63.9]  DISCHARGE DIAGNOSIS:  Active Problems:   TIA (transient ischemic attack)   CVA (cerebral vascular accident) ([place])   SECONDARY DIAGNOSIS:   Past Medical History:  Diagnosis Date  . COPD (chronic obstructive pulmonary disease) ([place])   . Vertigo      ADMITTING HISTORY  Chief Complaint: Weakness HPI: The patient with past medical history of COPD presents to the emergency department complaining of left hand weakness.  The patient does not know the onset of her symptoms that she had been asleep when she awoke to adjust her covers and noticed that her left hand was numb.  She also had difficulty picking up the sheet due to lack of dexterity in her left hand.  Patient is also noticed that her speech is slurred.  In the emergency department CT of her head showed no acute intracranial abnormality.  Her speech has improved but is still slurred and her sensory changes have also improved.  Due to new onset neurologic symptoms emergency department staff called the hospitalist service for admission.    HOSPITAL COURSE:   *Acute ride MCA territory CVA patient admitted to medical floor. Started on aspirin and statin. Neural checks. Seen by physical therapy and occupational therapy.  Patient being discharged home on aspirin, Plavix, statin.  *E. coli UTI. Treated with IV antibiotics in the hospital and switch to orals and discharge.  Discharged home in stable condition with home health services set up. CONSULTS OBTAINED:  Treatment Team:  Alexis Goodell, MD  DRUG ALLERGIES:  No Known  Allergies  DISCHARGE MEDICATIONS:   Allergies as of 10/12/2017   No Known Allergies     Medication List    TAKE these medications   baclofen 10 MG tablet Commonly known as:  LIORESAL Take 10 mg by mouth at bedtime.   BREO ELLIPTA 200-25 MCG/INH Aepb Generic drug:  fluticasone furoate-vilanterol Inhale 1 puff into the lungs daily.   calcitRIOL 0.5 MCG capsule Commonly known as:  ROCALTROL Take 0.5 mcg by mouth daily.   citalopram 20 MG tablet Commonly known as:  CELEXA Take 20 mg by mouth every morning.   clopidogrel 75 MG tablet Commonly known as:  PLAVIX Take 1 tablet (75 mg total) by mouth daily.   fenofibrate 160 MG tablet Take 160 mg by mouth daily.   ferrous gluconate 324 MG tablet Commonly known as:  FERGON Take 1 tablet by mouth daily with breakfast.   INCRUSE ELLIPTA 62.5 MCG/INH Aepb Generic drug:  umeclidinium bromide Inhale 1 puff into the lungs daily.   levothyroxine 88 MCG tablet Commonly known as:  SYNTHROID, LEVOTHROID Take 88 mcg by mouth daily.   montelukast 10 MG tablet Commonly known as:  SINGULAIR Take 10 mg by mouth every morning.   simvastatin 40 MG tablet Commonly known as:  ZOCOR Take 40 mg by mouth daily.   vitamin B-12 1000 MCG tablet Commonly known as:  CYANOCOBALAMIN Take 1,000 mcg by mouth daily.       Today   VITAL SIGNS:  Blood pressure 120/67, pulse 66, temperature 98.5 F (36.9 C), temperature source Oral, resp. rate 20, height  5\' 3"  (1.6 m), weight 65.8 kg (145 lb), SpO2 100 %.  I/O:  No intake or output data in the 24 hours ending 10/16/17 1532  PHYSICAL EXAMINATION:  Physical Exam  GENERAL:  77 y.o.-year-old patient lying in the bed with no acute distress.  LUNGS: Normal breath sounds bilaterally, no wheezing, rales,rhonchi or crepitation. No use of accessory muscles of respiration.  CARDIOVASCULAR: S1, S2 normal. No murmurs, rubs, or gallops.  ABDOMEN: Soft, non-tender, non-distended. Bowel sounds present.  No organomegaly or mass.  NEUROLOGIC: Moves all 4 extremities. PSYCHIATRIC: The patient is alert and oriented x 3.  SKIN: No obvious rash, lesion, or ulcer.   DATA REVIEW:   CBC Recent Labs  Lab 10/11/17 0122  WBC 10.9  HGB 12.2  HCT 37.1  PLT 329    Chemistries  Recent Labs  Lab 10/11/17 0122  NA 141  K 3.5  CL 104  CO2 30  GLUCOSE 102*  BUN 21*  CREATININE 0.79  CALCIUM 9.6  AST 25  ALT 17  ALKPHOS 49  BILITOT 0.5    Cardiac Enzymes Recent Labs  Lab 10/11/17 0122  TROPONINI <0.03    Microbiology Results  Results for orders placed or performed during the hospital encounter of 10/11/17  Urine Culture     Status: Abnormal   Collection Time: 10/11/17  2:23 AM  Result Value Ref Range Status   Specimen Description   Final    URINE, RANDOM Performed at [facility], [address] [place], [place] [address]    Special Requests   Final    NONE Performed at [facility], [address] [place], [place] [address]    Culture >=100,000 COLONIES/mL ESCHERICHIA COLI (A)  Final   Report Status 10/14/2017 FINAL  Final   Organism ID, Bacteria ESCHERICHIA COLI (A)  Final      Susceptibility   Escherichia coli - MIC*    AMPICILLIN 8 SENSITIVE Sensitive     CEFAZOLIN <=4 SENSITIVE Sensitive     CEFTRIAXONE <=1 SENSITIVE Sensitive     CIPROFLOXACIN <=0.25 SENSITIVE Sensitive     GENTAMICIN <=1 SENSITIVE Sensitive     IMIPENEM <=0.25 SENSITIVE Sensitive     NITROFURANTOIN <=16 SENSITIVE Sensitive     TRIMETH/SULFA <=20 SENSITIVE Sensitive     AMPICILLIN/SULBACTAM 4 SENSITIVE Sensitive     PIP/TAZO <=4 SENSITIVE Sensitive     Extended ESBL NEGATIVE Sensitive     * >=100,000 COLONIES/mL ESCHERICHIA COLI    RADIOLOGY:  No results found.  Follow up with PCP in 1 week.  Management plans discussed with the patient, family and they are in agreement.  CODE STATUS:  Code Status History    Date Active Date Inactive Code Status Order ID  Comments User Context   10/11/2017 0535 10/12/2017 2017 Full Code [id number]  Harrie Foreman, MD Inpatient    Advance Directive Documentation     Most Recent Value  Type of Advance Directive  Healthcare Power of Attorney, Living will  Pre-existing out of facility DNR order (yellow form or pink MOST form)  -  "MOST" Form in Place?  -      TOTAL TIME TAKING CARE OF THIS PATIENT ON DAY OF DISCHARGE: more than 30 minutes.   Leia Alf Esabella Stockinger M.D on 10/16/2017 at 3:32 PM  Between 7am to 6pm - Pager - [phone]  After 6pm go to [email or web address] - password [email or web address] [facility]  Office  [phone]  CC: Primary care physician; Doy Mince,  Magda Paganini, MD  Note: This dictation was prepared with Dragon dictation along with smaller phrase technology. Any transcriptional errors that result from this process are unintentional.

## 2017-12-03 ENCOUNTER — Encounter

## 2017-12-03 ENCOUNTER — Ambulatory Visit: Payer: Medicare Other | Admitting: Internal Medicine

## 2018-05-31 ENCOUNTER — Inpatient Hospital Stay
Admission: EM | Admit: 2018-05-31 | Discharge: 2018-06-03 | DRG: 871 | Disposition: A | Discharge status: Home / Self Care | Payer: Medicare Other | Attending: Internal Medicine | Admitting: Internal Medicine

## 2018-05-31 ENCOUNTER — Other Ambulatory Visit: Payer: Self-pay

## 2018-05-31 ENCOUNTER — Emergency Department: Payer: Medicare Other

## 2018-05-31 DIAGNOSIS — E876 Hypokalemia: Secondary | ICD-10-CM | POA: Diagnosis present

## 2018-05-31 DIAGNOSIS — I482 Chronic atrial fibrillation, unspecified: Secondary | ICD-10-CM | POA: Diagnosis present

## 2018-05-31 DIAGNOSIS — Z79899 Other long term (current) drug therapy: Secondary | ICD-10-CM | POA: Diagnosis not present

## 2018-05-31 DIAGNOSIS — J181 Lobar pneumonia, unspecified organism: Secondary | ICD-10-CM | POA: Diagnosis present

## 2018-05-31 DIAGNOSIS — J189 Pneumonia, unspecified organism: Secondary | ICD-10-CM

## 2018-05-31 DIAGNOSIS — E872 Acidosis: Secondary | ICD-10-CM | POA: Diagnosis present

## 2018-05-31 DIAGNOSIS — K2900 Acute gastritis without bleeding: Secondary | ICD-10-CM | POA: Diagnosis present

## 2018-05-31 DIAGNOSIS — I480 Paroxysmal atrial fibrillation: Secondary | ICD-10-CM | POA: Diagnosis present

## 2018-05-31 DIAGNOSIS — K921 Melena: Secondary | ICD-10-CM | POA: Diagnosis present

## 2018-05-31 DIAGNOSIS — Z888 Allergy status to other drugs, medicaments and biological substances status: Secondary | ICD-10-CM | POA: Diagnosis not present

## 2018-05-31 DIAGNOSIS — I10 Essential (primary) hypertension: Secondary | ICD-10-CM | POA: Diagnosis present

## 2018-05-31 DIAGNOSIS — J9620 Acute and chronic respiratory failure, unspecified whether with hypoxia or hypercapnia: Secondary | ICD-10-CM | POA: Diagnosis present

## 2018-05-31 DIAGNOSIS — Z9981 Dependence on supplemental oxygen: Secondary | ICD-10-CM | POA: Diagnosis not present

## 2018-05-31 DIAGNOSIS — Z87891 Personal history of nicotine dependence: Secondary | ICD-10-CM

## 2018-05-31 DIAGNOSIS — A419 Sepsis, unspecified organism: Principal | ICD-10-CM | POA: Diagnosis present

## 2018-05-31 DIAGNOSIS — J441 Chronic obstructive pulmonary disease with (acute) exacerbation: Secondary | ICD-10-CM | POA: Diagnosis present

## 2018-05-31 DIAGNOSIS — R040 Epistaxis: Secondary | ICD-10-CM | POA: Diagnosis present

## 2018-05-31 DIAGNOSIS — E039 Hypothyroidism, unspecified: Secondary | ICD-10-CM | POA: Diagnosis present

## 2018-05-31 DIAGNOSIS — Z79890 Hormone replacement therapy (postmenopausal): Secondary | ICD-10-CM

## 2018-05-31 DIAGNOSIS — J440 Chronic obstructive pulmonary disease with acute lower respiratory infection: Secondary | ICD-10-CM | POA: Diagnosis present

## 2018-05-31 DIAGNOSIS — Z8673 Personal history of transient ischemic attack (TIA), and cerebral infarction without residual deficits: Secondary | ICD-10-CM | POA: Diagnosis not present

## 2018-05-31 DIAGNOSIS — R531 Weakness: Secondary | ICD-10-CM

## 2018-05-31 DIAGNOSIS — Z7901 Long term (current) use of anticoagulants: Secondary | ICD-10-CM

## 2018-05-31 LAB — CBC WITH DIFFERENTIAL/PLATELET
ABS IMMATURE GRANULOCYTES: 0.07 10*3/uL (ref 0.00–0.07)
BASOS PCT: 0 %
Basophils Absolute: 0.1 10*3/uL (ref 0.0–0.1)
EOS ABS: 0.3 10*3/uL (ref 0.0–0.5)
EOS PCT: 2 %
HEMATOCRIT: 36.7 % (ref 36.0–46.0)
HEMOGLOBIN: 11.1 g/dL — AB (ref 12.0–15.0)
IMMATURE GRANULOCYTES: 0 %
LYMPHS ABS: 0.6 10*3/uL — AB (ref 0.7–4.0)
Lymphocytes Relative: 4 %
MCH: 28.7 pg (ref 26.0–34.0)
MCHC: 30.2 g/dL (ref 30.0–36.0)
MCV: 94.8 fL (ref 80.0–100.0)
MONOS PCT: 9 %
Monocytes Absolute: 1.4 10*3/uL — ABNORMAL HIGH (ref 0.1–1.0)
NEUTROS PCT: 85 %
Neutro Abs: 13.4 10*3/uL — ABNORMAL HIGH (ref 1.7–7.7)
Platelets: 270 10*3/uL (ref 150–400)
RBC: 3.87 MIL/uL (ref 3.87–5.11)
RDW: 14.9 % (ref 11.5–15.5)
Smear Review: NORMAL
WBC: 15.8 10*3/uL — ABNORMAL HIGH (ref 4.0–10.5)
nRBC: 0 % (ref 0.0–0.2)

## 2018-05-31 LAB — CG4 I-STAT (LACTIC ACID)
Lactic Acid, Venous: 2.42 mmol/L (ref 0.5–1.9)
Lactic Acid, Venous: 1.63 mmol/L (ref 0.5–1.9)

## 2018-05-31 LAB — COMPREHENSIVE METABOLIC PANEL
ALT: 12 U/L (ref 0–44)
AST: 20 U/L (ref 15–41)
Albumin: 3.7 g/dL (ref 3.5–5.0)
Alkaline Phosphatase: 52 U/L (ref 38–126)
Anion gap: 10 (ref 5–15)
BILIRUBIN TOTAL: 0.7 mg/dL (ref 0.3–1.2)
BUN: 14 mg/dL (ref 8–23)
CHLORIDE: 104 mmol/L (ref 98–111)
CO2: 24 mmol/L (ref 22–32)
CREATININE: 0.69 mg/dL (ref 0.44–1.00)
Calcium: 9.3 mg/dL (ref 8.9–10.3)
Glucose, Bld: 138 mg/dL — ABNORMAL HIGH (ref 70–99)
Potassium: 2.8 mmol/L — ABNORMAL LOW (ref 3.5–5.1)
Sodium: 138 mmol/L (ref 135–145)
Total Protein: 7.7 g/dL (ref 6.5–8.1)

## 2018-05-31 LAB — TROPONIN I

## 2018-05-31 MED ORDER — TRAZODONE HCL 50 MG PO TABS
25.0000 mg | ORAL_TABLET | Freq: Every evening | ORAL | Status: DC | PRN
  Administered 2018-05-31 – 2018-06-02 (×2): 25 mg via ORAL
  Filled 2018-05-31 (×2): qty 1

## 2018-05-31 MED ORDER — VITAMIN B-12 1000 MCG PO TABS
1000.0000 ug | ORAL_TABLET | Freq: Every day | ORAL | Status: DC
  Administered 2018-06-01 – 2018-06-03 (×3): 1000 ug via ORAL
  Filled 2018-05-31 (×3): qty 1

## 2018-05-31 MED ORDER — BACLOFEN 10 MG PO TABS
10.0000 mg | ORAL_TABLET | Freq: Every day | ORAL | Status: DC
  Administered 2018-05-31 – 2018-06-02 (×3): 10 mg via ORAL
  Filled 2018-05-31 (×4): qty 1

## 2018-05-31 MED ORDER — DOCUSATE SODIUM 100 MG PO CAPS
100.0000 mg | ORAL_CAPSULE | Freq: Two times a day (BID) | ORAL | Status: DC
  Administered 2018-06-01 – 2018-06-02 (×2): 100 mg via ORAL
  Filled 2018-05-31 (×5): qty 1

## 2018-05-31 MED ORDER — CALCITRIOL 0.25 MCG PO CAPS
0.5000 ug | ORAL_CAPSULE | Freq: Every day | ORAL | Status: DC
  Administered 2018-06-01 – 2018-06-03 (×3): 0.5 ug via ORAL
  Filled 2018-05-31 (×3): qty 2

## 2018-05-31 MED ORDER — IPRATROPIUM-ALBUTEROL 0.5-2.5 (3) MG/3ML IN SOLN
3.0000 mL | Freq: Once | RESPIRATORY_TRACT | Status: AC
  Administered 2018-05-31: 3 mL via RESPIRATORY_TRACT

## 2018-05-31 MED ORDER — UMECLIDINIUM BROMIDE 62.5 MCG/INH IN AEPB
1.0000 | INHALATION_SPRAY | Freq: Every day | RESPIRATORY_TRACT | Status: DC
  Administered 2018-06-01 – 2018-06-03 (×3): 1 via RESPIRATORY_TRACT
  Filled 2018-05-31: qty 7

## 2018-05-31 MED ORDER — ONDANSETRON HCL 4 MG PO TABS: 4.0000 mg | ORAL_TABLET | Freq: Four times a day (QID) | ORAL | Status: DC | PRN

## 2018-05-31 MED ORDER — METHYLPREDNISOLONE SODIUM SUCC 125 MG IJ SOLR
60.0000 mg | INTRAMUSCULAR | Status: DC
  Administered 2018-06-01 – 2018-06-02 (×2): 60 mg via INTRAVENOUS
  Filled 2018-05-31 (×2): qty 2

## 2018-05-31 MED ORDER — FERROUS GLUCONATE 324 (38 FE) MG PO TABS
324.0000 mg | ORAL_TABLET | Freq: Every day | ORAL | Status: DC
  Administered 2018-06-01 – 2018-06-03 (×3): 324 mg via ORAL
  Filled 2018-05-31 (×3): qty 1

## 2018-05-31 MED ORDER — ESCITALOPRAM OXALATE 10 MG PO TABS
20.0000 mg | ORAL_TABLET | Freq: Every day | ORAL | Status: DC
  Administered 2018-06-01 – 2018-06-03 (×3): 20 mg via ORAL
  Filled 2018-05-31 (×3): qty 2

## 2018-05-31 MED ORDER — SODIUM CHLORIDE 0.9 % IV SOLN
80.0000 mg | Freq: Once | INTRAVENOUS | Status: AC
  Administered 2018-05-31: 80 mg via INTRAVENOUS
  Filled 2018-05-31: qty 80

## 2018-05-31 MED ORDER — SODIUM CHLORIDE 0.9 % IV SOLN
INTRAVENOUS | Status: DC
  Administered 2018-06-01: 09:00:00 via INTRAVENOUS

## 2018-05-31 MED ORDER — LEVOTHYROXINE SODIUM 88 MCG PO TABS
88.0000 ug | ORAL_TABLET | Freq: Every day | ORAL | Status: DC
  Administered 2018-06-01 – 2018-06-03 (×3): 88 ug via ORAL
  Filled 2018-05-31 (×3): qty 1

## 2018-05-31 MED ORDER — SIMVASTATIN 20 MG PO TABS
40.0000 mg | ORAL_TABLET | Freq: Every day | ORAL | Status: DC
  Administered 2018-05-31 – 2018-06-02 (×3): 40 mg via ORAL
  Filled 2018-05-31 (×3): qty 2

## 2018-05-31 MED ORDER — VITAMIN D (ERGOCALCIFEROL) 1.25 MG (50000 UNIT) PO CAPS: 50000.0000 [IU] | ORAL_CAPSULE | ORAL | Status: DC

## 2018-05-31 MED ORDER — IPRATROPIUM-ALBUTEROL 0.5-2.5 (3) MG/3ML IN SOLN
3.0000 mL | Freq: Once | RESPIRATORY_TRACT | Status: DC
  Filled 2018-05-31: qty 3

## 2018-05-31 MED ORDER — ACETAMINOPHEN 650 MG RE SUPP: 650.0000 mg | Freq: Four times a day (QID) | RECTAL | Status: DC | PRN

## 2018-05-31 MED ORDER — FLUTICASONE FUROATE-VILANTEROL 200-25 MCG/INH IN AEPB
1.0000 | INHALATION_SPRAY | Freq: Every day | RESPIRATORY_TRACT | Status: DC
  Administered 2018-06-01 – 2018-06-03 (×3): 1 via RESPIRATORY_TRACT
  Filled 2018-05-31: qty 28

## 2018-05-31 MED ORDER — PANTOPRAZOLE SODIUM 40 MG IV SOLR
40.0000 mg | Freq: Two times a day (BID) | INTRAVENOUS | Status: DC
  Administered 2018-05-31 – 2018-06-01 (×2): 40 mg via INTRAVENOUS
  Filled 2018-05-31 (×2): qty 40

## 2018-05-31 MED ORDER — MONTELUKAST SODIUM 10 MG PO TABS
10.0000 mg | ORAL_TABLET | Freq: Every day | ORAL | Status: DC
  Administered 2018-05-31 – 2018-06-02 (×3): 10 mg via ORAL
  Filled 2018-05-31 (×3): qty 1

## 2018-05-31 MED ORDER — SODIUM CHLORIDE 0.9 % IV SOLN
500.0000 mg | INTRAVENOUS | Status: DC
  Administered 2018-05-31 – 2018-06-01 (×2): 500 mg via INTRAVENOUS
  Filled 2018-05-31 (×3): qty 500

## 2018-05-31 MED ORDER — APIXABAN 5 MG PO TABS
5.0000 mg | ORAL_TABLET | Freq: Two times a day (BID) | ORAL | Status: DC
  Administered 2018-05-31 – 2018-06-03 (×6): 5 mg via ORAL
  Filled 2018-05-31 (×6): qty 1

## 2018-05-31 MED ORDER — ACETAMINOPHEN 325 MG PO TABS: 650.0000 mg | ORAL_TABLET | Freq: Four times a day (QID) | ORAL | Status: DC | PRN

## 2018-05-31 MED ORDER — CITALOPRAM HYDROBROMIDE 20 MG PO TABS: 20.0000 mg | ORAL_TABLET | ORAL | Status: DC

## 2018-05-31 MED ORDER — SODIUM CHLORIDE 0.9 % IV SOLN
2.0000 g | INTRAVENOUS | Status: DC
  Administered 2018-05-31 – 2018-06-02 (×3): 2 g via INTRAVENOUS
  Filled 2018-05-31 (×2): qty 20
  Filled 2018-05-31 (×2): qty 2

## 2018-05-31 MED ORDER — IPRATROPIUM-ALBUTEROL 0.5-2.5 (3) MG/3ML IN SOLN
3.0000 mL | RESPIRATORY_TRACT | Status: DC
  Administered 2018-05-31 – 2018-06-02 (×8): 3 mL via RESPIRATORY_TRACT
  Filled 2018-05-31 (×8): qty 3

## 2018-05-31 MED ORDER — BISACODYL 5 MG PO TBEC: 5.0000 mg | DELAYED_RELEASE_TABLET | Freq: Every day | ORAL | Status: DC | PRN

## 2018-05-31 MED ORDER — METHYLPREDNISOLONE SODIUM SUCC 125 MG IJ SOLR
125.0000 mg | Freq: Once | INTRAMUSCULAR | Status: AC
  Administered 2018-05-31: 125 mg via INTRAVENOUS
  Filled 2018-05-31: qty 2

## 2018-05-31 MED ORDER — SODIUM CHLORIDE 0.9 % IV SOLN
8.0000 mg/h | INTRAVENOUS | Status: DC
  Administered 2018-05-31: 8 mg/h via INTRAVENOUS
  Filled 2018-05-31: qty 80

## 2018-05-31 MED ORDER — SODIUM CHLORIDE 0.9 % IV BOLUS
1000.0000 mL | Freq: Once | INTRAVENOUS | Status: AC
  Administered 2018-05-31: 1000 mL via INTRAVENOUS

## 2018-05-31 MED ORDER — FENOFIBRATE 160 MG PO TABS
160.0000 mg | ORAL_TABLET | Freq: Every day | ORAL | Status: DC
  Administered 2018-06-01 – 2018-06-03 (×3): 160 mg via ORAL
  Filled 2018-05-31 (×3): qty 1

## 2018-05-31 MED ORDER — ONDANSETRON HCL 4 MG/2ML IJ SOLN: 4.0000 mg | Freq: Four times a day (QID) | INTRAMUSCULAR | Status: DC | PRN

## 2018-05-31 NOTE — ED Triage Notes (Signed)
Pt arrived via ems for reports of shortness of breath, congested/productive cough for the past week and dark tarry stools x1 week that have increased in frequency Pt given albuterol and duoneb in route to ED - increased O2 sat from 92% on 3L to 96% during treatment

## 2018-05-31 NOTE — H&P (Signed)
[facility] [place] NAME: Christine Morris    MR#:  [id number]  DATE OF BIRTH:  Apr 10, 1941  DATE OF ADMISSION:  05/31/2018  PRIMARY CARE PHYSICIAN: Patient, No Pcp Per   REQUESTING/REFERRING PHYSICIAN: Harvest Dark  CHIEF COMPLAINT: Shortness of breath   Chief Complaint  Patient presents with  . Shortness of Breath  . Weakness    HISTORY OF PRESENT ILLNESS:  Christine Morris  is a 78 y.o. female with a known history of hypertension, hypothyroidism, chronic A. fib, history of cryptogenic stroke, chronic respiratory failure, COPD on 3 L of oxygen all the time comes in because of worsening shortness of breath, cough for the past 2 to 3 days.  Complains of cough, sputum production, worsening shortness of breath.  Also mentioned that she had dark stool for 1 week.  No abdominal pain.  PAST MEDICAL HISTORY:   Past Medical History:  Diagnosis Date  . COPD (chronic obstructive pulmonary disease) ([place])   . Vertigo     PAST SURGICAL HISTOIRY:  History reviewed. No pertinent surgical history.  SOCIAL HISTORY:   Social History   Tobacco Use  . Smoking status: Former Research scientist (life sciences)  . Smokeless tobacco: Never Used  Substance Use Topics  . Alcohol use: Never    Frequency: Never    FAMILY HISTORY:  No family history on file.  DRUG ALLERGIES:   Allergies  Allergen Reactions  . Rivaroxaban Hives    REVIEW OF SYSTEMS:  CONSTITUTIONAL: No fever, fatigue, generalized weakness  EYES: No blurred or double vision.  EARS, NOSE, AND THROAT: No tinnitus or ear pain.  RESPIRATORY: Cough, shortness of breath, wheezing CARDIOVASCULAR: No chest pain, orthopnea, edema.  GASTROINTESTINAL: No nausea, vomiting, diarrhea or abdominal pain.  GENITOURINARY: No dysuria, hematuria.  ENDOCRINE: No polyuria, nocturia,  HEMATOLOGY: No anemia, easy bruising or bleeding SKIN: No rash or lesion. MUSCULOSKELETAL: No joint pain or arthritis.    NEUROLOGIC: No tingling, numbness, weakness.  PSYCHIATRY: No anxiety or depression.   MEDICATIONS AT HOME:   Prior to Admission medications   Medication Sig Start Date End Date Taking? Authorizing Provider  acetaminophen (TYLENOL) 500 MG tablet Take 1,000 mg by mouth every 6 (six) hours as needed for pain.   Yes [provider]  apixaban (ELIQUIS) 5 MG TABS tablet Take 5 mg by mouth every 12 (twelve) hours. 04/30/18  Yes [provider]  baclofen (LIORESAL) 10 MG tablet Take 10 mg by mouth at bedtime.   Yes [provider]  BREO ELLIPTA 200-25 MCG/INH AEPB Inhale 1 puff into the lungs daily.   Yes [provider]  calcitRIOL (ROCALTROL) 0.5 MCG capsule Take 0.5 mcg by mouth daily.   Yes [provider]  citalopram (CELEXA) 20 MG tablet Take 20 mg by mouth every morning.   Yes [provider]  escitalopram (LEXAPRO) 20 MG tablet Take 20 mg by mouth daily.   Yes [provider]  fenofibrate 160 MG tablet Take 160 mg by mouth daily.   Yes [provider]  ferrous gluconate (FERGON) 324 MG tablet Take 1 tablet by mouth daily with breakfast.   Yes [provider]  INCRUSE ELLIPTA 62.5 MCG/INH AEPB Inhale 1 puff into the lungs daily.   Yes [provider]  levothyroxine (SYNTHROID, LEVOTHROID) 88 MCG tablet Take 88 mcg by mouth daily.   Yes [provider]  montelukast (SINGULAIR) 10 MG tablet Take 10 mg by mouth every morning.   Yes  [provider]  simvastatin (ZOCOR) 40 MG tablet Take 40 mg by mouth daily.   Yes [provider]  vitamin B-12 (CYANOCOBALAMIN) 1000 MCG tablet Take 1,000 mcg by mouth daily.   Yes [provider]  Vitamin D, Ergocalciferol, (DRISDOL) 1.25 MG (50000 UT) CAPS capsule Take 50,000 Units by mouth once a week.   Yes [provider]      VITAL SIGNS:  Blood pressure (!) 128/108, pulse (!) 108, temperature 98.3 F (36.8 C), temperature  source Oral, resp. rate (!) 21, height 5\' 2"  (1.575 m), weight 61.7 kg, SpO2 99 %.  PHYSICAL EXAMINATION:  GENERAL:  78 y.o.-year-old patient lying in the bed with no acute distress.  EYES: Pupils equal, round, reactive to light . No scleral icterus. Extraocular muscles intact.  HEENT: Head atraumatic, normocephalic. Oropharynx and nasopharynx clear.  NECK:  Supple, no jugular venous distention. No thyroid enlargement, no tenderness.  LUNGS:.  Has expiratory wheezing all lung fields. CARDIOVASCULAR: S1, S2 tachycardic no murmurs, rubs, or gallops.  ABDOMEN: Soft, nontender, nondistended. Bowel sounds present. No organomegaly or mass.  EXTREMITIES: No pedal edema, cyanosis, or clubbing.  NEUROLOGIC: Cranial nerves II through XII are intact. Muscle strength 5/5 in all extremities. Sensation intact. Gait not checked.  PSYCHIATRIC: The patient is alert and oriented x 3.  SKIN: No obvious rash, lesion, or ulcer.   LABORATORY PANEL:   CBC Recent Labs  Lab 05/31/18 1300  WBC 15.8*  HGB 11.1*  HCT 36.7  PLT 270   ------------------------------------------------------------------------------------------------------------------  Chemistries  Recent Labs  Lab 05/31/18 1300  NA 138  K 2.8*  CL 104  CO2 24  GLUCOSE 138*  BUN 14  CREATININE 0.69  CALCIUM 9.3  AST 20  ALT 12  ALKPHOS 52  BILITOT 0.7   ------------------------------------------------------------------------------------------------------------------  Cardiac Enzymes Recent Labs  Lab 05/31/18 1300  TROPONINI <0.03   ------------------------------------------------------------------------------------------------------------------  RADIOLOGY:  Dg Chest 2 View  Result Date: 05/31/2018 CLINICAL DATA:  Cough, shortness of breath EXAM: CHEST - 2 VIEW COMPARISON:  10/11/2017, 09/29/2012 FINDINGS: There is hazy bilateral lower lobe airspace disease and lingular airspace disease concerning for multilobar pneumonia.  There is no pleural effusion or pneumothorax. The heart and mediastinal contours are unremarkable. The osseous structures are unremarkable. IMPRESSION: Hazy bilateral lower lobe airspace disease and lingular airspace disease concerning for multilobar pneumonia. Followup PA and lateral chest X-ray is recommended in 3-4 weeks following trial of antibiotic therapy to ensure resolution and exclude underlying malignancy. Electronically Signed   By: Kathreen Devoid   On: 05/31/2018 11:43    EKG:   Orders placed or performed during the hospital encounter of 05/31/18  . ED EKG within 10 minutes  . ED EKG within 10 minutes  . ED EKG 12-Lead  . ED EKG 12-Lead  . EKG 12-Lead  . EKG 12-Lead   Sinus tachycardia with 140 bpm, narrow QRS complex, nonspecific ST-T changes. IMPRESSION AND PLAN:   78 year old female with history of COPD on three litres of oxygen, cryptogenic stroke status post implantable loop recorder follows up with [facility] neurology, for thyroidism, hypertension brought in because of worsening shortness of breath, cough now has multilobar pneumonia.  #1.sepsis present on admission secondary to pneumonia: Admit to telemetry, continue aggressive hydration, follow-up lactic acid, follow-up sputum cultures, blood cultures. 2.Acute on chronic respiratory failure secondary to bilateral lower lobe pneumonia, continue IV Rocephin, Zithromax, now needing 4 L of oxygen. #3, severe hypokalemia; replace potassium, check magnesium, admit on telemetry. 4.  Sinus tachycardia, likely due to sepsis, continue aggressive hydration, monitor on telemetry 5.  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  Continue Protonix 40 IV every 12.  Watch hemoglobin  closely.   Spoke with patient's daughter All the records are reviewed and case discussed with ED provider. Management plans discussed with the patient, family and they are in agreement.  CODE STATUS: full  TOTAL TIME TAKING CARE OF THIS PATIENT: 55 minutes.    Epifanio Lesches M.D on 05/31/2018 at 3:06 PM  Between 7am to 6pm - Pager - [phone]  After 6pm go to [email or web address] - password [email or web address] [facility]  Office  [phone]  CC: Primary care physician; Patient, No Pcp Per  Note: This dictation was prepared with Dragon dictation along with smaller phrase technology. Any transcriptional errors that result from this process are unintentional.

## 2018-05-31 NOTE — ED Notes (Signed)
Report was given to 2A

## 2018-05-31 NOTE — ED Notes (Signed)
Call back to 2A to give report

## 2018-05-31 NOTE — Progress Notes (Signed)
CODE SEPSIS - PHARMACY COMMUNICATION  **Broad Spectrum Antibiotics should be administered within 1 hour of Sepsis diagnosis**  Time Code Sepsis Called/Page Received: 05/31/18 @1238   Antibiotics Ordered: ceftriaxone/azithromycin   Time of 1st antibiotic administration: 05/31/18 @1304   Additional action taken by pharmacy: none  Oswald Hillock ,PharmD, BCPS Clinical Pharmacist  05/31/2018  12:39 PM

## 2018-05-31 NOTE — ED Notes (Signed)
Call to 2A, RN to return call

## 2018-05-31 NOTE — ED Notes (Signed)
Patient transported to X-ray 

## 2018-05-31 NOTE — ED Notes (Signed)
ISTAT lactic \\acit  preformed.Critical result of 2.42. Ellen,RN and MD Paduchowski made aware.

## 2018-05-31 NOTE — ED Notes (Signed)
Family is attentive at the bedside. Pt continues to cough up large amounts of thick sputum, emesis bag and tissues given, repositioned for comfort.  Notified pt that urine sample is needed

## 2018-05-31 NOTE — ED Provider Notes (Signed)
[facility] Emergency Department Provider Note  Time seen: 12:29 PM  I have reviewed the triage vital signs and the nursing notes.   HISTORY  Chief Complaint Shortness of Breath and Weakness    HPI Christine Morris is a 78 y.o. female with a past medical history of COPD, TIA, presents to the emergency department for weakness and difficulty breathing.  According to the patient for the past 2 to 3 days she has had increased weakness, increased shortness of breath with occasional cough.  Patient has COPD wears 3 L of oxygen 24/7.  States she has been coughing with significant sputum production over the past 2 to 3 days.  Denies any known fever.  Patient does states she is also been experiencing dark black stool for the past 1 week.  Denies any vomiting.  Denies any abdominal pain.  States chest tightness but denies any chest pain.   Past Medical History:  Diagnosis Date  . COPD (chronic obstructive pulmonary disease) ([place])   . Vertigo     Patient Active Problem List   Diagnosis Date Noted  . TIA (transient ischemic attack) 10/11/2017  . CVA (cerebral vascular accident) ([place]) 10/11/2017    History reviewed. No pertinent surgical history.  Prior to Admission medications   Medication Sig Start Date End Date Taking? Authorizing Provider  acetaminophen (TYLENOL) 500 MG tablet Take 1,000 mg by mouth every 6 (six) hours as needed for pain.   Yes [provider]  apixaban (ELIQUIS) 5 MG TABS tablet Take 5 mg by mouth every 12 (twelve) hours. 04/30/18  Yes [provider]  baclofen (LIORESAL) 10 MG tablet Take 10 mg by mouth at bedtime.   Yes [provider]  BREO ELLIPTA 200-25 MCG/INH AEPB Inhale 1 puff into the lungs daily.   Yes [provider]  calcitRIOL (ROCALTROL) 0.5 MCG capsule Take 0.5 mcg by mouth daily.   Yes [provider]  citalopram (CELEXA) 20 MG tablet Take 20 mg by mouth every morning.   Yes [provider]  escitalopram (LEXAPRO) 20 MG tablet Take 20 mg by mouth daily.   Yes [provider]  fenofibrate 160 MG tablet Take 160 mg by mouth daily.   Yes [provider]  ferrous gluconate (FERGON) 324 MG tablet Take 1 tablet by mouth daily with breakfast.   Yes [provider]  INCRUSE ELLIPTA 62.5 MCG/INH AEPB Inhale 1 puff into the lungs daily.   Yes [provider]  levothyroxine (SYNTHROID, LEVOTHROID) 88 MCG tablet Take 88 mcg by mouth daily.   Yes [provider]  montelukast (SINGULAIR) 10 MG tablet Take 10 mg by mouth every morning.   Yes [provider]  simvastatin (ZOCOR) 40 MG tablet Take 40 mg by mouth daily.   Yes [provider]  vitamin B-12 (CYANOCOBALAMIN) 1000 MCG tablet Take 1,000 mcg by mouth daily.   Yes [provider]  Vitamin D, Ergocalciferol, (DRISDOL) 1.25 MG (50000 UT) CAPS capsule Take 50,000 Units by mouth once a week.   Yes [provider]    Allergies  Allergen Reactions  . Rivaroxaban Hives    No family history on file.  Social History Social History   Tobacco Use  . Smoking status: Former Research scientist (life sciences)  . Smokeless tobacco: Never Used  Substance Use Topics  . Alcohol use: Never    Frequency: Never  . Drug use: Never    Review of Systems Constitutional: Negative for fever. Cardiovascular: Positive for  chest tightness Respiratory: Positive for shortness of breath.  Positive for cough with sputum production. Gastrointestinal: Negative for abdominal pain, vomiting.  Positive for dark stool x1 week Genitourinary: Negative for urinary compaints Musculoskeletal: Negative for musculoskeletal complaints Skin: Negative for skin complaints  Neurological: Negative for headache All other ROS negative  ____________________________________________   PHYSICAL EXAM:  VITAL SIGNS: ED Triage Vitals  Enc Vitals Group     BP 05/31/18 1058 (!) 151/119     Pulse Rate  05/31/18 1058 (!) 118     Resp 05/31/18 1058 (!) 33     Temp 05/31/18 1058 99.4 F (37.4 C)     Temp Source 05/31/18 1058 Oral     SpO2 05/31/18 1052 93 %     Weight 05/31/18 1053 164 lb (74.4 kg)     Height 05/31/18 1053 5\' 2"  (1.575 m)     Head Circumference --      Peak Flow --      Pain Score 05/31/18 1053 0     Pain Loc --      Pain Edu? --      Excl. in [place]? --    Constitutional: Alert and oriented. Well appearing and in no distress. Eyes: Normal exam ENT   Head: Normocephalic and atraumatic.   Mouth/Throat: Mucous membranes are moist. Cardiovascular: Normal rate, regular rhythm.  Respiratory: Normal respiratory effort without tachypnea nor retractions. Breath sounds are clear  Gastrointestinal: Soft and nontender. No distention.  Rectal examination shows nontender exam, dark black stool that is guaiac positive Musculoskeletal: Nontender with normal range of motion in all extremities. Neurologic:  Normal speech and language. No gross focal neurologic deficits Skin:  Skin is warm, dry and intact.  Psychiatric: Mood and affect are normal.  ____________________________________________    EKG  EKG viewed and interpreted by myself shows sinus tachycardia 114 bpm.  Narrow QRS, normal axis, largely normal intervals, nonspecific ST changes  ____________________________________________    RADIOLOGY  Chest x-ray shows likely multi lobar pneumonia.  ____________________________________________   INITIAL IMPRESSION / ASSESSMENT AND PLAN / ED COURSE  Pertinent labs & imaging results that were available during my care of the patient were reviewed by me and considered in my medical decision making (see chart for details).  Patient presents to the emergency department for generalized weakness and shortness of breath cough with sputum production over the past several days.  On review of systems also states dark black stool for the past 1 week.  Differential at the time  would include COPD exacerbation, pneumonia, pneumothorax, ACS, anemia.  Rectal examination does show jet black stool guaiac positive.  We will start on a Protonix infusion for likely upper GI bleed.  Awaiting lab work.  Patient does have wheeze bilaterally with frequent cough and sputum production.  We will start on duo nebs, Solu-Medrol and continue to closely monitor.  We will obtain a chest x-ray to further evaluate.  Chest x-ray consistent with multi lobar pneumonia.  We will send blood cultures, as the patient is tachypneic, tachycardic with multifocal pneumonia on chest x-ray meeting sepsis criteria we will start on antibiotics to her IV patient will require admission to the hospitalist service.  Low-grade temperature 99.4.  Labs show elevated lactic acid 2.4.  White blood cell count of 15.8.  Patient will be admitted to the hospital service for multifocal pneumonia and possible GI bleed.  CRITICAL CARE Performed by: Harvest Dark   Total critical care time: 30 minutes  Critical care time was  exclusive of separately billable procedures and treating other patients.  Critical care was necessary to treat or prevent imminent or life-threatening deterioration.  Critical care was time spent personally by me on the following activities: development of treatment plan with patient and/or surrogate as well as nursing, discussions with consultants, evaluation of patient's response to treatment, examination of patient, obtaining history from patient or surrogate, ordering and performing treatments and interventions, ordering and review of laboratory studies, ordering and review of radiographic studies, pulse oximetry and re-evaluation of patient's condition.  ____________________________________________   FINAL CLINICAL IMPRESSION(S) / ED DIAGNOSES  Upper GI bleed Sepsis Pneumonia   Harvest Dark, MD 05/31/18 1425

## 2018-05-31 NOTE — ED Notes (Signed)
Date and time results received: 05/31/18 1:20 PM    Test: istat lactic acid Critical Value: 2.42  Name of Provider Notified: Dr. Mamie Nick  Orders Received 1L NS-given

## 2018-05-31 NOTE — Progress Notes (Signed)
Patient admitted to 2A 237 from ED. Report called and given to this RN. Patient oriented to floor and surroundings. Cal bell in reach.

## 2018-06-01 LAB — CBC
HCT: 30.8 % — ABNORMAL LOW (ref 36.0–46.0)
Hemoglobin: 9.4 g/dL — ABNORMAL LOW (ref 12.0–15.0)
MCH: 28.7 pg (ref 26.0–34.0)
MCHC: 30.5 g/dL (ref 30.0–36.0)
MCV: 94.2 fL (ref 80.0–100.0)
Platelets: 282 10*3/uL (ref 150–400)
RBC: 3.27 MIL/uL — AB (ref 3.87–5.11)
RDW: 14.7 % (ref 11.5–15.5)
WBC: 11.3 10*3/uL — ABNORMAL HIGH (ref 4.0–10.5)
nRBC: 0 % (ref 0.0–0.2)

## 2018-06-01 LAB — BASIC METABOLIC PANEL
ANION GAP: 6 (ref 5–15)
BUN: 15 mg/dL (ref 8–23)
CO2: 25 mmol/L (ref 22–32)
Calcium: 8.4 mg/dL — ABNORMAL LOW (ref 8.9–10.3)
Chloride: 111 mmol/L (ref 98–111)
Creatinine, Ser: 0.51 mg/dL (ref 0.44–1.00)
GFR calc Af Amer: >60 mL/min (ref >60)
GFR calc non Af Amer: >60 mL/min (ref >60)
Glucose, Bld: 130 mg/dL — ABNORMAL HIGH (ref 70–99)
POTASSIUM: 3.2 mmol/L — AB (ref 3.5–5.1)
Sodium: 142 mmol/L (ref 135–145)

## 2018-06-01 LAB — MAGNESIUM: Magnesium: 1.8 mg/dL (ref 1.7–2.4)

## 2018-06-01 LAB — GLUCOSE, CAPILLARY: Glucose-Capillary: 115 mg/dL — ABNORMAL HIGH (ref 70–99)

## 2018-06-01 MED ORDER — AZITHROMYCIN 250 MG PO TABS
500.0000 mg | ORAL_TABLET | Freq: Every day | ORAL | Status: DC
  Administered 2018-06-02: 500 mg via ORAL
  Filled 2018-06-01: qty 2

## 2018-06-01 MED ORDER — POTASSIUM CHLORIDE CRYS ER 20 MEQ PO TBCR
40.0000 meq | EXTENDED_RELEASE_TABLET | Freq: Two times a day (BID) | ORAL | Status: DC
  Administered 2018-06-01 – 2018-06-03 (×5): 40 meq via ORAL
  Filled 2018-06-01 (×5): qty 2

## 2018-06-01 MED ORDER — SODIUM CHLORIDE 0.9% FLUSH
10.0000 mL | Freq: Two times a day (BID) | INTRAVENOUS | Status: DC
  Administered 2018-06-01 – 2018-06-03 (×4): 10 mL via INTRAVENOUS

## 2018-06-01 MED ORDER — PANTOPRAZOLE SODIUM 40 MG PO TBEC
40.0000 mg | DELAYED_RELEASE_TABLET | Freq: Every day | ORAL | Status: DC
  Administered 2018-06-02 – 2018-06-03 (×2): 40 mg via ORAL
  Filled 2018-06-01 (×2): qty 1

## 2018-06-01 NOTE — Progress Notes (Addendum)
[place] [facility] [place] NAME: Wilfred Siverson    MR#:  [id number]  DATE OF BIRTH:  May 24, 1941  SUBJECTIVE:   Patient states she continues to be short of breath, worse when moving around.  She has not had on any additional dark stools.  No hematochezia.  No abdominal pain.  Last bowel movement was yesterday morning.  REVIEW OF SYSTEMS:  Review of Systems  Constitutional: Negative for chills and fever.  HENT: Negative for congestion and sore throat.   Eyes: Negative for blurred vision and double vision.  Respiratory: Positive for cough and shortness of breath.   Cardiovascular: Negative for chest pain, palpitations and leg swelling.  Gastrointestinal: Negative for abdominal pain, blood in stool, constipation, diarrhea, melena, nausea and vomiting.  Genitourinary: Negative for dysuria and urgency.  Musculoskeletal: Negative for back pain and neck pain.  Neurological: Negative for dizziness and headaches.  Psychiatric/Behavioral: Negative for depression. The patient is not nervous/anxious.     DRUG ALLERGIES:   Allergies  Allergen Reactions  . Rivaroxaban Hives   VITALS:  Blood pressure 102/61, pulse (!) 102, temperature 98.3 F (36.8 C), temperature source Oral, resp. rate 20, height 5\' 2"  (1.575 m), weight 66.3 kg, SpO2 93 %. PHYSICAL EXAMINATION:  Physical Exam  GENERAL:  78 y.o.-year-old patient lying in the bed with no acute distress.  EYES: Pupils equal, round, reactive to light . No scleral icterus. Extraocular muscles intact.  HEENT: Head atraumatic, normocephalic. Oropharynx and nasopharynx clear.  NECK:  Supple, no jugular venous distention. No thyroid enlargement, no tenderness.  LUNGS:.  +expiratory wheezing all lung fields.  Able to speak in full sentences, nasal cannula in place. CARDIOVASCULAR: S1, S2, RRR, no murmurs, rubs, or gallops.  ABDOMEN: Soft, nontender, nondistended. Bowel sounds present. No organomegaly or mass.    EXTREMITIES: No pedal edema, cyanosis, or clubbing.  NEUROLOGIC: Cranial nerves II through XII are intact. Muscle strength 5/5 in all extremities. Sensation intact. Gait not checked.  PSYCHIATRIC: The patient is alert and oriented x 3.  SKIN: No obvious rash, lesion, or ulcer.  LABORATORY PANEL:  Female CBC Recent Labs  Lab 06/01/18 0512  WBC 11.3*  HGB 9.4*  HCT 30.8*  PLT 282   ------------------------------------------------------------------------------------------------------------------ Chemistries  Recent Labs  Lab 05/31/18 1300 06/01/18 0512 06/01/18 1415  NA 138 142  --   K 2.8* 3.2*  --   CL 104 111  --   CO2 24 25  --   GLUCOSE 138* 130*  --   BUN 14 15  --   CREATININE 0.69 0.51  --   CALCIUM 9.3 8.4*  --   MG  --   --  1.8  AST 20  --   --   ALT 12  --   --   ALKPHOS 52  --   --   BILITOT 0.7  --   --    RADIOLOGY:  No results found. 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  1.Sepsis present on admission secondary to pneumonia: Lactic acidosis has resolved.  Sepsis resolved.  WBC 11.3 this morning. Afebrile overnight.  Will follow-up blood and sputum cultures.  Fluids and encourage p.o. intake. 2. Acute on chronic respiratory failure secondary to bilateral lower lobe pneumonia, continue  IV Rocephin.  Change azithromycin from IV to p.o.  Has been weaned to home 3 L O2.  3. Severe hypokalemia: Improving.  Continue to replete potassium and mag.  Recheck in the morning. 4.  Sinus tachycardia-likely due to sepsis.  Improved this morning with fluids.  Continue to monitor on telemetry.  Will stop IV fluids today. 5. Paroxysmal atrial fibrillation: Normal sinus rhythm here. continue Eliquis 6. 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  No signs of stroke here. 7. Concern for upper GI bleed and episode of melena.  Patient states she had a nosebleed prior to the episode of melena and thinks she swallowed some blood.  Hemoglobin has dropped a little bit, the patient has not had any additional bleeding episodes.  Will continue to monitor.  Transition IV Protonix to po Protonix.  All the records are reviewed and case discussed with Care Management/Social Worker. Management plans discussed with the patient, family and they are in agreement.  CODE STATUS: Full Code  TOTAL TIME TAKING CARE OF THIS PATIENT: 40 minutes.   More than 50% of the time was spent in counseling/coordination of care: YES  POSSIBLE D/C tomorrow, DEPENDING ON CLINICAL CONDITION.   Berna Spare Kaycee Haycraft M.D on 06/01/2018 at 5:59 PM  Between 7am to 6pm - Pager - [phone]  After 6pm go to [email or web address] - Proofreader  [facility]  Office  [phone]  CC: Primary care physician; Patient, No Pcp Per  Note: This dictation was prepared with Dragon dictation along with smaller phrase technology. Any transcriptional errors that result from this process are unintentional.

## 2018-06-01 NOTE — Care Management Note (Signed)
Case Management Note  Patient Details  Name: Christine Morris MRN: [id number] Date of Birth: 1940-10-21  Subjective/Objective:    Patient is from home with boyfriend.  Admitted with sepsis pneumonia.   Receiving IV antibiotics.  She is on 3L chronic oxygen.  She uses [facility] in [place].  Current with PCP.  CM consult for medication needs.  Patient is on Eliquis chronically.  She states right now it is approximately $100.00/ 90 day supply.  She has medicare part D coverage.  She cannot remember if she has used the 30 day free coupon before.  Would be unable to use the 30 day free coupon as this is not a new medication for her. Denies difficulties accessing medical care or with transportation.  Offered home health services at discharge; she is declining.                                                                                                                                               Action/Plan:   Expected Discharge Date:                  Expected Discharge Plan:  Home/Self Care  In-House Referral:     Discharge planning Services  CM Consult  Post Acute Care Choice:    Choice offered to:     DME Arranged:    DME Agency:     HH Arranged:  Patient Refused [place] Agency:     Status of Service:  In process, will continue to follow  If discussed at Long Length of Stay Meetings, dates discussed:    Additional Comments:  Elza Rafter, RN 06/01/2018, 3:00 PM

## 2018-06-01 NOTE — Clinical Social Work Note (Signed)
CSW received consult that patient needs assistance with paying for medications, CSW informed case manager, this CSW signing off.  Please reconsult if social work needs arise.  Jones Broom. [place], MSW, [place]  06/01/2018 10:13 AM

## 2018-06-01 NOTE — Plan of Care (Signed)
  Problem: Clinical Measurements: Goal: Diagnostic test results will improve Outcome: Progressing Goal: Signs and symptoms of infection will decrease Outcome: Progressing   Problem: Respiratory: Goal: Ability to maintain adequate ventilation will improve Outcome: Progressing

## 2018-06-02 LAB — CBC
HCT: 27.9 % — ABNORMAL LOW (ref 36.0–46.0)
Hemoglobin: 8.4 g/dL — ABNORMAL LOW (ref 12.0–15.0)
MCH: 28.2 pg (ref 26.0–34.0)
MCHC: 30.1 g/dL (ref 30.0–36.0)
MCV: 93.6 fL (ref 80.0–100.0)
Platelets: 273 10*3/uL (ref 150–400)
RBC: 2.98 MIL/uL — AB (ref 3.87–5.11)
RDW: 14.9 % (ref 11.5–15.5)
WBC: 10.9 10*3/uL — ABNORMAL HIGH (ref 4.0–10.5)
nRBC: 0 % (ref 0.0–0.2)

## 2018-06-02 LAB — BASIC METABOLIC PANEL
Anion gap: 6 (ref 5–15)
BUN: 17 mg/dL (ref 8–23)
CHLORIDE: 110 mmol/L (ref 98–111)
CO2: 26 mmol/L (ref 22–32)
Calcium: 8.7 mg/dL — ABNORMAL LOW (ref 8.9–10.3)
Creatinine, Ser: 0.64 mg/dL (ref 0.44–1.00)
GFR calc Af Amer: >60 mL/min (ref >60)
GFR calc non Af Amer: >60 mL/min (ref >60)
Glucose, Bld: 117 mg/dL — ABNORMAL HIGH (ref 70–99)
POTASSIUM: 3.7 mmol/L (ref 3.5–5.1)
Sodium: 142 mmol/L (ref 135–145)

## 2018-06-02 LAB — MAGNESIUM: Magnesium: 1.8 mg/dL (ref 1.7–2.4)

## 2018-06-02 LAB — GLUCOSE, CAPILLARY: Glucose-Capillary: 99 mg/dL (ref 70–99)

## 2018-06-02 MED ORDER — IPRATROPIUM-ALBUTEROL 0.5-2.5 (3) MG/3ML IN SOLN
3.0000 mL | Freq: Four times a day (QID) | RESPIRATORY_TRACT | Status: DC
  Administered 2018-06-02 – 2018-06-03 (×4): 3 mL via RESPIRATORY_TRACT
  Filled 2018-06-02 (×4): qty 3

## 2018-06-02 MED ORDER — METHYLPREDNISOLONE SODIUM SUCC 40 MG IJ SOLR
40.0000 mg | INTRAMUSCULAR | Status: DC
  Administered 2018-06-03: 40 mg via INTRAVENOUS
  Filled 2018-06-02: qty 1

## 2018-06-02 NOTE — Progress Notes (Signed)
Christine Morris: Christine Morris    MR#:  [id number]  DATE OF BIRTH:  08-14-1940  SUBJECTIVE:    still has cough, shortness of breath, shortness of breath worse on ambulation. REVIEW OF SYSTEMS:  Review of Systems  Constitutional: Negative for chills and fever.  HENT: Negative for congestion and sore throat.   Eyes: Negative for blurred vision and double vision.  Respiratory: Positive for cough and shortness of breath.   Cardiovascular: Negative for chest pain, palpitations and leg swelling.  Gastrointestinal: Negative for abdominal pain, blood in stool, constipation, diarrhea, melena, nausea and vomiting.  Genitourinary: Negative for dysuria and urgency.  Musculoskeletal: Negative for back pain and neck pain.  Neurological: Negative for dizziness and headaches.  Psychiatric/Behavioral: Negative for depression. The patient is not nervous/anxious.     DRUG ALLERGIES:   Allergies  Allergen Reactions  . Rivaroxaban Hives   VITALS:  Blood pressure (!) 141/85, pulse 91, temperature 98.2 F (36.8 C), resp. rate 19, height 5\' 2"  (1.575 m), weight 67.6 kg, SpO2 100 %. PHYSICAL EXAMINATION:  Physical Exam  GENERAL:  78 y.o.-year-old patient lying in the bed with no acute distress.  EYES: Pupils equal, round, reactive to light . No scleral icterus. Extraocular muscles intact.  HEENT: Head atraumatic, normocephalic. Oropharynx and nasopharynx clear.  NECK:  Supple, no jugular venous distention. No thyroid enlargement, no tenderness.  LUNGS:.  +expiratory wheezing all lung fields.  Able to speak in full sentences, nasal cannula in place. CARDIOVASCULAR: S1, S2, RRR, no murmurs, rubs, or gallops.  ABDOMEN: Soft, nontender, nondistended. Bowel sounds present. No organomegaly or mass.  EXTREMITIES: No pedal edema, cyanosis, or clubbing.  NEUROLOGIC: Cranial nerves II through XII are intact. Muscle strength 5/5 in all extremities.  Sensation intact. Gait not checked.  PSYCHIATRIC: The patient is alert and oriented x 3.  SKIN: No obvious rash, lesion, or ulcer.  LABORATORY PANEL:  Female CBC Recent Labs  Lab 06/02/18 0413  WBC 10.9*  HGB 8.4*  HCT 27.9*  PLT 273   ------------------------------------------------------------------------------------------------------------------ Chemistries  Recent Labs  Lab 05/31/18 1300  06/02/18 0413  NA 138   < > 142  K 2.8*   < > 3.7  CL 104   < > 110  CO2 24   < > 26  GLUCOSE 138*   < > 117*  BUN 14   < > 17  CREATININE 0.69   < > 0.64  CALCIUM 9.3   < > 8.7*  MG  --    < > 1.8  AST 20  --   --   ALT 12  --   --   ALKPHOS 52  --   --   BILITOT 0.7  --   --    < > = values in this interval not displayed.   RADIOLOGY:  No results found. 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  1.Sepsis present on admission secondary to pneumonia: Lactic acidosis has resolved.  Sepsis resolved.  Continue Rocephin, p.o. Zithromax.  2. Acute on chronic respiratory failure secondary to bilateral lower lobe pneumonia, continue IV Rocephin.  P.o. Zithromax, patient chronically on 3 L of oxygen at home.   3. Severe hypokalemia: Improving.  Continue to replete  potassium and mag.  Recheck in the morning. 4.  Sinus tachycardia-likely due to sepsis.  Improved this morning with fluids.  Continue to monitor on telemetry.  Will stop IV fluids today. 5. Paroxysmal atrial fibrillation: Normal sinus rhythm here. continue Eliquis 6. 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  No signs of stroke here. 7. Concern for upper GI bleed and episode of melena.  Patient states she had a nosebleed prior to  the episode of melena and thinks she swallowed some blood.  Globin stable, continue PPIs orally. Deconditioning, physical therapy consult  All the records are reviewed and case discussed with Care Management/Social Worker. Management plans discussed with the patient, family and they are in agreement.  CODE STATUS: Full Code  TOTAL TIME TAKING CARE OF THIS PATIENT: 40 minutes.   More than 50% of the time was spent in counseling/coordination of care: Christine Morris.   Christine Morris M.D on 06/02/2018 at 9:00 AM  Between 7am to 6pm - Pager - [phone]  After 6pm go to [email or web address] - Proofreader  [facility]  Office  [phone]  CC: Primary care physician; Patient, No Pcp Per  Note: This dictation was prepared with Dragon dictation along with smaller phrase technology. Any transcriptional errors that result from this process are unintentional.

## 2018-06-02 NOTE — Evaluation (Signed)
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    Clinical Impression  Pt A&Ox4 at start of session, no complaints of pain, very mild SOB at rest on 3L of O2, vitals WFLs. Pt reported living with husband in 1 story home, on O2 chronically (3L), ambulates with rollator, mod I with ADLs, husband performs most IADLs.   The patient demonstrated bed mobility mod I, and transferred with supervision/CGA during session. Ambulated ~19ft on 3L via , RW, CGA, spO2 87%, cued for standing rest break and pursed lip breathing with momentary improvement in saturation levels. Placed on 4L for remaining mobility, spO2 89-91%. Pt with quick turns with RW, impatient, exhibited decreased stride length bilaterally, trunk flexed. Returned to room and 3L vitals WFLs, moderately SOB. Overall the patient demonstrated limitations (see "PT Problem List") and would benefit from further skilled PT intervention. Recommendation HHPT.    Follow Up Recommendations Home health PT    Equipment Recommendations  None recommended by PT;Other (comment)(Pt has rollator that she uses at baseline)    Recommendations for Other Services       Precautions / Restrictions Precautions Precautions: Fall Restrictions Weight Bearing Restrictions: No      Mobility  Bed Mobility Overal bed mobility: Modified Independent                Transfers Overall transfer level: Needs assistance   Transfers: Sit to/from Stand Sit to Stand: Supervision;Min guard            Ambulation/Gait Ambulation/Gait assistance: Min guard Gait Distance (Feet): 70  Feet Assistive device: Rolling walker (2 wheeled)       General Gait Details: Trunk flexed, decreased stride length, narrow base of support. cues to improve AD management. 1-2 standing rest breaks for PLB cues.  Stairs            Wheelchair Mobility    Modified Rankin (Stroke Patients Only)       Balance Overall balance assessment: Needs assistance Sitting-balance support: Feet supported Sitting balance-Leahy Scale: Good     Standing balance support: No upper extremity supported Standing balance-Leahy Scale: Fair                               Pertinent Vitals/Pain Pain Assessment: No/denies pain    Home Living Family/patient expects to be discharged to:: Private residence Living Arrangements: Spouse/significant other Available Help at Discharge: Family;Available 24 hours/day Type of Home: Mobile home Home Access: Stairs to enter Entrance Stairs-Rails: Right;Left;Can reach both Entrance Stairs-Number of Steps: 5; usually holds rail and onto husband for stairs Home Layout: One level Home Equipment: Clinical cytogeneticist - 4 wheels;Cane - single point      Prior Function Level of Independence: Independent with assistive device(s)         Comments: 3L O2 at baseline a24/7, spouse performs IADLs.      Hand Dominance   Dominant Hand: Right    Extremity/Trunk Assessment   Upper Extremity Assessment Upper Extremity Assessment: Overall WFL for tasks assessed    Lower Extremity Assessment Lower Extremity Assessment: Generalized weakness       Communication  Communication: No difficulties  Cognition Arousal/Alertness: Awake/alert Behavior During Therapy: WFL for tasks assessed/performed Overall Cognitive Status: Within Functional Limits for tasks assessed                                        General Comments      Exercises     Assessment/Plan    PT Assessment Patient needs continued PT services  PT Problem List  Decreased strength;Cardiopulmonary status limiting activity;Decreased activity tolerance;Decreased knowledge of use of DME;Decreased balance;Decreased mobility;Decreased safety awareness       PT Treatment Interventions DME instruction;Therapeutic exercise;Gait training;Balance training;Stair training;Neuromuscular re-education;Functional mobility training;Therapeutic activities;Patient/family education    PT Goals (Current goals can be found in the Care Plan section)  Acute Rehab PT Goals Patient Stated Goal: Pt wants to breathe better PT Goal Formulation: With patient Time For Goal Achievement: 06/16/18 Potential to Achieve Goals: Good    Frequency Min 2X/week   Barriers to discharge        Co-evaluation               AM-PAC PT "6 Clicks" Mobility  Outcome Measure Help needed turning from your back to your side while in a flat bed without using bedrails?: None Help needed moving from lying on your back to sitting on the side of a flat bed without using bedrails?: None Help needed moving to and from a bed to a chair (including a wheelchair)?: A Little Help needed standing up from a chair using your arms (e.g., wheelchair or bedside chair)?: None Help needed to walk in hospital room?: A Little Help needed climbing 3-5 steps with a railing? : A Little 6 Click Score: 21    End of Session Equipment Utilized During Treatment: Gait belt;Oxygen(3-4L) Activity Tolerance: Patient tolerated treatment well;Other (comment)(Pt with mild SOB, no significant complaints of fatigue) Patient left: with chair alarm set;in chair;with call bell/phone within reach Nurse Communication: Mobility status;Other (comment)(oxygen needs while ambulating) PT Visit Diagnosis: Difficulty in walking, not elsewhere classified (R26.2);Muscle weakness (generalized) (M62.81);Other abnormalities of gait and mobility (R26.89)    Time: 5462-7035 PT Time Calculation (min) (ACUTE ONLY): 31 min   Charges:   PT  Evaluation $PT Eval Moderate Complexity: 1 Mod PT Treatments $Therapeutic Activity: 8-22 mins        Lieutenant Diego PT, DPT 2:33 PM,06/02/18 [phone]

## 2018-06-03 LAB — POTASSIUM: Potassium: 3.9 mmol/L (ref 3.5–5.1)

## 2018-06-03 LAB — GLUCOSE, CAPILLARY: GLUCOSE-CAPILLARY: 83 mg/dL (ref 70–99)

## 2018-06-03 MED ORDER — AMOXICILLIN-POT CLAVULANATE 875-125 MG PO TABS: 1.0000 | ORAL_TABLET | Freq: Two times a day (BID) | ORAL | 0 refills | Status: AC

## 2018-06-03 MED ORDER — PANTOPRAZOLE SODIUM 40 MG PO TBEC: 40.0000 mg | DELAYED_RELEASE_TABLET | Freq: Every day | ORAL | 1 refills | Status: DC

## 2018-06-03 MED ORDER — PREDNISONE 10 MG (21) PO TBPK: ORAL_TABLET | ORAL | 0 refills | Status: DC

## 2018-06-03 NOTE — Progress Notes (Signed)
78 year old female with history of COPD on chronic oxygen 3 L comes in because of shortness of breath, admitted for multilobar pneumonia, today she feels better, stable for discharge, discharge home with Augmentin for 5 days.  Discharge instructions are in the computer.  Will discharge home with Augmentin, prednisone dose taper, she can continue her oxygen, home dose inhalers, physical therapy recommended home health.  Physical therapy.

## 2018-06-03 NOTE — Progress Notes (Signed)
Patient given discharge instructions with husband at bedside. IV taken out and tele monitor off. Patient verbalized understanding with no further questions or concerns. Patient going home via family vehicle. Has declined home health. Patient's husband has brought he oxygen from home.

## 2018-06-03 NOTE — Plan of Care (Signed)
  Problem: Education: Goal: Knowledge of General Education information will improve Description Including pain rating scale, medication(s)/side effects and non-pharmacologic comfort measures Outcome: Adequate for Discharge   Problem: Health Behavior/Discharge Planning: Goal: Ability to manage health-related needs will improve Outcome: Adequate for Discharge   Problem: Clinical Measurements: Goal: Ability to maintain clinical measurements within normal limits will improve Outcome: Adequate for Discharge Goal: Will remain free from infection Outcome: Adequate for Discharge Goal: Diagnostic test results will improve Outcome: Adequate for Discharge Goal: Respiratory complications will improve Outcome: Adequate for Discharge Goal: Cardiovascular complication will be avoided Outcome: Adequate for Discharge   Problem: Activity: Goal: Risk for activity intolerance will decrease Outcome: Adequate for Discharge   Problem: Nutrition: Goal: Adequate nutrition will be maintained Outcome: Adequate for Discharge   Problem: Coping: Goal: Level of anxiety will decrease Outcome: Adequate for Discharge   Problem: Elimination: Goal: Will not experience complications related to bowel motility Outcome: Adequate for Discharge Goal: Will not experience complications related to urinary retention Outcome: Adequate for Discharge   Problem: Pain Managment: Goal: General experience of comfort will improve Outcome: Adequate for Discharge   Problem: Safety: Goal: Ability to remain free from injury will improve Outcome: Adequate for Discharge   Problem: Skin Integrity: Goal: Risk for impaired skin integrity will decrease Outcome: Adequate for Discharge   Problem: Fluid Volume: Goal: Hemodynamic stability will improve Outcome: Adequate for Discharge   Problem: Clinical Measurements: Goal: Diagnostic test results will improve Outcome: Adequate for Discharge Goal: Signs and symptoms of  infection will decrease Outcome: Adequate for Discharge   Problem: Respiratory: Goal: Ability to maintain adequate ventilation will improve Outcome: Adequate for Discharge

## 2018-06-05 LAB — CULTURE, BLOOD (ROUTINE X 2)
Culture: NO GROWTH
Culture: NO GROWTH
Special Requests: ADEQUATE

## 2018-06-10 NOTE — Discharge Summary (Signed)
Christine Morris, is a 78 y.o. female  DOB 1940/10/12  MRN [id number].  Admission date:  05/31/2018  Admitting Physician  Epifanio Lesches, MD  Discharge Date: 1/01-2019  Primary MD  Patient, No Pcp Per  Recommendations for primary care physician for things to follow:  Patient follow-up with Dr. Raul Del as an outpatient as scheduled Follow-up with primary cardiologist at [facility] as scheduled.    Admission Diagnosis  Weakness [R53.1] COPD exacerbation ([place]) [J44.1] Community acquired pneumonia, unspecified laterality [J18.9] Sepsis, due to unspecified organism, unspecified whether acute organ dysfunction present [facility]) [A41.9]   Discharge Diagnosis  Weakness [R53.1] COPD exacerbation ([place]) [J44.1] Community acquired pneumonia, unspecified laterality [J18.9] Sepsis, due to unspecified organism, unspecified whether acute organ dysfunction present ([place]) [A41.9] none  Active Problems:   Sepsis ([place])      Past Medical History:  Diagnosis Date  . COPD (chronic obstructive pulmonary disease) ([place])   . Vertigo     History reviewed. No pertinent surgical history.     History of present illness and  Hospital Course:     Kindly see H&P for history of present illness and admission details, please review complete Labs, Consult reports and Test reports for all details in brief  HPI  from the history and physical done on the day of admission 78 year old female patient admitted because of worsening shortness of breath, wheezing and found to have sepsis with multilobar pneumonia, admitted for the same.   Hospital Course  Acute on chronic respiratory failure secondary to multilobar pneumonia, patient required 4 L of oxygen but she is on 3 L at home, patient received oxygen, bronchodilators, IV antibiotics, steroids, symptoms  mildly improved.  2.  Sepsis present on admission but resolved now, secondary to pneumonia, received IV fluids, lactic acid trended down, patient blood cultures were negative received IV antibiotics for pneumonia including Rocephin, Zithromax, discharged home with Augmentin for 5 days along with prednisone dose taper, patient already on bronchodilators at home and she is advised to continue them along with oxygen. , #3 severe hypokalemia secondary to sepsis, resolved after replacement. 4.  Sinus tachycardia secondary to sepsis, resolved, admitted to telemetry and monitor on heart monitor. 5.  History of cryptogenic stroke, patient has implantable loop monitor, follows with [place]. 6.  Acute gastritis, patient complains of black stool to ER physician but did not complain any 2 hours.  Received Protonix infusion in the emergency room, hemoglobin stayed stable, received Protonix 40 mg every 12 hours IV in the hospital, discharged home with Protonix 40 mg P.o. daily. For depression, hypothyroidism patient can continue her home medicines.  Deconditioning, physical therapy recommended home health, discharged home with home health physical therapy  Discharge Condition: Stable   Follow UP  Follow-up Information    Erby Pian, MD. Schedule an appointment as soon as possible for a visit on 06/10/2018.   Specialty:  Specialist Why:  Appointment Time: @ 8:30 Contact information: [place] [id number] [phone]             Discharge Instructions  and  Discharge Medications      Allergies as of 06/03/2018      Reactions   Rivaroxaban Hives      Medication List    TAKE these medications   acetaminophen 500 MG tablet Commonly known as:  TYLENOL Take 1,000 mg by mouth every 6 (six) hours as needed for pain.   apixaban 5 MG Tabs tablet Commonly known as:  ELIQUIS  Take 5 mg by mouth every 12 (twelve) hours.   baclofen 10 MG tablet Commonly known  as:  LIORESAL Take 10 mg by mouth at bedtime.   BREO ELLIPTA 200-25 MCG/INH Aepb Generic drug:  fluticasone furoate-vilanterol Inhale 1 puff into the lungs daily.   calcitRIOL 0.5 MCG capsule Commonly known as:  ROCALTROL Take 0.5 mcg by mouth daily.   citalopram 20 MG tablet Commonly known as:  CELEXA Take 20 mg by mouth every morning.   escitalopram 20 MG tablet Commonly known as:  LEXAPRO Take 20 mg by mouth daily.   fenofibrate 160 MG tablet Take 160 mg by mouth daily.   ferrous gluconate 324 MG tablet Commonly known as:  FERGON Take 1 tablet by mouth daily with breakfast.   INCRUSE ELLIPTA 62.5 MCG/INH Aepb Generic drug:  umeclidinium bromide Inhale 1 puff into the lungs daily.   levothyroxine 88 MCG tablet Commonly known as:  SYNTHROID, LEVOTHROID Take 88 mcg by mouth daily.   montelukast 10 MG tablet Commonly known as:  SINGULAIR Take 10 mg by mouth every morning.   pantoprazole 40 MG tablet Commonly known as:  PROTONIX Take 1 tablet (40 mg total) by mouth daily.   predniSONE 10 MG (21) Tbpk tablet Commonly known as:  STERAPRED UNI-PAK 21 TAB Taper by 10 mg daily   simvastatin 40 MG tablet Commonly known as:  ZOCOR Take 40 mg by mouth daily.   vitamin B-12 1000 MCG tablet Commonly known as:  CYANOCOBALAMIN Take 1,000 mcg by mouth daily.   Vitamin D (Ergocalciferol) 1.25 MG (50000 UT) Caps capsule Commonly known as:  DRISDOL Take 50,000 Units by mouth once a week.     ASK your doctor about these medications   amoxicillin-clavulanate 875-125 MG tablet Commonly known as:  AUGMENTIN Take 1 tablet by mouth 2 (two) times daily for 5 days. Ask about: Should I take this medication?         Diet and Activity recommendation: See Discharge Instructions above   Consults obtained -physical therapy   Major procedures and Radiology Reports - PLEASE review detailed and final reports for all details, in brief -      Dg Chest 2 View  Result  Date: 05/31/2018 CLINICAL DATA:  Cough, shortness of breath EXAM: CHEST - 2 VIEW COMPARISON:  10/11/2017, 09/29/2012 FINDINGS: There is hazy bilateral lower lobe airspace disease and lingular airspace disease concerning for multilobar pneumonia. There is no pleural effusion or pneumothorax. The heart and mediastinal contours are unremarkable. The osseous structures are unremarkable. IMPRESSION: Hazy bilateral lower lobe airspace disease and lingular airspace disease concerning for multilobar pneumonia. Followup PA and lateral chest X-ray is recommended in 3-4 weeks following trial of antibiotic therapy to ensure resolution and exclude underlying malignancy. Electronically Signed   By: Kathreen Devoid   On: 05/31/2018 11:43    Micro Results     Recent Results (from the past 240 hour(s))  Blood Culture (routine x 2)     Status: None   Collection Time: 05/31/18  1:01 PM  Result Value Ref Range Status   Specimen Description BLOOD RIGHT ARM  Final   Special Requests   Final    BOTTLES DRAWN AEROBIC AND ANAEROBIC Blood Culture adequate volume   Culture   Final    NO GROWTH 5 DAYS Performed at [facility], [address], [place], [place] [address]    Report Status 06/05/2018 FINAL  Final  Blood Culture (routine x 2)     Status:  None   Collection Time: 05/31/18  1:01 PM  Result Value Ref Range Status   Specimen Description BLOOD LEFT ARM  Final   Special Requests   Final    BOTTLES DRAWN AEROBIC AND ANAEROBIC Blood Culture results may not be optimal due to an inadequate volume of blood received in culture bottles   Culture   Final    NO GROWTH 5 DAYS Performed at [facility], [address], [place], [place] [address]    Report Status 06/05/2018 FINAL  Final       Today   Subjective:   Christine Morris today has no further shortness of breath and eager to go home.  Discharged home in stable condition.  Objective:   Blood pressure (!) 148/99, pulse 73, temperature  98.1 F (36.7 C), temperature source Oral, resp. rate 18, height 5\' 2"  (1.575 m), weight 67.6 kg, SpO2 95 %.  No intake or output data in the 24 hours ending 06/10/18 1229  Exam Awake Alert, Oriented x 3, No new F.N deficits, Normal affect [place].AT,PERRAL Supple Neck,No JVD, No cervical lymphadenopathy appriciated.  Symmetrical Chest wall movement, Good air movement bilaterally, CTAB RRR,No Gallops,Rubs or new Murmurs, No Parasternal Heave +ve B.Sounds, Abd Soft, Non tender, No organomegaly appriciated, No rebound -guarding or rigidity. No Cyanosis, Clubbing or edema, No new Rash or bruise  Data Review   CBC w Diff:  Lab Results  Component Value Date   WBC 10.9 (H) 06/02/2018   HGB 8.4 (L) 06/02/2018   HGB 10.2 (L) 09/30/2012   HCT 27.9 (L) 06/02/2018   HCT 30.5 (L) 09/30/2012   PLT 273 06/02/2018   PLT 266 09/30/2012   LYMPHOPCT 4 05/31/2018   LYMPHOPCT 2.2 09/30/2012   MONOPCT 9 05/31/2018   MONOPCT 2.2 09/30/2012   EOSPCT 2 05/31/2018   EOSPCT 0.0 09/30/2012   BASOPCT 0 05/31/2018   BASOPCT 0.1 09/30/2012    CMP:  Lab Results  Component Value Date   NA 142 06/02/2018   NA 142 10/01/2012   K 3.9 06/03/2018   K 3.5 10/01/2012   CL 110 06/02/2018   CL 108 (H) 10/01/2012   CO2 26 06/02/2018   CO2 28 10/01/2012   BUN 17 06/02/2018   BUN 15 10/01/2012   CREATININE 0.64 06/02/2018   CREATININE 0.60 10/01/2012   PROT 7.7 05/31/2018   PROT 7.2 09/29/2012   ALBUMIN 3.7 05/31/2018   ALBUMIN 3.6 09/29/2012   BILITOT 0.7 05/31/2018   BILITOT 0.7 09/29/2012   ALKPHOS 52 05/31/2018   ALKPHOS 71 09/29/2012   AST 20 05/31/2018   AST 28 09/29/2012   ALT 12 05/31/2018   ALT 20 09/29/2012  .   Total Time in preparing paper work, data evaluation and todays exam - 35 minutes  Epifanio Lesches M.D on 06/03/2018 at 12:29 PM    Note: This dictation was prepared with Dragon dictation along with smaller phrase technology. Any transcriptional errors that result from this  process are unintentional.

## 2019-10-07 ENCOUNTER — Other Ambulatory Visit: Payer: Self-pay | Admitting: Pulmonary Disease

## 2019-10-07 DIAGNOSIS — R9389 Abnormal findings on diagnostic imaging of other specified body structures: Secondary | ICD-10-CM

## 2019-10-26 ENCOUNTER — Ambulatory Visit
Admission: RE | Admit: 2019-10-26 | Discharge: 2019-10-26 | Disposition: A | Discharge status: Home / Self Care | Payer: Medicare Other | Source: Ambulatory Visit | Attending: Pulmonary Disease | Admitting: Pulmonary Disease

## 2019-10-26 ENCOUNTER — Other Ambulatory Visit: Payer: Self-pay

## 2019-10-26 DIAGNOSIS — R9389 Abnormal findings on diagnostic imaging of other specified body structures: Secondary | ICD-10-CM | POA: Insufficient documentation

## 2019-10-26 MED ORDER — IOHEXOL 300 MG/ML  SOLN
60.0000 mL | Freq: Once | INTRAMUSCULAR | Status: AC | PRN
  Administered 2019-10-26: 75 mL via INTRAVENOUS

## 2019-12-13 ENCOUNTER — Other Ambulatory Visit: Payer: Self-pay | Admitting: Pulmonary Disease

## 2019-12-13 DIAGNOSIS — J479 Bronchiectasis, uncomplicated: Secondary | ICD-10-CM

## 2019-12-16 ENCOUNTER — Observation Stay
Admission: EM | Admit: 2019-12-16 | Discharge: 2019-12-17 | Disposition: A | Discharge status: Home / Self Care | Payer: Medicare Other | Attending: Internal Medicine | Admitting: Internal Medicine

## 2019-12-16 ENCOUNTER — Other Ambulatory Visit: Payer: Self-pay

## 2019-12-16 ENCOUNTER — Emergency Department: Payer: Medicare Other

## 2019-12-16 DIAGNOSIS — Z87891 Personal history of nicotine dependence: Secondary | ICD-10-CM | POA: Insufficient documentation

## 2019-12-16 DIAGNOSIS — Z7901 Long term (current) use of anticoagulants: Secondary | ICD-10-CM | POA: Diagnosis not present

## 2019-12-16 DIAGNOSIS — I480 Paroxysmal atrial fibrillation: Secondary | ICD-10-CM | POA: Insufficient documentation

## 2019-12-16 DIAGNOSIS — R55 Syncope and collapse: Secondary | ICD-10-CM | POA: Diagnosis not present

## 2019-12-16 DIAGNOSIS — J449 Chronic obstructive pulmonary disease, unspecified: Secondary | ICD-10-CM

## 2019-12-16 DIAGNOSIS — Z20822 Contact with and (suspected) exposure to covid-19: Secondary | ICD-10-CM | POA: Insufficient documentation

## 2019-12-16 DIAGNOSIS — J9611 Chronic respiratory failure with hypoxia: Secondary | ICD-10-CM | POA: Diagnosis not present

## 2019-12-16 LAB — CBC
HCT: 29.7 % — ABNORMAL LOW (ref 36.0–46.0)
Hemoglobin: 8.8 g/dL — ABNORMAL LOW (ref 12.0–15.0)
MCH: 26.4 pg (ref 26.0–34.0)
MCHC: 29.6 g/dL — ABNORMAL LOW (ref 30.0–36.0)
MCV: 89.2 fL (ref 80.0–100.0)
Platelets: 448 10*3/uL — ABNORMAL HIGH (ref 150–400)
RBC: 3.33 MIL/uL — ABNORMAL LOW (ref 3.87–5.11)
RDW: 15.9 % — ABNORMAL HIGH (ref 11.5–15.5)
WBC: 10.2 10*3/uL (ref 4.0–10.5)
nRBC: 0 % (ref 0.0–0.2)

## 2019-12-16 LAB — BASIC METABOLIC PANEL
Anion gap: 7 (ref 5–15)
BUN: 13 mg/dL (ref 8–23)
CO2: 29 mmol/L (ref 22–32)
Calcium: 9.2 mg/dL (ref 8.9–10.3)
Chloride: 101 mmol/L (ref 98–111)
Creatinine, Ser: 0.78 mg/dL (ref 0.44–1.00)
GFR calc Af Amer: >60 mL/min (ref >60)
GFR calc non Af Amer: >60 mL/min (ref >60)
Glucose, Bld: 108 mg/dL — ABNORMAL HIGH (ref 70–99)
Potassium: 3.9 mmol/L (ref 3.5–5.1)
Sodium: 137 mmol/L (ref 135–145)

## 2019-12-16 LAB — TROPONIN I (HIGH SENSITIVITY)
Troponin I (High Sensitivity): 3 ng/L (ref <18)
Troponin I (High Sensitivity): 3 ng/L (ref <18)

## 2019-12-16 LAB — SARS CORONAVIRUS 2 BY RT PCR (HOSPITAL ORDER, PERFORMED IN ~~LOC~~ HOSPITAL LAB): SARS Coronavirus 2: NEGATIVE

## 2019-12-16 LAB — TSH: TSH: 2.587 u[IU]/mL (ref 0.350–4.500)

## 2019-12-16 MED ORDER — ESCITALOPRAM OXALATE 10 MG PO TABS
20.0000 mg | ORAL_TABLET | Freq: Every day | ORAL | Status: DC
  Administered 2019-12-17: 20 mg via ORAL
  Filled 2019-12-16: qty 2

## 2019-12-16 MED ORDER — FENOFIBRATE 160 MG PO TABS
160.0000 mg | ORAL_TABLET | Freq: Every day | ORAL | Status: DC
  Administered 2019-12-17: 160 mg via ORAL
  Filled 2019-12-16: qty 1

## 2019-12-16 MED ORDER — HYDRALAZINE HCL 20 MG/ML IJ SOLN
10.0000 mg | INTRAMUSCULAR | Status: DC | PRN
  Filled 2019-12-16: qty 0.5

## 2019-12-16 MED ORDER — LEVOTHYROXINE SODIUM 88 MCG PO TABS
88.0000 ug | ORAL_TABLET | Freq: Every day | ORAL | Status: DC
  Administered 2019-12-17: 88 ug via ORAL
  Filled 2019-12-16: qty 1

## 2019-12-16 MED ORDER — ACETAMINOPHEN 325 MG PO TABS: 650.0000 mg | ORAL_TABLET | Freq: Four times a day (QID) | ORAL | Status: DC | PRN

## 2019-12-16 MED ORDER — SENNOSIDES-DOCUSATE SODIUM 8.6-50 MG PO TABS: 1.0000 | ORAL_TABLET | Freq: Every evening | ORAL | Status: DC | PRN

## 2019-12-16 MED ORDER — CITALOPRAM HYDROBROMIDE 20 MG PO TABS: 20.0000 mg | ORAL_TABLET | ORAL | Status: DC

## 2019-12-16 MED ORDER — BACLOFEN 10 MG PO TABS
10.0000 mg | ORAL_TABLET | Freq: Every day | ORAL | Status: DC
  Filled 2019-12-16: qty 1

## 2019-12-16 MED ORDER — FERROUS GLUCONATE 324 (38 FE) MG PO TABS
324.0000 mg | ORAL_TABLET | Freq: Every day | ORAL | Status: DC
  Administered 2019-12-17: 324 mg via ORAL
  Filled 2019-12-16 (×2): qty 1

## 2019-12-16 MED ORDER — ONDANSETRON HCL 4 MG/2ML IJ SOLN: 4.0000 mg | Freq: Four times a day (QID) | INTRAMUSCULAR | Status: DC | PRN

## 2019-12-16 MED ORDER — PREDNISONE 10 MG (21) PO TBPK
ORAL_TABLET | Freq: Every day | ORAL | Status: DC
  Filled 2019-12-16: qty 21

## 2019-12-16 MED ORDER — MONTELUKAST SODIUM 10 MG PO TABS
10.0000 mg | ORAL_TABLET | ORAL | Status: DC
  Administered 2019-12-17: 10 mg via ORAL
  Filled 2019-12-16: qty 1

## 2019-12-16 MED ORDER — ALBUTEROL SULFATE (2.5 MG/3ML) 0.083% IN NEBU: 2.5000 mg | INHALATION_SOLUTION | RESPIRATORY_TRACT | Status: DC | PRN

## 2019-12-16 MED ORDER — FLUTICASONE FUROATE-VILANTEROL 200-25 MCG/INH IN AEPB
1.0000 | INHALATION_SPRAY | Freq: Every day | RESPIRATORY_TRACT | Status: DC
  Administered 2019-12-17: 10:00:00 1 via RESPIRATORY_TRACT
  Filled 2019-12-16: qty 28

## 2019-12-16 MED ORDER — UMECLIDINIUM BROMIDE 62.5 MCG/INH IN AEPB
1.0000 | INHALATION_SPRAY | Freq: Every day | RESPIRATORY_TRACT | Status: DC
  Administered 2019-12-17: 1 via RESPIRATORY_TRACT
  Filled 2019-12-16: qty 7

## 2019-12-16 MED ORDER — ONDANSETRON HCL 4 MG PO TABS: 4.0000 mg | ORAL_TABLET | Freq: Four times a day (QID) | ORAL | Status: DC | PRN

## 2019-12-16 MED ORDER — CALCITRIOL 0.25 MCG PO CAPS
0.5000 ug | ORAL_CAPSULE | Freq: Every day | ORAL | Status: DC
  Administered 2019-12-17: 0.5 ug via ORAL
  Filled 2019-12-16: qty 2

## 2019-12-16 MED ORDER — PANTOPRAZOLE SODIUM 40 MG PO TBEC
40.0000 mg | DELAYED_RELEASE_TABLET | Freq: Every day | ORAL | Status: DC
  Administered 2019-12-17: 40 mg via ORAL
  Filled 2019-12-16: qty 1

## 2019-12-16 MED ORDER — OXYCODONE HCL 5 MG PO TABS: 5.0000 mg | ORAL_TABLET | ORAL | Status: DC | PRN

## 2019-12-16 MED ORDER — VITAMIN B-12 1000 MCG PO TABS
1000.0000 ug | ORAL_TABLET | Freq: Every day | ORAL | Status: DC
  Administered 2019-12-17: 1000 ug via ORAL
  Filled 2019-12-16: qty 1

## 2019-12-16 MED ORDER — ACETAMINOPHEN 650 MG RE SUPP: 650.0000 mg | Freq: Four times a day (QID) | RECTAL | Status: DC | PRN

## 2019-12-16 MED ORDER — SIMVASTATIN 20 MG PO TABS
40.0000 mg | ORAL_TABLET | Freq: Every day | ORAL | Status: DC
  Administered 2019-12-17: 40 mg via ORAL
  Filled 2019-12-16: qty 2

## 2019-12-16 MED ORDER — TRAZODONE HCL 50 MG PO TABS: 25.0000 mg | ORAL_TABLET | Freq: Every evening | ORAL | Status: DC | PRN

## 2019-12-16 MED ORDER — IPRATROPIUM-ALBUTEROL 0.5-2.5 (3) MG/3ML IN SOLN
3.0000 mL | Freq: Once | RESPIRATORY_TRACT | Status: AC
  Administered 2019-12-16: 3 mL via RESPIRATORY_TRACT
  Filled 2019-12-16: qty 3

## 2019-12-16 MED ORDER — SODIUM CHLORIDE 0.9 % IV SOLN: INTRAVENOUS | Status: DC

## 2019-12-16 NOTE — H&P (Signed)
History and Physical    Christine Morris:[id number] DOB: 09-13-1940 DOA: 12/16/2019  PCP: Chase Picket, MD  Patient coming from: Home  I have personally briefly reviewed patient's old medical records in [place]  Chief Complaint: Syncope  HPI: Christine Morris is a 79 y.o. female with medical history significant of chronic hypoxic respiratory failure, advanced COPD, bronchiectasis, atrial fibrillation on Eliquis for anticoagulation who presents to the emergency department for evaluation of a syncopal event.  The patient states that she was at her normal state of health which she describes as a chronically short of breath.  She was sitting at home.  Soon after she got up from the toilet she had a syncopal event for which she has amnesia.  For her husband who witnessed the event the patient was unresponsive for a period of 1 minute.  EMS was called.  Patient was brought to the emergency department.  On initial evaluation in the emergency department patient is overall stable in no distress.  Vital signs are reassuring.  Patient is in atrial fibrillation although rate controlled.  On my evaluation patient appears thin and chronically ill.  However she is in no visible distress.  She answers all my questions appropriately.  She is on baseline 3 to 4 L nasal cannula.  Given her extensive medical history and the inability to exclude cardiogenic syncope as a cause patient will be placed in observation status and followed by hospitalist service  ED Course: Initial vital signs reassuring.  Laboratory investigation overall unrevealing.  Chest x-ray consistent with prior.  CT head negative for intracranial bleed.  Patient was fluid resuscitated.  Hospitalist service called for admission.  Review of Systems: As per HPI otherwise 14 point review of systems negative.    Past Medical History:  Diagnosis Date  . Atrial fibrillation ([place]) 2017  . COPD (chronic obstructive pulmonary disease)  ([place])   . Vertigo     History reviewed. No pertinent surgical history.   reports that she has quit smoking. She has never used smokeless tobacco. She reports that she does not drink alcohol and does not use drugs.  Allergies  Allergen Reactions  . Rivaroxaban Hives    Family history: No family history of syncope  Prior to Admission medications   Medication Sig Start Date End Date Taking? Authorizing Provider  acetaminophen (TYLENOL) 500 MG tablet Take 1,000 mg by mouth every 6 (six) hours as needed for pain.    [provider]  apixaban (ELIQUIS) 5 MG TABS tablet Take 5 mg by mouth every 12 (twelve) hours. 04/30/18   [provider]  baclofen (LIORESAL) 10 MG tablet Take 10 mg by mouth at bedtime.    [provider]  BREO ELLIPTA 200-25 MCG/INH AEPB Inhale 1 puff into the lungs daily.    [provider]  calcitRIOL (ROCALTROL) 0.5 MCG capsule Take 0.5 mcg by mouth daily.    [provider]  citalopram (CELEXA) 20 MG tablet Take 20 mg by mouth every morning.    [provider]  escitalopram (LEXAPRO) 20 MG tablet Take 20 mg by mouth daily.    [provider]  fenofibrate 160 MG tablet Take 160 mg by mouth daily.    [provider]  ferrous gluconate (FERGON) 324 MG tablet Take 1 tablet by mouth daily with breakfast.    [provider]  INCRUSE ELLIPTA 62.5 MCG/INH AEPB Inhale 1 puff into the lungs daily.    [provider]  levothyroxine (SYNTHROID, LEVOTHROID) 88 MCG tablet Take 88 mcg by mouth daily.    [provider]  montelukast (SINGULAIR) 10 MG tablet Take 10 mg by mouth every morning.    [provider]  pantoprazole (PROTONIX) 40 MG tablet Take 1 tablet (40 mg total) by mouth daily. 06/03/18 06/03/19  Epifanio Lesches, MD  predniSONE (STERAPRED UNI-PAK 21 TAB) 10 MG (21) TBPK tablet Taper by 10 mg daily 06/03/18   Epifanio Lesches, MD  simvastatin (ZOCOR) 40 MG tablet  Take 40 mg by mouth daily.    [provider]  vitamin B-12 (CYANOCOBALAMIN) 1000 MCG tablet Take 1,000 mcg by mouth daily.    [provider]  Vitamin D, Ergocalciferol, (DRISDOL) 1.25 MG (50000 UT) CAPS capsule Take 50,000 Units by mouth once a week.    [provider]    Physical Exam: Vitals:   12/16/19 1730 12/16/19 1859 12/16/19 1900 12/16/19 2002  BP: 128/84  (!) 116/56 (!) 106/61  Pulse:  64 (!) 139 95  Resp:  (!) 24 20 20   Temp:      TempSrc:      SpO2:  99% 93% 95%  Weight:      Height:        Vitals:   12/16/19 1730 12/16/19 1859 12/16/19 1900 12/16/19 2002  BP: 128/84  (!) 116/56 (!) 106/61  Pulse:  64 (!) 139 95  Resp:  (!) 24 20 20   Temp:      TempSrc:      SpO2:  99% 93% 95%  Weight:      Height:       Constitutional: NAD, calm, comfortable, thin, appears chronically ill Eyes: PERRL, lids and conjunctivae normal ENMT: Mucous membranes moist, pharynx clear of exudate, poor dentition Neck: normal, supple, no masses, no thyromegaly Respiratory: Nasal cannula in place, 3 to 4 L, diffusely decreased breath sounds bilaterally, mild bibasilar crackles Cardiovascular: Normal rate, irregular rhythm, no appreciable murmurs, no lower extremity edema Abdomen: Thin, nontender, nondistended, positive bowel sounds Musculoskeletal: Muscle wasting bilateral lower and upper extremities, good range of motion, no clubbing cyanosis Skin: Scattered ecchymoses and excoriations Neurologic: Cranial nerves grossly intact, sensation intact, strength 5/5 psychiatric: Normal judgment and insight. Alert and oriented x 3. Normal mood.    Labs on Admission: I have personally reviewed following labs and imaging studies  CBC: Recent Labs  Lab 12/16/19 1640  WBC 10.2  HGB 8.8*  HCT 29.7*  MCV 89.2  PLT 245*   Basic Metabolic Panel: Recent Labs  Lab 12/16/19 1640  NA 137  K 3.9  CL 101  CO2 29  GLUCOSE 108*  BUN 13  CREATININE 0.78  CALCIUM 9.2    GFR: Estimated Creatinine Clearance: 45.2 mL/min (by C-G formula based on SCr of 0.78 mg/dL). Liver Function Tests: No results for input(s): AST, ALT, ALKPHOS, BILITOT, PROT, ALBUMIN in the last 168 hours. No results for input(s): LIPASE, AMYLASE in the last 168 hours. No results for input(s): AMMONIA in the last 168 hours. Coagulation Profile: No results for input(s): INR, PROTIME in the last 168 hours. Cardiac Enzymes: No results for input(s): CKTOTAL, CKMB, CKMBINDEX, TROPONINI in the last 168 hours. BNP (last 3 results) No results for input(s): PROBNP in the last 8760 hours. HbA1C: No results for input(s): HGBA1C in the last 72 hours. CBG: No results for input(s): GLUCAP in the last 168 hours. Lipid Profile: No results for input(s): CHOL, HDL, LDLCALC, TRIG, CHOLHDL, LDLDIRECT in the last 72 hours. Thyroid Function  Tests: No results for input(s): TSH, T4TOTAL, FREET4, T3FREE, THYROIDAB in the last 72 hours. Anemia Panel: No results for input(s): VITAMINB12, FOLATE, FERRITIN, TIBC, IRON, RETICCTPCT in the last 72 hours. Urine analysis:    Component Value Date/Time   COLORURINE YELLOW (A) 10/11/2017 0223   APPEARANCEUR HAZY (A) 10/11/2017 0223   APPEARANCEUR Clear 09/30/2012 0829   LABSPEC 1.013 10/11/2017 0223   LABSPEC 1.012 09/30/2012 0829   PHURINE 6.0 10/11/2017 0223   GLUCOSEU NEGATIVE 10/11/2017 0223   GLUCOSEU 50 mg/dL 09/30/2012 0829   HGBUR SMALL (A) 10/11/2017 0223   BILIRUBINUR NEGATIVE 10/11/2017 0223   BILIRUBINUR Negative 09/30/2012 0829   KETONESUR NEGATIVE 10/11/2017 0223   PROTEINUR NEGATIVE 10/11/2017 0223   NITRITE POSITIVE (A) 10/11/2017 0223   LEUKOCYTESUR SMALL (A) 10/11/2017 0223   LEUKOCYTESUR Negative 09/30/2012 0829    Radiological Exams on Admission: CT Head Wo Contrast  Result Date: 12/16/2019 CLINICAL DATA:  Weakness, syncope, history of atrial fibrillation EXAM: CT HEAD WITHOUT CONTRAST TECHNIQUE: Contiguous axial images were obtained  from the base of the skull through the vertex without intravenous contrast. COMPARISON:  10/11/2017 FINDINGS: Brain: There is scattered hypodensities throughout the periventricular white matter and bilateral basal ganglia consistent with chronic small vessel ischemic changes, stable. No acute infarct or hemorrhage. Lateral ventricles and midline structures are unremarkable. There are scattered benign appearing cortical calcifications, most pronounced within the right frontal and right parietal lobes, stable. No acute extra-axial fluid collections. No mass effect. Vascular: No hyperdense vessel or unexpected calcification. Skull: Normal. Negative for fracture or focal lesion. Sinuses/Orbits: No acute finding. Other: None. IMPRESSION: 1. Stable exam, no acute intracranial process. Electronically Signed   By: Randa Ngo M.D.   On: 12/16/2019 19:47   DG Chest Portable 1 View  Result Date: 12/16/2019 CLINICAL DATA:  Syncope, pulmonary edema EXAM: PORTABLE CHEST 1 VIEW COMPARISON:  05/31/2018, CT 10/26/2019 FINDINGS: The lungs are hyperinflated in keeping with changes of underlying COPD. Reticulonodular infiltrate is seen at the lung bases bilaterally, left greater than right, progressive since prior examination in keeping with progressive atypical infection or recurrent aspiration. There is left hilar enlargement, similar to prior CT examination, in keeping with hilar adenopathy. No pneumothorax or pleural effusion. Implanted loop recorder partially visualized. Cardiac size within normal limits. Remote fracture deformity within the right shoulder. No acute bone abnormality. IMPRESSION: Progressive nodular infiltrate within the lung bases bilaterally, progressive infection versus recurring aspiration. Left hilar enlargement in keeping with known hilar adenopathy better seen on CT examination of 10/26/2019. Electronically Signed   By: Fidela Salisbury MD   On: 12/16/2019 17:37    EKG: Independently reviewed.   Atrial fibrillation, controlled ventricular rate  Assessment/Plan Active Problems:   Syncope  Syncope Patient had one episode of syncope soon after she got up from the toilet Suspect vasovagal syncope Difficult to exclude cardiogenic cause Patient is neurologically intact on my evaluation Plan: Place in observation Gentle IV fluids Hold home Eliquis Continuous telemetry monitoring Twice daily orthostatics PT and OT consults If patient is stable and back to baseline can consider discharge on 12/17/2019  Atrial fibrillation Patient is on Eliquis, last dose 12/16/2019 CT head did not demonstrate any bleed Patient is rate controlled on my evaluation Does not appear to be on any rate control agents Add metoprolol as needed for rate control Holding Eliquis for now If stable can likely start Eliquis tomorrow  Hyperlipidemia Continue home statin  Anxiety and depression Continue home Lexapro  Hypothyroidism Continue home Synthroid  Check TSH  Advance COPD Chronic hypoxic respiratory failure Bronchiectasis, COPD related This appears stable Patient is known to Dr. Lanney Gins Baseline requirement 3 to 4 L nasal cannula We will continue home inhaler regimen As needed nebs Supplemental oxygen  DVT prophylaxis: SCDs  code Status: Full Family Communication: None at bedside.  Offered to call.  Patient declined Disposition Plan: Anticipate return to previous home environment within 2 midnights Consults called: None Admission status: Observation, MedSurg   Sidney Ace MD [facility]  If 7PM-7AM, please contact night-coverage  12/16/2019, 8:30 PM

## 2019-12-16 NOTE — ED Triage Notes (Signed)
Pt arrives via ems from home ems reports pt was  feeling weak, husband rolled pt to restroom, on the way back ems reports pt had syncopal episode. pt states she did not strain to have BM. Pt states she completely blacked out and woke up on couch. Hx copd, afib. Pt a&o x 4 on arrival  cbg 108  120/70  95% on 4L chronic.   20L AC

## 2019-12-16 NOTE — Progress Notes (Signed)
Report called to Remo Lipps on 1a. Patient given to transport team. Off unit.

## 2019-12-16 NOTE — ED Provider Notes (Signed)
[facility] Emergency Department Provider Note    First MD Initiated Contact with Patient 12/16/19 1634     (approximate)  I have reviewed the triage vital signs and the nursing notes.   HISTORY  Chief Complaint Loss of Consciousness    HPI Christine Morris is a 79 y.o. female with the below's past medical history presents to the ER after syncopal episode that occurred while she was going to the bathroom.  States she started feeling lightheaded and then felt like she was blacking out.  There is no seizure-like activity.  She did not fall.  Did not hit her head.  Denies any numbness or tingling.  She feels like she is back at her baseline.  Does not think that her oxygen got disconnected.  Denied any palpitations.  Denied any chest pain or pressure.    Past Medical History:  Diagnosis Date  . Atrial fibrillation ([place]) 2017  . COPD (chronic obstructive pulmonary disease) ([place])   . Vertigo    History reviewed. No pertinent family history. History reviewed. No pertinent surgical history. Patient Active Problem List   Diagnosis Date Noted  . Sepsis ([place]) 05/31/2018  . TIA (transient ischemic attack) 10/11/2017  . CVA (cerebral vascular accident) ([place]) 10/11/2017      Prior to Admission medications   Medication Sig Start Date End Date Taking? Authorizing Provider  acetaminophen (TYLENOL) 500 MG tablet Take 1,000 mg by mouth every 6 (six) hours as needed for pain.    [provider]  apixaban (ELIQUIS) 5 MG TABS tablet Take 5 mg by mouth every 12 (twelve) hours. 04/30/18   [provider]  baclofen (LIORESAL) 10 MG tablet Take 10 mg by mouth at bedtime.    [provider]  BREO ELLIPTA 200-25 MCG/INH AEPB Inhale 1 puff into the lungs daily.    [provider]  calcitRIOL (ROCALTROL) 0.5 MCG capsule Take 0.5 mcg by mouth daily.    [provider]  citalopram (CELEXA) 20 MG tablet Take 20 mg by mouth every  morning.    [provider]  escitalopram (LEXAPRO) 20 MG tablet Take 20 mg by mouth daily.    [provider]  fenofibrate 160 MG tablet Take 160 mg by mouth daily.    [provider]  ferrous gluconate (FERGON) 324 MG tablet Take 1 tablet by mouth daily with breakfast.    [provider]  INCRUSE ELLIPTA 62.5 MCG/INH AEPB Inhale 1 puff into the lungs daily.    [provider]  levothyroxine (SYNTHROID, LEVOTHROID) 88 MCG tablet Take 88 mcg by mouth daily.    [provider]  montelukast (SINGULAIR) 10 MG tablet Take 10 mg by mouth every morning.    [provider]  pantoprazole (PROTONIX) 40 MG tablet Take 1 tablet (40 mg total) by mouth daily. 06/03/18 06/03/19  Epifanio Lesches, MD  predniSONE (STERAPRED UNI-PAK 21 TAB) 10 MG (21) TBPK tablet Taper by 10 mg daily 06/03/18   Epifanio Lesches, MD  simvastatin (ZOCOR) 40 MG tablet Take 40 mg by mouth daily.    [provider]  vitamin B-12 (CYANOCOBALAMIN) 1000 MCG tablet Take 1,000 mcg by mouth daily.    [provider]  Vitamin D, Ergocalciferol, (DRISDOL) 1.25 MG (50000 UT) CAPS capsule Take 50,000 Units by mouth once a week.    [provider]    Allergies Rivaroxaban    Social History Social History   Tobacco Use  . Smoking status: Former  Smoker  . Smokeless tobacco: Never Used  Substance Use Topics  . Alcohol use: Never  . Drug use: Never    Review of Systems Patient denies headaches, rhinorrhea, blurry vision, numbness, shortness of breath, chest pain, edema, cough, abdominal pain, nausea, vomiting, diarrhea, dysuria, fevers, rashes or hallucinations unless otherwise stated above in HPI. ____________________________________________   PHYSICAL EXAM:  VITAL SIGNS: Vitals:   12/16/19 1859 12/16/19 1900  BP:  (!) 116/56  Pulse: 64 (!) 139  Resp: (!) 24 20  Temp:    SpO2: 99% 93%    Constitutional: Alert and oriented.  Eyes:  Conjunctivae are normal.  Head: Atraumatic. Nose: No congestion/rhinnorhea. Mouth/Throat: Mucous membranes are moist.   Neck: No stridor. Painless ROM.  Cardiovascular: Normal rate, regular rhythm. Grossly normal heart sounds.  Good peripheral circulation. Respiratory: Normal respiratory effort.  No retractions. Lungs CTAB. Gastrointestinal: Soft and nontender. No distention. No abdominal bruits. No CVA tenderness. Genitourinary:  Musculoskeletal: No lower extremity tenderness nor edema.  No joint effusions. Neurologic:  Normal speech and language. No gross focal neurologic deficits are appreciated. No facial droop Skin:  Skin is warm, dry and intact. No rash noted. Psychiatric: Mood and affect are normal. Speech and behavior are normal.  ____________________________________________   LABS (all labs ordered are listed, but only abnormal results are displayed)  Results for orders placed or performed during the hospital encounter of 12/16/19 (from the past 24 hour(s))  Basic metabolic panel     Status: Abnormal   Collection Time: 12/16/19  4:40 PM  Result Value Ref Range   Sodium 137 135 - 145 mmol/L   Potassium 3.9 3.5 - 5.1 mmol/L   Chloride 101 98 - 111 mmol/L   CO2 29 22 - 32 mmol/L   Glucose, Bld 108 (H) 70 - 99 mg/dL   BUN 13 8 - 23 mg/dL   Creatinine, Ser 0.78 0.44 - 1.00 mg/dL   Calcium 9.2 8.9 - 10.3 mg/dL   GFR calc non Af Amer >60 >60 mL/min   GFR calc Af Amer >60 >60 mL/min   Anion gap 7 5 - 15  CBC     Status: Abnormal   Collection Time: 12/16/19  4:40 PM  Result Value Ref Range   WBC 10.2 4.0 - 10.5 K/uL   RBC 3.33 (L) 3.87 - 5.11 MIL/uL   Hemoglobin 8.8 (L) 12.0 - 15.0 g/dL   HCT 29.7 (L) 36 - 46 %   MCV 89.2 80.0 - 100.0 fL   MCH 26.4 26.0 - 34.0 pg   MCHC 29.6 (L) 30.0 - 36.0 g/dL   RDW 15.9 (H) 11.5 - 15.5 %   Platelets 448 (H) 150 - 400 K/uL   nRBC 0.0 0.0 - 0.2 %  Troponin I (High Sensitivity)     Status: None   Collection Time: 12/16/19  4:40 PM    Result Value Ref Range   Troponin I (High Sensitivity) 3 <18 ng/L  Troponin I (High Sensitivity)     Status: None   Collection Time: 12/16/19  6:35 PM  Result Value Ref Range   Troponin I (High Sensitivity) 3 <18 ng/L   ____________________________________________  EKG My review and personal interpretation at Time: 16:28   Indication: syncope  Rate: 85  Rhythm: sinus Axis: normal Other: normal intervals, no stemi ____________________________________________  RADIOLOGY  I personally reviewed all radiographic images ordered to evaluate for the above acute complaints and reviewed radiology reports and findings.  These findings were personally discussed with the  patient.  Please see medical record for radiology report.  ____________________________________________   PROCEDURES  Procedure(s) performed:  Procedures    Critical Care performed: no ____________________________________________   INITIAL IMPRESSION / ASSESSMENT AND PLAN / ED COURSE  Pertinent labs & imaging results that were available during my care of the patient were reviewed by me and considered in my medical decision making (see chart for details).   DDX: dysrhythmia, dehydration, electrolyte abn, copd, chf, acs, vasovagal, orthostasis  Christine Morris is a 79 y.o. who presents to the ED with symptoms as described above.  Patient is chronically ill-appearing on supplemental oxygen presenting after syncopal episode.  Patient denies any chest pain at this time.  Is A. fib on the monitor.  Denies any worsening shortness of breath.  She is on Eliquis.  CT imaging ordered to evaluate for evidence of bleeding traumatic injury shows none.  She is having focal neuro deficits.  Have a lower suspicion for seizure but she is at high risk for cardiogenic syncope therefore discussed with hospitalist for admission and further medical management.     The patient was evaluated in Emergency Department today for the symptoms  described in the history of present illness. He/she was evaluated in the context of the global COVID-19 pandemic, which necessitated consideration that the patient might be at risk for infection with the SARS-CoV-2 virus that causes COVID-19. Institutional protocols and algorithms that pertain to the evaluation of patients at risk for COVID-19 are in a state of rapid change based on information released by regulatory bodies including the CDC and federal and state organizations. These policies and algorithms were followed during the patient's care in the ED.  As part of my medical decision making, I reviewed the following data within the [place] notes reviewed and incorporated, Labs reviewed, notes from prior ED visits and [place] Controlled Substance Database   ____________________________________________   FINAL CLINICAL IMPRESSION(S) / ED DIAGNOSES  Final diagnoses:  Syncope and collapse      NEW MEDICATIONS STARTED DURING THIS VISIT:  New Prescriptions   No medications on file     Note:  This document was prepared using Dragon voice recognition software and may include unintentional dictation errors.    Merlyn Lot, MD 12/16/19 2001

## 2019-12-17 DIAGNOSIS — R55 Syncope and collapse: Secondary | ICD-10-CM | POA: Diagnosis not present

## 2019-12-17 LAB — BASIC METABOLIC PANEL
Anion gap: 9 (ref 5–15)
BUN: 14 mg/dL (ref 8–23)
CO2: 25 mmol/L (ref 22–32)
Calcium: 9.1 mg/dL (ref 8.9–10.3)
Chloride: 104 mmol/L (ref 98–111)
Creatinine, Ser: 0.61 mg/dL (ref 0.44–1.00)
GFR calc Af Amer: >60 mL/min (ref >60)
GFR calc non Af Amer: >60 mL/min (ref >60)
Glucose, Bld: 96 mg/dL (ref 70–99)
Potassium: 3.5 mmol/L (ref 3.5–5.1)
Sodium: 138 mmol/L (ref 135–145)

## 2019-12-17 LAB — CBC
HCT: 26.9 % — ABNORMAL LOW (ref 36.0–46.0)
Hemoglobin: 8.1 g/dL — ABNORMAL LOW (ref 12.0–15.0)
MCH: 26.5 pg (ref 26.0–34.0)
MCHC: 30.1 g/dL (ref 30.0–36.0)
MCV: 87.9 fL (ref 80.0–100.0)
Platelets: 440 10*3/uL — ABNORMAL HIGH (ref 150–400)
RBC: 3.06 MIL/uL — ABNORMAL LOW (ref 3.87–5.11)
RDW: 15.9 % — ABNORMAL HIGH (ref 11.5–15.5)
WBC: 9.3 10*3/uL (ref 4.0–10.5)
nRBC: 0 % (ref 0.0–0.2)

## 2019-12-17 MED ORDER — APIXABAN 5 MG PO TABS
5.0000 mg | ORAL_TABLET | Freq: Two times a day (BID) | ORAL | Status: DC
  Administered 2019-12-17: 5 mg via ORAL
  Filled 2019-12-17: qty 1

## 2019-12-17 NOTE — Evaluation (Signed)
Physical Therapy Evaluation Patient Details Name: Christine Morris MRN: [id number] DOB: [birth year] Today's Date: 12/17/2019   History of Present Illness  Christine Morris is a 79 y.o. female with medical history significant of chronic hypoxic respiratory failure, advanced COPD, bronchiectasis, atrial fibrillation on Eliquis for anticoagulation who presents to the emergency department for evaluation of a syncopal event.  The patient states that she was at her normal state of health which she describes as a chronically short of breath.  She was sitting at home.  Soon after she got up from the toilet she had a syncopal event for which she has amnesia.  For her husband who witnessed the event the patient was unresponsive for a period of 1 minute.  Clinical Impression  Pt is a pleasant 79 year old F who was admitted for syncope, with PMH as described above. Pt encountered supine in bed; assessed orthostatic BP (see comments for objective measurements). Pt performs bed mobility (supine<>sit) with mod I, transfers with mod I without AD (STS from bedside); deferring ambulation this session due to hypotension (87/37 mmHg in standing, pt asymptomatic), returning pt to supine and instructed in light supine ther ex. Pt demos mild SOB once standing, which she reports is typical for her. Pt does not require physical assistance for bed mobility/transfers this session, though deferred assessing ambulation due to hypotension. Pt is otherwise functioning at baseline level.       Follow Up Recommendations No PT follow up    Equipment Recommendations  None recommended by PT (has rollator)    Recommendations for Other Services       Precautions / Restrictions Precautions Precautions: Fall Restrictions Weight Bearing Restrictions: No      Mobility  Bed Mobility Overal bed mobility: Modified Independent             General bed mobility comments: performs supine<>sit with mod I  Transfers Overall  transfer level: Modified independent   Transfers: Sit to/from Stand Sit to Stand: Modified independent (Device/Increase time)         General transfer comment: Performs STS from bedside without AD  Ambulation/Gait             General Gait Details: Deferred assessment today due to concerns of low blood pressure  Stairs            Wheelchair Mobility    Modified Rankin (Stroke Patients Only)       Balance Overall balance assessment: Needs assistance Sitting-balance support: Feet supported Sitting balance-Leahy Scale: Normal Sitting balance - Comments: able to weight shift and reach outside BOS seated EOB   Standing balance support: During functional activity;No upper extremity supported Standing balance-Leahy Scale: Good Standing balance comment: demos mild SOB following sit>stand though performs safely without AD                             Pertinent Vitals/Pain Pain Assessment: No/denies pain    Home Living Family/patient expects to be discharged to:: Private residence Living Arrangements: Spouse/significant other Available Help at Discharge: Family;Available 24 hours/day Type of Home: Mobile home Home Access: Stairs to enter Entrance Stairs-Rails: Can reach both Entrance Stairs-Number of Steps: 4 Home Layout: One level Home Equipment: Walker - 4 wheels;Shower seat      Prior Function Level of Independence: Independent with assistive device(s)         Comments: MOD I with rollator, assist from partner for IADLs. On 4L [place] (long  tube)     Hand Dominance        Extremity/Trunk Assessment   Upper Extremity Assessment Upper Extremity Assessment: Overall WFL for tasks assessed LUE Deficits / Details: Grossly 4/5 Bil LEs    Lower Extremity Assessment Lower Extremity Assessment: Overall WFL for tasks assessed;RLE deficits/detail;LLE deficits/detail RLE Deficits / Details: Grossly 4/5 Bil LEs       Communication    Communication: No difficulties  Cognition Arousal/Alertness: Awake/alert Behavior During Therapy: WFL for tasks assessed/performed Overall Cognitive Status: Within Functional Limits for tasks assessed                                        General Comments General comments (skin integrity, edema, etc.): orthostatic BP obtained, decreasing to 87/28mmHg in standing (deferring ambulation as a result), though pt denying any symptoms    Exercises Total Joint Exercises Ankle Circles/Pumps: 20 reps;Supine;AROM Quad Sets: AROM;Supine;10 reps   Assessment/Plan    PT Assessment Patient needs continued PT services  PT Problem List Decreased mobility;Decreased activity tolerance       PT Treatment Interventions DME instruction;Therapeutic exercise;Gait training;Balance training;Stair training;Neuromuscular re-education;Therapeutic activities;Patient/family education    PT Goals (Current goals can be found in the Care Plan section)  Acute Rehab PT Goals Patient Stated Goal: To return home  PT Goal Formulation: With patient Time For Goal Achievement: 12/17/19 Potential to Achieve Goals: Good    Frequency Min 2X/week   Barriers to discharge        Co-evaluation               AM-PAC PT "6 Clicks" Mobility  Outcome Measure Help needed turning from your back to your side while in a flat bed without using bedrails?: None Help needed moving from lying on your back to sitting on the side of a flat bed without using bedrails?: None Help needed moving to and from a bed to a chair (including a wheelchair)?: None Help needed standing up from a chair using your arms (e.g., wheelchair or bedside chair)?: None Help needed to walk in hospital room?: A Little Help needed climbing 3-5 steps with a railing? : A Little 6 Click Score: 22    End of Session Equipment Utilized During Treatment: Gait belt;Oxygen Activity Tolerance: Other (comment);Patient tolerated treatment well  (Pt demos orthostatic hypotension, though asymptomatic) Patient left: in bed;with call bell/phone within reach;with family/visitor present Nurse Communication: Mobility status (BP response) PT Visit Diagnosis: Difficulty in walking, not elsewhere classified (R26.2);History of falling (Z91.81);Other (comment) (activity tolerance, BP response to positional changes)    Time: 4888-9169 PT Time Calculation (min) (ACUTE ONLY): 23 min   Charges:   PT Evaluation $PT Eval Moderate Complexity: 1 Mod          Petra Kuba, PT, DPT 12/17/19, 3:40 PM

## 2019-12-17 NOTE — Progress Notes (Signed)
Occupational Therapy Evaluation Patient Details Name: Christine Morris MRN: [id number] DOB: [birth year] Today's Date: 12/17/2019    History of Present Illness Christine Morris is a 79 y.o. female with medical history significant of chronic hypoxic respiratory failure, advanced COPD, bronchiectasis, atrial fibrillation on Eliquis for anticoagulation who presents to the emergency department for evaluation of a syncopal event.  The patient states that she was at her normal state of health which she describes as a chronically short of breath.  She was sitting at home.  Soon after she got up from the toilet she had a syncopal event for which she has amnesia.  For her husband who witnessed the event the patient was unresponsive for a period of 1 minute.   Clinical Impression   Christine Morris was seen for OT evaluation this date. Pt was MOD I in all ADL and functional mobility using 4WW, living in a mobile home c 4 STE and B rails. Pt on 4 liters of long tube O2 at home. Pt reports becoming easily fatigued or out of breath with minimal exertion. Pt currently requires SUPERVISION for OOB mobility/ADLs due to current functional impairments (See OT Problem List below). Pt educated in energy conservation strategies including pursed lip breathing, activity pacing, home/routines modifications, work simplification, AE/DME, prioritizing of meaningful occupations, and falls prevention. Pt verbalized understanding and would benefit from additional skilled OT services to maximize recall and carryover of learned techniques and facilitate implementation of learned techniques into daily routines. Upon discharge, recommend no follow up OT services at discharge.        Follow Up Recommendations  No OT follow up    Equipment Recommendations  3 in 1 bedside commode    Recommendations for Other Services       Precautions / Restrictions Precautions Precautions: Fall Restrictions Weight Bearing Restrictions: No       Mobility Bed Mobility Overal bed mobility: Modified Independent                Transfers Overall transfer level: Needs assistance Equipment used: Rolling walker (2 wheeled) Transfers: Sit to/from Stand Sit to Stand: Supervision;From elevated surface         General transfer comment: Attempted to rise w/o use of RW in front of her, unable to achieve upright posture. Self-corrected to proper technique and stands c no physical assist. Fatigues quickly     Balance Overall balance assessment: Needs assistance Sitting-balance support: No upper extremity supported;Feet supported Sitting balance-Leahy Scale: Normal     Standing balance support: Single extremity supported Standing balance-Leahy Scale: Good Standing balance comment: Fatigues quickly                            ADL either performed or assessed with clinical judgement   ADL Overall ADL's : Needs assistance/impaired                                       General ADL Comments: SUPERVISION + RW for BSC t/f. Near baseline for seated ADLs     Vision         Perception     Praxis      Pertinent Vitals/Pain Pain Assessment: No/denies pain     Hand Dominance     Extremity/Trunk Assessment Upper Extremity Assessment Upper Extremity Assessment: Generalized weakness (BUE AROM WFL grossly)   Lower Extremity Assessment Lower  Extremity Assessment: Generalized weakness       Communication Communication Communication: No difficulties   Cognition Arousal/Alertness: Awake/alert Behavior During Therapy: WFL for tasks assessed/performed Overall Cognitive Status: Within Functional Limits for tasks assessed                                     General Comments  Seated EOB: SpO2 88% on 3L [place]. Standing at EOB: SpO2 83% on 3L [place] resolved to 88% c PLB, HR 120s    Exercises Exercises: Other exercises Other Exercises Other Exercises: Pt educated re: OT role, DME recs,  d/c recs, ECS, home//routines modifications Other Exercises: Self-feeding, LBD, sit>sup, bed mobility, sit<>stand, sitting/standing balance/tolerance, functional reach   Shoulder Instructions      Home Living Family/patient expects to be discharged to:: Private residence Living Arrangements: Spouse/significant other Available Help at Discharge: Family;Available 24 hours/day Type of Home: Mobile home Home Access: Stairs to enter Entrance Stairs-Number of Steps: 4 Entrance Stairs-Rails: Can reach both Home Layout: One level     Bathroom Shower/Tub: Astronomer Accessibility: Yes How Accessible: Accessible via walker Home Equipment: [place] - 4 wheels;Shower seat   Additional Comments: small terrier dog       Prior Functioning/Environment Level of Independence: Independent with assistive device(s)        Comments: MOD I c 4WW, assist from partner for IADLs. On 4L [place] chroniclly (long tube)        OT Problem List: Decreased strength;Decreased activity tolerance;Cardiopulmonary status limiting activity      OT Treatment/Interventions: Self-care/ADL training;Therapeutic exercise;Energy conservation;DME and/or AE instruction;Therapeutic activities;Patient/family education    OT Goals(Current goals can be found in the care plan section) Acute Rehab OT Goals Patient Stated Goal: To return home  OT Goal Formulation: With patient Time For Goal Achievement: 12/31/19 Potential to Achieve Goals: Good ADL Goals Pt Will Transfer to Toilet: with modified independence;ambulating;regular height toilet (c LRAD PRN) Additional ADL Goal #1: Pt will Independently verbalize plan to implement x3 falls prevention strategies. Additional ADL Goal #2: Pt will Independently verbalize plan to implement x3 ECS  OT Frequency: Min 1X/week   Barriers to D/C: Inaccessible home environment          Co-evaluation              AM-PAC OT "6 Clicks" Daily Activity     Outcome  Measure Help from another person eating meals?: None Help from another person taking care of personal grooming?: None Help from another person toileting, which includes using toliet, bedpan, or urinal?: A Little Help from another person bathing (including washing, rinsing, drying)?: A Little Help from another person to put on and taking off regular upper body clothing?: None Help from another person to put on and taking off regular lower body clothing?: A Little 6 Click Score: 21   End of Session Equipment Utilized During Treatment: Rolling walker;Oxygen (3L [place])  Activity Tolerance: Patient tolerated treatment well Patient left: in bed;with call bell/phone within reach;with bed alarm set  OT Visit Diagnosis: Other abnormalities of gait and mobility (R26.89)                Time: 7616-0737 OT Time Calculation (min): 14 min Charges:  OT General Charges $OT Visit: 1 Visit OT Evaluation $OT Eval Low Complexity: 1 Low OT Treatments $Self Care/Home Management : 8-22 mins   Dessie Coma, M.S. OTR/L  12/17/19, 11:16  AM

## 2019-12-17 NOTE — Discharge Summary (Signed)
Physician Discharge Summary  NIMSI MALES WCB:[id number] DOB: Oct 09, 1940 DOA: 12/16/2019  PCP: Chase Picket, MD  Admit date: 12/16/2019 Discharge date: 12/17/2019  Admitted From: Home Discharge disposition: Home   Code Status: Full Code  Diet Recommendation: Cardiac diet   Recommendations for Outpatient Follow-Up:   1. Follow-up with PCP as an outpatient 2. Follow-up with pulmonology as an outpatient  Discharge Diagnosis:   Active Problems:   Syncope   AF (paroxysmal atrial fibrillation) (HCC)   COPD with chronic bronchitis (HCC)   Chronic respiratory failure with hypoxia (HCC)    History of Present Illness / Brief narrative:  Christine Morris a 79 y.o.femalewith medical history significant ofchronic hypoxic respiratory failure, advanced COPD, bronchiectasis, atrial fibrillation on Eliquis for anticoagulation. Patient presented to the ED on 7/23 with complaint of a syncopal episode soon after she stood up from a toilet seat.  She was unresponsive for about a minute per her husband who witnessed the event.  EMS brought her to the ED.    In the ED, patient was alert, awake, oriented.  Vital signs are reassuring.  She was in rate controlled A. Fib. Laboratory stable BMP, CBC with hemoglobin low at 8.8 TSH normal at 2.58 CT head normal. Chest x-ray showed progressive nodular infiltrate within the lung bases bilaterally, progressive infection versus recurring aspiration.  Given her extensive medical history and the inability to exclude cardiogenic syncope as a cause patient was placed in observation status under hospitalist service.   Hospital Course:  Syncope Orthostatic hypotension -Presented with 1 episode of syncope soon after standing up from commode  -Suspect vasovagal syncope.   -1 minute of unresponsiveness, no postictal state, normal status by the time of presentation to ED.   -Telemetry monitoring  did not show any abnormality -Orthostatic  blood pressure monitoring this morning showed a drop in blood pressure from 102/58 to 85/62 to 75/29 from lying down to sitting up to standing.  I was at the bedside while this was being done.  Patient did not feel any symptoms.  I offered her overnight stay with more hydration, patient wants to go home today.  I would keep her hydrated to this afternoon around 4 PM and then discharged to home.  I encouraged her to maintain oral hydration at home. -Not on any diuretics at home. -PT and OT eval obtained.   OT recommended 3 in 1 commode.  Advance COPD Chronic hypoxic respiratory failure Bronchiectasis, COPD related -Clinically stable. -Baseline 3 to 4 L oxygen by nasal -Chest x-ray showed progressive nodular infiltrate within the lung bases bilaterally, progressive infection versus recurring aspiration.,  No acute component. -Patient is known to pulmonologist Dr.Aleskerov -Continue bronchodilators  Chronic rate controlled atrial fibrillation -Home meds include Eliquis, not on any AV nodal blocking agent. -Continue Eliquis.  Hyperlipidemia Continue home statin  Anxiety and depression Continue home Lexapro  Hypothyroidism Continue home Synthroid TSH normal.  Wound care:    Subjective:  Seen and examined this morning.  Pleasant elderly thin built Caucasian female.  Sitting up at the edge of the bed.  Not in distress.  I was in the room when orthostatic blood pressure was being measured.  Patient had orthostatic drop in blood pressure without any symptoms.  Discharge Exam:   Vitals:   12/16/19 2302 12/17/19 0822 12/17/19 1205 12/17/19 1514  BP: (!) 121/61 (!) 119/44 (!) 111/54 (!) 124/95  Pulse: 93 67 84 103  Resp: 16 23 14    Temp: 98.2 F (36.8 C)  97.9 F (36.6 C) 98 F (36.7 C)   TempSrc: Oral Oral Oral   SpO2: 97% 94% 99%   Weight:      Height:        Body mass index is 19.31 kg/m.  General exam: Appears calm and comfortable.  Skin: No rashes, lesions or  ulcers. HEENT: Atraumatic, normocephalic, supple neck, no obvious bleeding Lungs: Mild scattered rhonchi bilaterally, no wheezing CVS: Regular rate and rhythm, no murmur GI/Abd soft, nontender, nondistended, bowel sound present CNS: Alert, awake and oriented x3 Psychiatry: Mood appropriate Extremities: No pedal edema, no calf tenderness  Discharge Instructions:   Discharge Instructions    Diet - low sodium heart healthy   Complete by: As directed    Increase activity slowly   Complete by: As directed       Follow-up Information    Sabapathi, Myriam Forehand, MD Follow up.   Specialty: Family Medicine Contact information: [place] [address] [phone]              Allergies as of 12/17/2019      Reactions   Rivaroxaban Hives      Medication List    TAKE these medications   acetaminophen 500 MG tablet Commonly known as: TYLENOL Take 1,000 mg by mouth every 6 (six) hours as needed for pain.   ammonium lactate 12 % cream Commonly known as: AMLACTIN Apply 1 application topically 2 (two) times daily.   apixaban 5 MG Tabs tablet Commonly known as: ELIQUIS Take 5 mg by mouth every 12 (twelve) hours.   azithromycin 250 MG tablet Commonly known as: ZITHROMAX Take 250 mg by mouth every Monday, Wednesday, and Friday.   Breo Ellipta 200-25 MCG/INH Aepb Generic drug: fluticasone furoate-vilanterol Inhale 1 puff into the lungs daily.   calcitRIOL 0.5 MCG capsule Commonly known as: ROCALTROL Take 0.5 mcg by mouth daily.   Daliresp 250 MCG Tabs Generic drug: Roflumilast Take 250 mcg by mouth daily.   escitalopram 20 MG tablet Commonly known as: LEXAPRO Take 20 mg by mouth daily.   fenofibrate 160 MG tablet Take 160 mg by mouth daily.   ferrous gluconate 324 MG tablet Commonly known as: FERGON Take 324 mg by mouth daily with breakfast.   Incruse Ellipta 62.5 MCG/INH Aepb Generic drug: umeclidinium bromide Inhale 1 puff into the lungs  daily.   levothyroxine 75 MCG tablet Commonly known as: SYNTHROID Take 75 mcg by mouth daily.   montelukast 10 MG tablet Commonly known as: SINGULAIR Take 10 mg by mouth every morning.   potassium chloride 10 MEQ tablet Commonly known as: KLOR-CON Take 10 mEq by mouth 2 (two) times daily.   simvastatin 40 MG tablet Commonly known as: ZOCOR Take 40 mg by mouth every evening.   tobramycin (PF) 300 MG/5ML nebulizer solution Commonly known as: TOBI Take 300 mg by nebulization 2 (two) times daily. (use for 28 days then off for 28 days)   triamcinolone ointment 0.1 % Commonly known as: KENALOG Apply 1 application topically 2 (two) times daily.   vitamin B-12 1000 MCG tablet Commonly known as: CYANOCOBALAMIN Take 1,000 mcg by mouth daily.            Durable Medical Equipment  (From admission, onward)         Start     Ordered   12/17/19 1434  For home use only DME 3 n 1  Once        12/17/19 1433  Time coordinating discharge: 35 minutes  The results of significant diagnostics from this hospitalization (including imaging, microbiology, ancillary and laboratory) are listed below for reference.    Procedures and Diagnostic Studies:   CT Head Wo Contrast  Result Date: 12/16/2019 CLINICAL DATA:  Weakness, syncope, history of atrial fibrillation EXAM: CT HEAD WITHOUT CONTRAST TECHNIQUE: Contiguous axial images were obtained from the base of the skull through the vertex without intravenous contrast. COMPARISON:  10/11/2017 FINDINGS: Brain: There is scattered hypodensities throughout the periventricular white matter and bilateral basal ganglia consistent with chronic small vessel ischemic changes, stable. No acute infarct or hemorrhage. Lateral ventricles and midline structures are unremarkable. There are scattered benign appearing cortical calcifications, most pronounced within the right frontal and right parietal lobes, stable. No acute extra-axial fluid  collections. No mass effect. Vascular: No hyperdense vessel or unexpected calcification. Skull: Normal. Negative for fracture or focal lesion. Sinuses/Orbits: No acute finding. Other: None. IMPRESSION: 1. Stable exam, no acute intracranial process. Electronically Signed   By: Randa Ngo M.D.   On: 12/16/2019 19:47   DG Chest Portable 1 View  Result Date: 12/16/2019 CLINICAL DATA:  Syncope, pulmonary edema EXAM: PORTABLE CHEST 1 VIEW COMPARISON:  05/31/2018, CT 10/26/2019 FINDINGS: The lungs are hyperinflated in keeping with changes of underlying COPD. Reticulonodular infiltrate is seen at the lung bases bilaterally, left greater than right, progressive since prior examination in keeping with progressive atypical infection or recurrent aspiration. There is left hilar enlargement, similar to prior CT examination, in keeping with hilar adenopathy. No pneumothorax or pleural effusion. Implanted loop recorder partially visualized. Cardiac size within normal limits. Remote fracture deformity within the right shoulder. No acute bone abnormality. IMPRESSION: Progressive nodular infiltrate within the lung bases bilaterally, progressive infection versus recurring aspiration. Left hilar enlargement in keeping with known hilar adenopathy better seen on CT examination of 10/26/2019. Electronically Signed   By: Fidela Salisbury MD   On: 12/16/2019 17:37     Labs:   Basic Metabolic Panel: Recent Labs  Lab 12/16/19 1640 12/17/19 0433  NA 137 138  K 3.9 3.5  CL 101 104  CO2 29 25  GLUCOSE 108* 96  BUN 13 14  CREATININE 0.78 0.61  CALCIUM 9.2 9.1   GFR Estimated Creatinine Clearance: 45.2 mL/min (by C-G formula based on SCr of 0.61 mg/dL). Liver Function Tests: No results for input(s): AST, ALT, ALKPHOS, BILITOT, PROT, ALBUMIN in the last 168 hours. No results for input(s): LIPASE, AMYLASE in the last 168 hours. No results for input(s): AMMONIA in the last 168 hours. Coagulation profile No results for  input(s): INR, PROTIME in the last 168 hours.  CBC: Recent Labs  Lab 12/16/19 1640 12/17/19 0433  WBC 10.2 9.3  HGB 8.8* 8.1*  HCT 29.7* 26.9*  MCV 89.2 87.9  PLT 448* 440*   Cardiac Enzymes: No results for input(s): CKTOTAL, CKMB, CKMBINDEX, TROPONINI in the last 168 hours. BNP: Invalid input(s): POCBNP CBG: No results for input(s): GLUCAP in the last 168 hours. D-Dimer No results for input(s): DDIMER in the last 72 hours. Hgb A1c No results for input(s): HGBA1C in the last 72 hours. Lipid Profile No results for input(s): CHOL, HDL, LDLCALC, TRIG, CHOLHDL, LDLDIRECT in the last 72 hours. Thyroid function studies Recent Labs    12/16/19 1835  TSH 2.587   Anemia work up No results for input(s): VITAMINB12, FOLATE, FERRITIN, TIBC, IRON, RETICCTPCT in the last 72 hours. Microbiology Recent Results (from the past 240 hour(s))  SARS Coronavirus 2 by  RT PCR (hospital order, performed in [facility] hospital lab) Nasopharyngeal Nasopharyngeal Swab     Status: None   Collection Time: 12/16/19  8:05 PM   Specimen: Nasopharyngeal Swab  Result Value Ref Range Status   SARS Coronavirus 2 NEGATIVE NEGATIVE Final    Comment: (NOTE) SARS-CoV-2 target nucleic acids are NOT DETECTED.  The SARS-CoV-2 RNA is generally detectable in upper and lower respiratory specimens during the acute phase of infection. The lowest concentration of SARS-CoV-2 viral copies this assay can detect is 250 copies / mL. A negative result does not preclude SARS-CoV-2 infection and should not be used as the sole basis for treatment or other patient management decisions.  A negative result may occur with improper specimen collection / handling, submission of specimen other than nasopharyngeal swab, presence of viral mutation(s) within the areas targeted by this assay, and inadequate number of viral copies (<250 copies / mL). A negative result must be combined with clinical observations, patient history, and  epidemiological information.  Fact Sheet for Patients:   StrictlyIdeas.no  Fact Sheet for Healthcare Providers: BankingDealers.co.za  This test is not yet approved or  cleared by the [place] FDA and has been authorized for detection and/or diagnosis of SARS-CoV-2 by FDA under an Emergency Use Authorization (EUA).  This EUA will remain in effect (meaning this test can be used) for the duration of the COVID-19 declaration under Section 564(b)(1) of the Act, 21 U.S.C. section 360bbb-3(b)(1), unless the authorization is terminated or revoked sooner.  Performed at [facility], [address] [place], [place] [address]     Please note: You were cared for by a hospitalist during your hospital stay. Once you are discharged, your primary care physician will handle any further medical issues. Please note that NO REFILLS for any discharge medications will be authorized once you are discharged, as it is imperative that you return to your primary care physician (or establish a relationship with a primary care physician if you do not have one) for your post hospital discharge needs so that they can reassess your need for medications and monitor your lab values.  Signed: Terrilee Croak  [facility] 12/17/2019, 3:31 PM

## 2019-12-17 NOTE — Care Management Obs Status (Signed)
[facility] NOTIFICATION   Patient Details  Name: LANITRA BATTAGLINI MRN: [id number] Date of Birth: 17-Apr-1941   Medicare Observation Status Notification Given:  Other (see comment) (agreed verbally vis phone, unable to print working remote)    Boris Sharper, LCSW 12/17/2019, 2:37 PM

## 2019-12-26 ENCOUNTER — Other Ambulatory Visit: Payer: Self-pay

## 2019-12-26 ENCOUNTER — Ambulatory Visit
Admission: RE | Admit: 2019-12-26 | Discharge: 2019-12-26 | Disposition: A | Discharge status: Home / Self Care | Payer: Medicare Other | Source: Ambulatory Visit | Attending: Pulmonary Disease | Admitting: Pulmonary Disease

## 2019-12-26 DIAGNOSIS — I700 Atherosclerosis of aorta: Secondary | ICD-10-CM | POA: Insufficient documentation

## 2019-12-26 DIAGNOSIS — J479 Bronchiectasis, uncomplicated: Secondary | ICD-10-CM | POA: Insufficient documentation

## 2019-12-26 DIAGNOSIS — R599 Enlarged lymph nodes, unspecified: Secondary | ICD-10-CM | POA: Insufficient documentation

## 2019-12-26 LAB — GLUCOSE, CAPILLARY: Glucose-Capillary: 73 mg/dL (ref 70–99)

## 2019-12-26 MED ORDER — FLUDEOXYGLUCOSE F - 18 (FDG) INJECTION
5.6000 | Freq: Once | INTRAVENOUS | Status: AC | PRN
  Administered 2019-12-26: 5.839 via INTRAVENOUS

## 2020-02-15 ENCOUNTER — Other Ambulatory Visit: Payer: Self-pay | Admitting: Pulmonary Disease

## 2020-02-15 DIAGNOSIS — J479 Bronchiectasis, uncomplicated: Secondary | ICD-10-CM

## 2020-05-01 ENCOUNTER — Other Ambulatory Visit
Admission: RE | Admit: 2020-05-01 | Discharge: 2020-05-01 | Disposition: A | Discharge status: Home / Self Care | Payer: Medicare Other | Source: Ambulatory Visit | Attending: Pulmonary Disease | Admitting: Pulmonary Disease

## 2020-05-01 DIAGNOSIS — R0689 Other abnormalities of breathing: Secondary | ICD-10-CM | POA: Diagnosis present

## 2020-05-01 DIAGNOSIS — R0600 Dyspnea, unspecified: Secondary | ICD-10-CM | POA: Diagnosis present

## 2020-05-01 LAB — FIBRIN DERIVATIVES D-DIMER (ARMC ONLY): Fibrin derivatives D-dimer (ARMC): 1057.52 ng/mL (FEU) — ABNORMAL HIGH (ref 0.00–499.00)

## 2020-05-02 ENCOUNTER — Other Ambulatory Visit: Payer: Self-pay | Admitting: Pulmonary Disease

## 2020-05-02 DIAGNOSIS — R0600 Dyspnea, unspecified: Secondary | ICD-10-CM

## 2020-05-03 ENCOUNTER — Other Ambulatory Visit: Payer: Self-pay

## 2020-05-03 ENCOUNTER — Other Ambulatory Visit: Payer: Self-pay | Admitting: Pulmonary Disease

## 2020-05-03 ENCOUNTER — Other Ambulatory Visit: Payer: Self-pay | Admitting: Internal Medicine

## 2020-05-03 ENCOUNTER — Ambulatory Visit
Admission: RE | Admit: 2020-05-03 | Discharge: 2020-05-03 | Disposition: A | Discharge status: Home / Self Care | Payer: Medicare Other | Source: Ambulatory Visit | Attending: Pulmonary Disease | Admitting: Pulmonary Disease

## 2020-05-03 DIAGNOSIS — R7989 Other specified abnormal findings of blood chemistry: Secondary | ICD-10-CM

## 2020-05-03 LAB — POCT I-STAT CREATININE: Creatinine, Ser: 0.8 mg/dL (ref 0.44–1.00)

## 2020-05-03 MED ORDER — IOHEXOL 350 MG/ML SOLN
75.0000 mL | Freq: Once | INTRAVENOUS | Status: AC | PRN
  Administered 2020-05-03: 75 mL via INTRAVENOUS

## 2020-05-09 ENCOUNTER — Ambulatory Visit: Admission: RE | Admit: 2020-05-09 | Payer: Medicare Other | Source: Ambulatory Visit

## 2020-05-23 ENCOUNTER — Encounter
Admission: RE | Admit: 2020-05-23 | Discharge: 2020-05-23 | Disposition: A | Discharge status: Home / Self Care | Payer: Medicare Other | Source: Ambulatory Visit | Attending: Pulmonary Disease | Admitting: Pulmonary Disease

## 2020-05-23 ENCOUNTER — Other Ambulatory Visit: Payer: Self-pay

## 2020-05-23 HISTORY — DX: Osteonecrosis, unspecified: M87.9

## 2020-05-23 HISTORY — DX: Spinal stenosis, cervical region: M48.02

## 2020-05-23 HISTORY — DX: Spondylosis without myelopathy or radiculopathy, cervical region: M47.812

## 2020-05-23 HISTORY — DX: Hyperlipidemia, unspecified: E78.5

## 2020-05-23 HISTORY — DX: Anxiety disorder, unspecified: F41.9

## 2020-05-23 HISTORY — DX: Hypothyroidism, unspecified: E03.9

## 2020-05-23 HISTORY — DX: Lesion of radial nerve, unspecified upper limb: G56.30

## 2020-05-23 HISTORY — DX: Depression, unspecified: F32.A

## 2020-05-23 HISTORY — DX: Solitary pulmonary nodule: R91.1

## 2020-05-23 HISTORY — DX: Unspecified atrial fibrillation: I48.91

## 2020-05-23 HISTORY — DX: Cerebral infarction, unspecified: I63.9

## 2020-05-23 HISTORY — DX: Strain of extensor muscle, fascia and tendon of finger, unspecified finger at forearm level, initial encounter: S56.419A

## 2020-05-23 NOTE — Patient Instructions (Signed)
Your procedure is scheduled on: Wednesday May 30, 2020 Report to the Registration Desk on the 1st floor of the [facility]. To find out your arrival time, please call [phone] between 1PM - 3PM on: May 29, 2020 TUESDAY  REMEMBER: Instructions that are not followed completely may result in serious medical risk, up to and including death; or upon the discretion of your surgeon and anesthesiologist your surgery may need to be rescheduled.  Do not eat food after midnight the night before surgery.  No gum chewing, lozengers or hard candies.  You may however, drink CLEAR liquids up to 2 hours before you are scheduled to arrive for your surgery. Do not drink anything within 2 hours of your scheduled arrival time.  Clear liquids include: - water   TAKE THESE MEDICATIONS THE MORNING OF SURGERY WITH A SIP OF WATER: AZITHROMYCIN ESCITALOPRAM FENOFIBRATE LEVOTHYROXINE MONTELUKAST  Use inhalers AND NEBULIZERS on the day of surgery   Follow recommendations from Cardiologist, Pulmonologist or PCP regarding stopping Aspirin, Coumadin, Plavix, Eliquis, Pradaxa, or Pletal. LAST DOSE OF ELIQUIS May 24, 2020  One week prior to surgery: Stop Anti-inflammatories (NSAIDS) such as Advil, Aleve, Ibuprofen, Motrin, Naproxen, Naprosyn and Aspirin based products such as Excedrin, Goodys Powder, BC Powder. Stop ANY OVER THE COUNTER supplements until after surgery. (However, you may continue taking Vitamin D, Vitamin B, POTASSIUM, AND FERROUS and multivitamin up until the day before surgery.)  No Alcohol for 24 hours before or after surgery.  No Smoking including e-cigarettes for 24 hours prior to surgery.  No chewable tobacco products for at least 6 hours prior to surgery.  No nicotine patches on the day of surgery.  Do not use any "recreational" drugs for at least a week prior to your surgery.  Please be advised that the combination of cocaine and anesthesia may have negative outcomes, up  to and including death. If you test positive for cocaine, your surgery will be cancelled.  On the morning of surgery brush your teeth with toothpaste and water, you may rinse your mouth with mouthwash if you wish. Do not swallow any toothpaste or mouthwash.  Do not wear jewelry, make-up, hairpins, clips or nail polish.  Do not wear lotions, powders, or perfumes.   Do not shave body from the neck down 48 hours prior to surgery just in case you cut yourself which could leave a site for infection.  Also, freshly shaved skin may become irritated if using the CHG soap.  Contact lenses, hearing aids and dentures may not be worn into surgery.  Do not bring valuables to the hospital. [facility] is not responsible for any missing/lost belongings or valuables.   SHOWER DAY OF SURGERY  Notify your doctor if there is any change in your medical condition (cold, fever, infection).  Wear comfortable clothing (specific to your surgery type) to the hospital.  Plan for stool softeners for home use; pain medications have a tendency to cause constipation. You can also help prevent constipation by eating foods high in fiber such as fruits and vegetables and drinking plenty of fluids as your diet allows.  After surgery, you can help prevent lung complications by doing breathing exercises.  Take deep breaths and cough every 1-2 hours. Your doctor may order a device called an Incentive Spirometer to help you take deep breaths. When coughing or sneezing, hold a pillow firmly against your incision with both hands. This is called "splinting." Doing this helps protect your incision. It also decreases belly discomfort.  If you are being discharged the day of surgery, you will not be allowed to drive home. You will need a responsible adult (18 years or older) to drive you home and stay with you that night.   Please call the [facility] at [phone] if you have any questions about these  instructions.  Visitation Policy:  Patients undergoing a surgery or procedure may have one family member or support person with them as long as that person is not COVID-19 positive or experiencing its symptoms.  That person may remain in the waiting area during the procedure.  Inpatient Visitation Update:   In an effort to ensure the safety of our team members and our patients, we are implementing a change to our visitation policy:  Effective Monday, Aug. 9, at 7 a.m., inpatients will be allowed one support person.  o The support person may change daily.  o The support person must pass our screening, gel in and out, and wear a mask at all times, including in the patient's room.  o Patients must also wear a mask when staff or their support person are in the room.  o Masking is required regardless of vaccination status.  Systemwide, no visitors 17 or younger.

## 2020-05-23 NOTE — Progress Notes (Signed)
[facility] Perioperative Services  Pre-Admission/Anesthesia Testing Clinical Review  Date: 05/28/20  Patient Demographics:  Name: Christine Morris DOB:   1940-08-18 MRN:   [id number]  Planned Surgical Procedure(s):    Case: [id number] Date/Time: 05/30/20 1230   Procedures:      VIDEO BRONCHOSCOPY WITH ENDOBRONCHIAL NAVIGATION (N/A )     VIDEO BRONCHOSCOPY WITH ENDOBRONCHIAL ULTRASOUND (N/A )   Anesthesia type: General   Pre-op diagnosis: Lung Nodule R91.8   Location: [facility] PROCEDURE RM 02 / [facility] ORS FOR ANESTHESIA GROUP   Surgeons: Ottie Glazier, MD    NOTE: Available PAT nursing documentation and vital signs have been reviewed. Clinical nursing staff has updated patient's PMH/PSHx, current medication list, and drug allergies/intolerances to ensure comprehensive history available to assist in medical decision making as it pertains to the aforementioned surgical procedure and anticipated anesthetic course.   Clinical Discussion:  Christine Morris is a 79 y.o. female who is submitted for pre-surgical anesthesia review and clearance prior to her undergoing the above procedure. Patient is a Former Research scientist (life sciences). Pertinent PMH includes: atrial fibrillation, CVA, HLD, hypothyroidism, COPD, stenosis, depression, and anxiety.  Patient is under the care of pulmonary medicine for worsening dyspnea in the setting of advanced COPD. Last visit in the pulmonary clinic was on 05/01/2020; notes reviewed. Patient chronically on 3-4 L/min supplemental oxygen ATC.  CT imaging performed and 11/2019 revealed atypical granulomatous infection as well as mediastinal/hilar  LAD just possible bronchogenic neoplasm.  PET scan performed on 12/26/2019 revealed intensely hypermetabolic subcarinal and left hilar LAD; DDx included in COPD or lymphoma. The decision was made to pursue direct visualized and biopsy of areas of concern via ENB/EBUS.  Patient is followed by cardiology Christine Bucker, NP-C). She was last  seen in the cardiology clinic on 02/28/2020; notes reviewed.  At the time clinic visit, patient reporting worsening dyspnea and elevated heart rate with minimal exertion.  Patient has no experienced any obvious palpations similar to the previous experience with her atrial fibrillation. CHA2DS2-VASc Score = 5.  Patient chronically anticoagulated using apixaban; compliant with therapy with no evidence of bleeding.  Patient blood count noted to be trending down over the course of the last 3 years; recent H&H 8.4 and 28.1.  Patient on long-term oral iron supplementation; again denies any evidence of gastrointestinal bleeding.  Patient is status post cryptogenic CVA.  She has had an ILR placed.  Myocardial perfusion imaging study performed on 09/10/2015 revealed no evidence of stress-induced myocardial ischemia or arrhythmia; LVEF 62%. Last TTE performed on 10/11/2017 revealed normal left ventricular systolic function with an EF of 55-60% (see full interpretation of cardiovascular testing below).  Patient otherwise doing well from a cardiovascular perspective.  Given her weight, age, and GFR, her apixaban dose was 2.5 mg twice daily.  No other changes were made to her medication regimen.  Patient.  We will follow-up with outpatient cardiology in 1 year or sooner if needed.  Given patient's past medical history for cardiovascular diagnoses, presurgical cardiac clearance was sought by the attending surgeons office and PAT team.  Per cardiology, "this patient is optimized for surgery and may proceed with the planned procedural course with a LOW risk stratification". Again, patient is on daily anticoagulation therapy using apixaban.  He has been instructed on recommendations from pulmonary medicine for holding her apixaban for 5 days prior to her procedure. Patient aware that her last dose of apixaban will be on 05/24/2020.  She denies previous perioperative complications with anesthesia. She underwent  a MAC anesthetic  course at [facility] (ASA III) in 05/2015 with no documented complications.   Vitals with BMI 05/23/2020 12/17/2019 12/17/2019  Height _0  - -  Weight 109 lbs - -  BMI 09.81 - -  Systolic - 191 478  Diastolic - 95 54  Pulse - 103 84    Providers/Specialists:   NOTE: Primary physician provider listed below. Patient may have been seen by APP or partner within same practice.   PROVIDER ROLE / SPECIALTY LAST Charlynn Grimes, MD Pulmonary Medicine  (Surgeon)  05/01/2020  Chase Picket, MD Primary Care Provider  12/29/2019  Dorian Furnace, NP-C Cardiology  02/28/2020   Allergies:  Rivaroxaban  Current Home Medications:   No current facility-administered medications for this encounter.   Marland Kitchen acetaminophen (TYLENOL) 500 MG tablet  . ammonium lactate (AMLACTIN) 12 % cream  . apixaban (ELIQUIS) 2.5 MG TABS tablet  . azithromycin (ZITHROMAX) 250 MG tablet  . BREO ELLIPTA 200-25 MCG/INH AEPB  . calcitRIOL (ROCALTROL) 0.5 MCG capsule  . DALIRESP 250 MCG TABS  . escitalopram (LEXAPRO) 20 MG tablet  . fenofibrate 160 MG tablet  . ferrous gluconate (FERGON) 324 MG tablet  . INCRUSE ELLIPTA 62.5 MCG/INH AEPB  . levothyroxine (SYNTHROID) 75 MCG tablet  . megestrol (MEGACE ES) 625 MG/5ML suspension  . montelukast (SINGULAIR) 10 MG tablet  . potassium chloride (KLOR-CON) 10 MEQ tablet  . simvastatin (ZOCOR) 40 MG tablet  . tobramycin, PF, (TOBI) 300 MG/5ML nebulizer solution  . triamcinolone ointment (KENALOG) 0.1 %  . vitamin B-12 (CYANOCOBALAMIN) 1000 MCG tablet   History:   Past Medical History:  Diagnosis Date  . Afib ([place])   . Anxiety   . Atrial fibrillation ([place]) 2017  . Cervical stenosis of spine   . COPD (chronic obstructive pulmonary disease) ([place])   . Depression   . Hyperlipidemia   . Hypothyroidism   . Nodule of right lung   . On supplemental oxygen by nasal cannula   . 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 Rupture of extensor tendon of finger   . Spondylosis  of cervical region without myelopathy or radiculopathy   . Stroke ([place])   . Vertigo   . Wartenberg syndrome    Past Surgical History:  Procedure Laterality Date  . ABDOMINAL HYSTERECTOMY    . APPENDECTOMY    . BREAST BIOPSY Left 1971  . BREAST CYST EXCISION Left 1971  . CATARACT SURGERY Bilateral   . LOOP RECORDER IMPLANT    . OOPHORECTOMY Bilateral    No family history on file. Social History   Tobacco Use  . Smoking status: Former Research scientist (life sciences)  . Smokeless tobacco: Never Used  Vaping Use  . Vaping Use: Never used  Substance Use Topics  . Alcohol use: Never  . Drug use: Never    Pertinent Clinical Results:  LABS: Labs reviewed: Acceptable for surgery. and Labs reviewed: Repeat     HYPOKALEMIA noted. Notice sent to surgeon. Order placed for DOS recheck.   Hospital Outpatient Visit on 05/28/2020  Component Date Value Ref Range Status  . Sodium 05/28/2020 139  135 - 145 mmol/L Final  . Potassium 05/28/2020 2.9* 3.5 - 5.1 mmol/L Final  . Chloride 05/28/2020 100  98 - 111 mmol/L Final  . CO2 05/28/2020 30  22 - 32 mmol/L Final  . Glucose, Bld 05/28/2020 112* 70 - 99 mg/dL Final   Glucose reference range applies only to samples taken after fasting for at least  8 hours.  . BUN 05/28/2020 25* 8 - 23 mg/dL Final  . Creatinine, Ser 05/28/2020 0.70  0.44 - 1.00 mg/dL Final  . Calcium 05/28/2020 10.3  8.9 - 10.3 mg/dL Final  . GFR, Estimated 05/28/2020 >60  >60 mL/min Final   Comment: (NOTE) Calculated using the CKD-EPI Creatinine Equation (2021)   . Anion gap 05/28/2020 9  5 - 15 Final   Performed at [facility], [address] [place], [place] [address]  . WBC 05/28/2020 9.5  4.0 - 10.5 K/uL Final  . RBC 05/28/2020 3.40* 3.87 - 5.11 MIL/uL Final  . Hemoglobin 05/28/2020 9.9* 12.0 - 15.0 g/dL Final  . HCT 05/28/2020 31.5* 36.0 - 46.0 % Final  . MCV 05/28/2020 92.6  80.0 - 100.0 fL Final  . MCH 05/28/2020 29.1  26.0 - 34.0 pg Final  . MCHC 05/28/2020 31.4  30.0 -  36.0 g/dL Final  . RDW 05/28/2020 14.7  11.5 - 15.5 % Final  . Platelets 05/28/2020 414* 150 - 400 K/uL Final  . nRBC 05/28/2020 0.0  0.0 - 0.2 % Final   Performed at [facility], [address] [place], [place] [address]  . aPTT 05/28/2020 35  24 - 36 seconds Final   Performed at [facility], [address] [place], [place] [address]  . Prothrombin Time 05/28/2020 12.4  11.4 - 15.2 seconds Final  . INR 05/28/2020 1.0  0.8 - 1.2 Final   Comment: (NOTE) INR goal varies based on device and disease states. Performed at [facility], [address] [place], [place] [address]     ECG: Date: 05/28/2020 Time ECG obtained: 1021 AM Rate: 66 bpm Rhythm: normal sinus Axis (leads I and aVF): Right axis deviation Intervals: PR 152 ms. QRS 86 ms. QTc 400 ms. ST segment and T wave changes: Nonspecific ST and T wave abnormality Comparison: Similar to previous tracing obtained on 12/16/2019   IMAGING / PROCEDURES: CT ANGIO CHEST PE W OR WO CONTRAST performed on 05/03/2020 1. No evidence of pulmonary embolus. 2. Bulky mediastinal and left hilar adenopathy. Mildly enlarged right hilar lymph nodes. This has progressed since prior study. 3. Postobstructive process in the left lower lobe, atelectasis versus infiltrate due to the enlarged left hilar lymph nodes. 4. Small left pleural effusion. 5. Continued areas of bronchiectasis and peribronchovascular nodularity, stable since prior study. 6. Cardiomegaly, small pericardial effusion, increasing since prior study. 7. Aortic atherosclerosis  8. Emphysema   NM PET SCAN INITIAL SKULL BASE TO THIGH performed on 12/26/2019 1. Intensely hypermetabolic enlarged  subcarinal and left hilar lymph nodes. Malignancy to be excluded. Leading differential considerations include small cell lung cancer or lymphoma.  2.7 cm subcarinal lymph node with max SUV 13.3  Enlarged left hilar lymph nodes with max SUV 11.2. 2. 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  3. No hypermetabolic metastatic disease in the neck, abdomen, pelvis, or skeleton. 4. Aortic atherosclerosis  PULMONARY FUNCTION TESTING performed on 10/04/2019 1. Severe restrictive ventilatory defect with decreased DLCO  SPIROMETRY: FVC was 0.75 liters, 31% of predicted FEV1 was 0.64, 35% of predicted FEV1 ratio was 86 FEF 25-75% liters per second was 37% of predicted *Patient tends to Hold Breath instead of continuing to Exhale. All efforts are consistent.   LUNG VOLUMES: TLC was 59% of predicted RV was 92% of predicted  DIFFUSION CAPACITY: DLCO was 34% of predicted DLCO/VA was 59% of predicted   FLOW VOLUME LOOP: Restricted appearing FVL 2. Compared to previous study, spirometry has declined and DLCO has remained consistent. 3. Patient weight down 45 pounds from previous study.   ECHOCARDIOGRAM performed on 10/11/2017 1. Left ventricle: The cavity size was normal. Wall thickness was normal. Systolic function was normal. The estimated ejection fraction was in the range of 55% to 60%. Wall motion was normal; there were no regional wall motion abnormalities. Doppler parameters are consistent with abnormal left ventricular relaxation (grade 1 diastolic dysfunction).  2. Aortic valve: Poorly visualized. There was no stenosis.  3. Mitral valve: There was no significant regurgitation.  4. Left atrium: The atrium was mildly dilated. 5. Right ventricle: The cavity size was normal. Systolic function was normal.  6. Pulmonary arteries: No complete TR doppler jet so unable to estimate PA systolic pressure. 7. Systemic veins: IVC measured 2.0 cm with < 50% respirophasic variation, suggesting RA pressure 8 mmHg.   Impression and Plan:   REKITA MIOTKE has been referred for pre-anesthesia review and clearance prior to her undergoing the planned anesthetic and procedural courses. Available labs, pertinent testing, and imaging results were personally reviewed by me. This patient has been appropriately cleared by cardiology with an overall LOW risk of operative cardiovascular complications.   Based on clinical review performed today (05/28/20), barring any significant acute changes in the patient's overall condition, it is anticipated that she will be able to proceed with the planned surgical intervention. Any acute changes in clinical condition may necessitate her procedure being postponed and/or cancelled. Pre-surgical instructions were reviewed with the patient during her PAT appointment and questions were fielded by PAT clinical staff.  Honor Loh, MSN, APRN, FNP-C, CEN [facility]  Peri-operative Services Nurse Practitioner Phone: [phone] 05/28/20 3:35 PM  NOTE: This note has been prepared using Dragon dictation software. Despite my best ability to proofread, there is always the potential that unintentional transcriptional errors may still occur from this process.

## 2020-05-28 ENCOUNTER — Encounter
Admission: RE | Admit: 2020-05-28 | Discharge: 2020-05-28 | Disposition: A | Discharge status: Home / Self Care | Payer: Medicare Other | Source: Ambulatory Visit | Attending: Pulmonary Disease | Admitting: Pulmonary Disease

## 2020-05-28 ENCOUNTER — Other Ambulatory Visit: Payer: Self-pay

## 2020-05-28 DIAGNOSIS — Z01812 Encounter for preprocedural laboratory examination: Secondary | ICD-10-CM | POA: Diagnosis present

## 2020-05-28 DIAGNOSIS — I4891 Unspecified atrial fibrillation: Secondary | ICD-10-CM | POA: Diagnosis not present

## 2020-05-28 DIAGNOSIS — Z20822 Contact with and (suspected) exposure to covid-19: Secondary | ICD-10-CM | POA: Diagnosis not present

## 2020-05-28 LAB — CBC
HCT: 31.5 % — ABNORMAL LOW (ref 36.0–46.0)
Hemoglobin: 9.9 g/dL — ABNORMAL LOW (ref 12.0–15.0)
MCH: 29.1 pg (ref 26.0–34.0)
MCHC: 31.4 g/dL (ref 30.0–36.0)
MCV: 92.6 fL (ref 80.0–100.0)
Platelets: 414 10*3/uL — ABNORMAL HIGH (ref 150–400)
RBC: 3.4 MIL/uL — ABNORMAL LOW (ref 3.87–5.11)
RDW: 14.7 % (ref 11.5–15.5)
WBC: 9.5 10*3/uL (ref 4.0–10.5)
nRBC: 0 % (ref 0.0–0.2)

## 2020-05-28 LAB — BASIC METABOLIC PANEL
Anion gap: 9 (ref 5–15)
BUN: 25 mg/dL — ABNORMAL HIGH (ref 8–23)
CO2: 30 mmol/L (ref 22–32)
Calcium: 10.3 mg/dL (ref 8.9–10.3)
Chloride: 100 mmol/L (ref 98–111)
Creatinine, Ser: 0.7 mg/dL (ref 0.44–1.00)
GFR, Estimated: >60 mL/min (ref >60)
Glucose, Bld: 112 mg/dL — ABNORMAL HIGH (ref 70–99)
Potassium: 2.9 mmol/L — ABNORMAL LOW (ref 3.5–5.1)
Sodium: 139 mmol/L (ref 135–145)

## 2020-05-28 LAB — PROTIME-INR
INR: 1 (ref 0.8–1.2)
Prothrombin Time: 12.4 seconds (ref 11.4–15.2)

## 2020-05-28 LAB — APTT: aPTT: 35 seconds (ref 24–36)

## 2020-05-28 NOTE — Progress Notes (Signed)
  [facility] Perioperative Services: Pre-Admission/Anesthesia Testing  Abnormal Lab Notification   Date: 05/28/20  Name: Christine Morris MRN:   [id number]  Re: Abnormal labs noted during PAT appointment   Provider(s) Notified: Ottie Glazier, MD Notification mode: Routed and/or faxed via [place] LAB VALUE(S): Lab Results  Component Value Date   K 2.9 (L) 05/28/2020    Notes:  Patient is scheduled for a VIDEO BRONCHOSCOPY WITH ENDOBRONCHIAL NAVIGATION (N/A ) VIDEO BRONCHOSCOPY WITH ENDOBRONCHIAL ULTRASOUND (N/A ) on 05/30/2020. Patient is NOT on any type of daily diuretic therapy. She is already taking K-Dur 10 mEq daily. Will forward to attending surgeon for review and optimization. Order placed to have K+ rechecked on the day of surgery to ensure correction of noted derangement. This is a Community education officer; no formal response is required.  Honor Loh, MSN, APRN, FNP-C, CEN [facility]  Peri-operative Services Nurse Practitioner Phone: [phone] Fax: [phone] 05/28/20 1:40 PM

## 2020-05-29 ENCOUNTER — Other Ambulatory Visit: Payer: Self-pay | Admitting: Pulmonary Disease

## 2020-05-29 DIAGNOSIS — J479 Bronchiectasis, uncomplicated: Secondary | ICD-10-CM

## 2020-05-29 LAB — SARS CORONAVIRUS 2 (TAT 6-24 HRS): SARS Coronavirus 2: NEGATIVE

## 2020-05-30 ENCOUNTER — Encounter
Admission: RE | Disposition: A | Discharge status: Home / Self Care | Payer: Self-pay | Source: Ambulatory Visit | Attending: Pulmonary Disease

## 2020-05-30 ENCOUNTER — Other Ambulatory Visit: Payer: Self-pay

## 2020-05-30 ENCOUNTER — Ambulatory Visit
Admission: RE | Admit: 2020-05-30 | Discharge: 2020-05-30 | Disposition: A | Discharge status: Home / Self Care | Payer: Medicare Other | Source: Ambulatory Visit | Attending: Pulmonary Disease | Admitting: Pulmonary Disease

## 2020-05-30 ENCOUNTER — Ambulatory Visit: Payer: Medicare Other | Admitting: Urgent Care

## 2020-05-30 DIAGNOSIS — I4891 Unspecified atrial fibrillation: Secondary | ICD-10-CM | POA: Insufficient documentation

## 2020-05-30 DIAGNOSIS — Z87891 Personal history of nicotine dependence: Secondary | ICD-10-CM | POA: Insufficient documentation

## 2020-05-30 DIAGNOSIS — C3432 Malignant neoplasm of lower lobe, left bronchus or lung: Secondary | ICD-10-CM | POA: Insufficient documentation

## 2020-05-30 DIAGNOSIS — J479 Bronchiectasis, uncomplicated: Secondary | ICD-10-CM | POA: Insufficient documentation

## 2020-05-30 DIAGNOSIS — C771 Secondary and unspecified malignant neoplasm of intrathoracic lymph nodes: Secondary | ICD-10-CM | POA: Insufficient documentation

## 2020-05-30 DIAGNOSIS — J449 Chronic obstructive pulmonary disease, unspecified: Secondary | ICD-10-CM | POA: Insufficient documentation

## 2020-05-30 DIAGNOSIS — Z9981 Dependence on supplemental oxygen: Secondary | ICD-10-CM | POA: Insufficient documentation

## 2020-05-30 DIAGNOSIS — Z888 Allergy status to other drugs, medicaments and biological substances status: Secondary | ICD-10-CM | POA: Insufficient documentation

## 2020-05-30 DIAGNOSIS — Z8673 Personal history of transient ischemic attack (TIA), and cerebral infarction without residual deficits: Secondary | ICD-10-CM | POA: Insufficient documentation

## 2020-05-30 HISTORY — DX: Other specified health status: Z78.9

## 2020-05-30 HISTORY — PX: VIDEO BRONCHOSCOPY WITH ENDOBRONCHIAL NAVIGATION: SHX6175

## 2020-05-30 HISTORY — PX: VIDEO BRONCHOSCOPY WITH ENDOBRONCHIAL ULTRASOUND: SHX6177

## 2020-05-30 LAB — POCT I-STAT, CHEM 8
BUN: 23 mg/dL (ref 8–23)
Calcium, Ion: 1.45 mmol/L — ABNORMAL HIGH (ref 1.15–1.40)
Chloride: 101 mmol/L (ref 98–111)
Creatinine, Ser: 0.9 mg/dL (ref 0.44–1.00)
Glucose, Bld: 92 mg/dL (ref 70–99)
HCT: 28 % — ABNORMAL LOW (ref 36.0–46.0)
Hemoglobin: 9.5 g/dL — ABNORMAL LOW (ref 12.0–15.0)
Potassium: 3.1 mmol/L — ABNORMAL LOW (ref 3.5–5.1)
Sodium: 141 mmol/L (ref 135–145)
TCO2: 29 mmol/L (ref 22–32)

## 2020-05-30 LAB — KOH PREP: KOH Prep: NONE SEEN

## 2020-05-30 SURGERY — VIDEO BRONCHOSCOPY WITH ENDOBRONCHIAL NAVIGATION
Anesthesia: General

## 2020-05-30 MED ORDER — SUGAMMADEX SODIUM 200 MG/2ML IV SOLN
INTRAVENOUS | Status: DC | PRN
  Administered 2020-05-30: 200 mg via INTRAVENOUS

## 2020-05-30 MED ORDER — ORAL CARE MOUTH RINSE: 15.0000 mL | Freq: Once | OROMUCOSAL | Status: AC

## 2020-05-30 MED ORDER — KETAMINE HCL 50 MG/5ML IJ SOSY
PREFILLED_SYRINGE | INTRAMUSCULAR | Status: AC
  Filled 2020-05-30: qty 5

## 2020-05-30 MED ORDER — LIDOCAINE HCL (CARDIAC) PF 100 MG/5ML IV SOSY
PREFILLED_SYRINGE | INTRAVENOUS | Status: DC | PRN
  Administered 2020-05-30: 50 mg via INTRAVENOUS

## 2020-05-30 MED ORDER — KETAMINE HCL 10 MG/ML IJ SOLN
INTRAMUSCULAR | Status: DC | PRN
  Administered 2020-05-30: 10 mg via INTRAVENOUS
  Administered 2020-05-30: 20 mg via INTRAVENOUS

## 2020-05-30 MED ORDER — IPRATROPIUM-ALBUTEROL 0.5-2.5 (3) MG/3ML IN SOLN: 3.0000 mL | Freq: Once | RESPIRATORY_TRACT | Status: DC | PRN

## 2020-05-30 MED ORDER — FENTANYL CITRATE (PF) 100 MCG/2ML IJ SOLN
INTRAMUSCULAR | Status: DC | PRN
  Administered 2020-05-30: 50 ug via INTRAVENOUS

## 2020-05-30 MED ORDER — CHLORHEXIDINE GLUCONATE 0.12 % MT SOLN
15.0000 mL | Freq: Once | OROMUCOSAL | Status: AC
  Administered 2020-05-30: 15 mL via OROMUCOSAL

## 2020-05-30 MED ORDER — ONDANSETRON HCL 4 MG/2ML IJ SOLN
INTRAMUSCULAR | Status: AC
  Filled 2020-05-30: qty 2

## 2020-05-30 MED ORDER — FENTANYL CITRATE (PF) 100 MCG/2ML IJ SOLN
INTRAMUSCULAR | Status: AC
  Filled 2020-05-30: qty 2

## 2020-05-30 MED ORDER — FAMOTIDINE 20 MG PO TABS: 20.0000 mg | ORAL_TABLET | Freq: Once | ORAL | Status: DC

## 2020-05-30 MED ORDER — CHLORHEXIDINE GLUCONATE 0.12 % MT SOLN
OROMUCOSAL | Status: AC
  Filled 2020-05-30: qty 15

## 2020-05-30 MED ORDER — ONDANSETRON HCL 4 MG/2ML IJ SOLN
INTRAMUSCULAR | Status: DC | PRN
  Administered 2020-05-30: 4 mg via INTRAVENOUS

## 2020-05-30 MED ORDER — LACTATED RINGERS IV SOLN: INTRAVENOUS | Status: DC

## 2020-05-30 MED ORDER — PROPOFOL 10 MG/ML IV BOLUS
INTRAVENOUS | Status: AC
  Filled 2020-05-30: qty 20

## 2020-05-30 MED ORDER — ROCURONIUM BROMIDE 100 MG/10ML IV SOLN
INTRAVENOUS | Status: DC | PRN
  Administered 2020-05-30: 50 mg via INTRAVENOUS

## 2020-05-30 MED ORDER — DEXAMETHASONE SODIUM PHOSPHATE 10 MG/ML IJ SOLN
INTRAMUSCULAR | Status: DC | PRN
  Administered 2020-05-30: 5 mg via INTRAVENOUS

## 2020-05-30 MED ORDER — SEVOFLURANE IN SOLN
RESPIRATORY_TRACT | Status: AC
  Filled 2020-05-30: qty 250

## 2020-05-30 MED ORDER — LIDOCAINE HCL URETHRAL/MUCOSAL 2 % EX GEL
1.0000 "application " | Freq: Once | CUTANEOUS | Status: DC
  Filled 2020-05-30: qty 5

## 2020-05-30 MED ORDER — BUTAMBEN-TETRACAINE-BENZOCAINE 2-2-14 % EX AERO
1.0000 | INHALATION_SPRAY | Freq: Once | CUTANEOUS | Status: DC
  Filled 2020-05-30: qty 20

## 2020-05-30 MED ORDER — ROCURONIUM BROMIDE 10 MG/ML (PF) SYRINGE
PREFILLED_SYRINGE | INTRAVENOUS | Status: AC
  Filled 2020-05-30: qty 10

## 2020-05-30 MED ORDER — PHENYLEPHRINE HCL 0.25 % NA SOLN
1.0000 | Freq: Four times a day (QID) | NASAL | Status: DC | PRN
  Filled 2020-05-30: qty 15

## 2020-05-30 MED ORDER — METOPROLOL TARTRATE 5 MG/5ML IV SOLN
INTRAVENOUS | Status: AC
  Filled 2020-05-30: qty 5

## 2020-05-30 MED ORDER — DEXAMETHASONE SODIUM PHOSPHATE 10 MG/ML IJ SOLN
INTRAMUSCULAR | Status: AC
  Filled 2020-05-30: qty 1

## 2020-05-30 MED ORDER — METOPROLOL TARTRATE 5 MG/5ML IV SOLN
INTRAVENOUS | Status: DC | PRN
  Administered 2020-05-30: .5 mg via INTRAVENOUS

## 2020-05-30 MED ORDER — PROPOFOL 10 MG/ML IV BOLUS
INTRAVENOUS | Status: DC | PRN
  Administered 2020-05-30: 20 mg via INTRAVENOUS
  Administered 2020-05-30: 80 mg via INTRAVENOUS

## 2020-05-30 MED ORDER — LIDOCAINE HCL (PF) 1 % IJ SOLN
30.0000 mL | Freq: Once | INTRAMUSCULAR | Status: DC
  Filled 2020-05-30: qty 30

## 2020-05-30 MED ORDER — LIDOCAINE HCL (PF) 2 % IJ SOLN
INTRAMUSCULAR | Status: AC
  Filled 2020-05-30: qty 5

## 2020-05-30 NOTE — Anesthesia Procedure Notes (Signed)
Procedure Name: Intubation Date/Time: 05/30/2020 12:52 PM Performed by: Lia Foyer, CRNA Pre-anesthesia Checklist: Patient identified, Emergency Drugs available, Suction available and Patient being monitored Patient Re-evaluated:Patient Re-evaluated prior to induction Oxygen Delivery Method: Circle system utilized Preoxygenation: Pre-oxygenation with 100% oxygen Induction Type: IV induction Ventilation: Mask ventilation without difficulty Laryngoscope Size: McGraph and 3 Grade View: Grade I Tube type: Oral Tube size: 8.5 mm Number of attempts: 1 Airway Equipment and Method: Stylet,  Oral airway and Video-laryngoscopy Placement Confirmation: ETT inserted through vocal cords under direct vision,  positive ETCO2 and breath sounds checked- equal and bilateral Secured at: 19 cm Tube secured with: Tape Dental Injury: Teeth and Oropharynx as per pre-operative assessment

## 2020-05-30 NOTE — Discharge Instructions (Signed)
AMBULATORY SURGERY  DISCHARGE INSTRUCTIONS   1) The drugs that you were given will stay in your system until tomorrow so for the next 24 hours you should not:  A) Drive an automobile B) Make any legal decisions C) Drink any alcoholic beverage   2) You may resume regular meals tomorrow.  Today it is better to start with liquids and gradually work up to solid foods.  You may eat anything you prefer, but it is better to start with liquids, then soup and crackers, and gradually work up to solid foods.  3) Please notify your doctor immediately if you have any unusual bleeding, trouble breathing, redness and pain at the surgery site, drainage, fever, or pain not relieved by medication.   4) Additional Instructions: Restart meds in AM.  Follow up in clinic tomorrow. Husband has appt. Date/time  Please contact your physician with any problems or Same Day Surgery at [phone], Monday through Friday 6 am to 4 pm, or [place] [facility] number at [phone].

## 2020-05-30 NOTE — Procedures (Signed)
ENDOBRONCHIAL ULTRASOUND PROCEDURE NOTE  FLEXIBLE BRONCHOSCOPY WITH THERAPEUTIC ASPIRATION OF TRACHEOBRONCHIAL TREE AND BRONCHOALVEOLAR LAVAGE  Flexible bronchoscopy was performed on 05/30/20 by : Lanney Gins   assistance by : 1)Larry Sloate RT  and 2)Anesthesia team   Indication for the procedure was :  Pre-procedural H&P. The following assessment was performed on the day of the procedure prior to initiating sedation History:  Chest pain N Dyspnea y Hemoptysis n Cough y Fever n Other pertinent items y  Examination Vital signs -reviewed as per nursing documentation today Cardiac    Murmurs: n  Rubs : n  Gallop: n Lungs Wheezing: n Rales : y Rhonchi {:y  Other pertinent findings: y   Pre-procedural assessment for Procedural Sedation included: Depth of sedation: As per anesthesia team  ASA Classification:  3 Mallampati airway assessment: 3    Medication list reviewed: y  The patient's interval history was taken and revealed: no new complaints The pre- procedure physical examination revealed: No new findings Refer to prior clinic note for details.  Informed Consent: Informed consent was obtained from:  patient after explanation of procedure and risks, benefits, as well as alternative procedures available.  Explanation of level of sedation and possible transfusion was also provided.    Procedural Preparation: Time out was performed and patient was identified by name and birthdate and procedure to be performed and side for sampling, if any, was specified. Pt was intubated by anesthesia.  The patient was appropriately draped.  Procedure Findings: Bronchoscope was inserted via ETT  without difficulty.  Posterior oropharynx, epiglottis, arytenoids, false cords and vocal cords were not visualized as these were bypassed by endotracheal tube.   The distal trachea was normal in circumference and appearance with mucosal, cartilaginous or branching abnormalities.  The main  carina was splayed .   All right and left lobar airways were visualized to the Sub-segmental level.  Sub- sub segmental carinae were identified in all the distal airways.   Secretions were visible in the following airways and appeared to be mucopurulent .  The mucosa was : friable and edematous worse at LLL  Airways were notable for:        exophytic lesions :At junction of Left mainstem and LLL       extrinsic compression in the following distributions: of LLL entrance point .       Friable mucosa: yes at [facility]       Anthrocotic material /pigmentation: n    Tenacious thickened phlegm was evacuated from bilateral airways with pictorial documentation illustrating multisegment mucus plugging.  Left lower lobe exophytic mass with Cytobrush specimens x3.  There was oozing of mucosa with some bleeding so I did not perform surgical forceps biopsy.  The blood oozing self resolved without use of epinephrine.   Bronchoalveolar lavage performed at left lower lobe sent for cytology.  Bronchial brushings with mucopurulent material removed and sent for microbiology  [facility] Cytotec reporting atypical lesional malignant cells on cytology via Cytobrush of left lower lobe  Pictorial documentation attached: y    [place]    05/30/2020 12:58  Attached To:  Hospital Encounter on 05/30/20   Source Information  Ottie Glazier, MD  [facility]    Media Information         Document Information  Photos    05/30/2020 13:02  Attached To:  Hospital Encounter on 05/30/20   Source Information  Ottie Glazier, MD  [facility]     --------------------------------------------------------------------  The fiberoptic bronchoscope was removed and the EBUS scope was introduced. Examination began to evaluate for pathologically enlarged lymph nodes starting on the Right side progressing to the Left.   All lymph node biopsies performed with  21g needle. Lymph node biopsies were sent in cytolite for all stations.  Initially station 10 R was evaluated via ultrasound measurement at 1.6 cm with 21-gauge needle biopsy x3 with atypia found via Cytotec report.  Station 7 was measured next at 2.4 cm with 21-gauge needle biopsy x3 with atypical lesional cells found Station 10 L was measured at 2.8 cm and was biopsied 4 times.  All stations were sent in formalin for pathology evaluations.  Additional specimen sent for RPMI to evaluate for leukemia/lymphoma  Atypical lesional malignant cells reported in all lymph node stations.    Specimens obtained included:  Bronchial washings site: bilateral  sent for: microbiology                                                   Cytology brushes : Left lower lobe  Broncho-alveolar lavage site: Left lower lobe sent for microbiology and cytology                             60 and ml volume infused 30 mL ml volume returned with serosanguineous and cellular appearance  Fluoroscopy Used: 0 minutes;        Pictorial documentation attached: Included   Immediate sampling complications included: None immediate Epinephrine 0 ml was used topically  The bronchoscopy was terminated due to completion of the planned procedure and the bronchoscope was removed.   Total dosage of Lidocaine was 0 mg Total fluoroscopy time was 0 minutes  Supplemental oxygen was provided at anesthesia team   Estimated Blood loss: 10 cc.  Complications included: None immediate   Disposition: I met with husband in the waiting room and reviewed procedure with additional follow-up scheduled on outpatient basis.  Follow up with Dr. Lanney Gins in 5 days for result discussion.   Claudette Stapler MD  [facility] Division of Pulmonary & Critical Care Medicine

## 2020-05-30 NOTE — Transfer of Care (Signed)
Immediate Anesthesia Transfer of Care Note  Patient: Christine Morris  Procedure(s) Performed: VIDEO BRONCHOSCOPY WITH ENDOBRONCHIAL NAVIGATION (N/A ) VIDEO BRONCHOSCOPY WITH ENDOBRONCHIAL ULTRASOUND (N/A )  Patient Location: PACU  Anesthesia Type:General  Level of Consciousness: drowsy  Airway & Oxygen Therapy: Patient Spontanous Breathing and Patient connected to face mask oxygen  Post-op Assessment: Report given to RN and Post -op Vital signs reviewed and stable  Post vital signs: Reviewed and stable  Last Vitals:  Vitals Value Taken Time  BP 141/60 05/30/20 1409  Temp    Pulse 74 05/30/20 1409  Resp 30 05/30/20 1409  SpO2 100 % 05/30/20 1409    Last Pain:  Vitals:   05/30/20 1409  TempSrc:   PainSc: 0-No pain         Complications: No complications documented.

## 2020-05-30 NOTE — H&P (Signed)
Pulmonary Medicine          Date: 05/30/2020,   MRN# [id number] Christine Morris January 18, 1941     AdmissionWeight: 49 kg                 CurrentWeight: 49 kg      CHIEF COMPLAINT:   Mediastinal mass with LLL infiltrate   HISTORY OF PRESENT ILLNESS   Pleasant 80 year old female who was previously seen for worsening dyspnea and advanced COPD with chronic hypoxemia. Patient was last seen in June 2020. She is currently using oxygen 24 hours a day at 3 to 4 L/min. Previously she was on Incruse Ellipta as well as [employer], we had initiated DuoNeb nebulizer therapy as well as Roflumilast 250 mcg daily. Patient states she had initiated this therapy and feels slightly improved.   She continues to make thick tenacious phlegm however reports no problems expectorating it. Today we had discussed bronchopulmonary hygiene techniques including incentive spirometer to combat atelectasis and patient will have one ordered.   She also has bronchiectasis which is COPD related and remains high risk for pneumonia and hospitalization as in January 2020. We had also discussed using low-dose Zithromax thrice weekly for chronic suppressive therapy and patient is agreeable to this plan. I had encouraged patient to participate in graduated exercise program/pulmonary rehab. She denies constitutional symptoms.  08/02/19- O2 concentrator stopped working and she was able to notify [employer], they were kind enough to replace it with a new device on Monday.   12/12/19- patient states she had episodes of epistaxis, ironically she reports using flonase and this somehow has helped her.  Patient had CT chest 10/27/19 we reviewed this together, there is findings suggestive of atypical granulomatous infection such as MAC as well as med/hilar lymphadenopathy suggestive of possible neoplasm, we will obtian PET as recommended.   She stopped using her Inentive spirometer but we have encouraged her to use it more and she  is doing that.   02/14/20- patient was hospitalized for respiratory distress and syncope, she was thought to have possible arrythmia and was monitored with telemetry but subsequently improved and d/cd. She had findings of low BP. She reports staying well hydrated drinking water several times daily and also drinking pepsi. She has good appetite and is eating as well as using ensure shakes to supplement for nourishment but despite all this she has lost 3 more lbs. She hat PET scan we discussed in detail regardign RLL nodule and hilar lymphadenopathy, patient states she feels she will not be able to undergo biopsy or therapy for lung ca and wishes to wait for now and think about it with husband. In the interim we discussed modalities to remove phlegm such as addition of mucomyst to nebulizer regimen. She is to continue TOBI for bronchiectasis  Patient is here today for biopsy of mediastinal mass and airway inspection of lLL infiltrate with post obstructive process.    PAST MEDICAL HISTORY   Past Medical History:  Diagnosis Date  . Afib ([place])   . Anxiety   . Atrial fibrillation ([place]) 2017  . Cervical stenosis of spine   . COPD (chronic obstructive pulmonary disease) ([place])   . Depression   . Hyperlipidemia   . Hypothyroidism   . Nodule of right lung   . On supplemental oxygen by nasal cannula   . 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 Rupture of extensor tendon of finger   . Spondylosis of cervical  region without myelopathy or radiculopathy   . Stroke ([place])   . Vertigo   . Wartenberg syndrome      SURGICAL HISTORY   Past Surgical History:  Procedure Laterality Date  . ABDOMINAL HYSTERECTOMY    . APPENDECTOMY    . BREAST BIOPSY Left 1971  . BREAST CYST EXCISION Left 1971  . CATARACT SURGERY Bilateral   . LOOP RECORDER IMPLANT    . OOPHORECTOMY Bilateral      FAMILY HISTORY   History reviewed. No pertinent family history.   SOCIAL HISTORY   Social History   Tobacco Use   . Smoking status: Former Research scientist (life sciences)  . Smokeless tobacco: Never Used  Vaping Use  . Vaping Use: Never used  Substance Use Topics  . Alcohol use: Never  . Drug use: Never     MEDICATIONS    Home Medication:    Current Medication:  Current Facility-Administered Medications:  .  butamben-tetracaine-benzocaine (CETACAINE) spray 1 spray, 1 spray, Topical, Once, Zyionna Pesce, MD .  chlorhexidine (PERIDEX) 0.12 % solution 15 mL, 15 mL, Mouth/Throat, Once **OR** MEDLINE mouth rinse, 15 mL, Mouth Rinse, Once, Molli Barrows, MD .  famotidine (PEPCID) tablet 20 mg, 20 mg, Oral, Once, Karen Kitchens, NP .  lactated ringers infusion, , Intravenous, Continuous, Molli Barrows, MD, Last Rate: 10 mL/hr at 05/30/20 1150, New Bag at 05/30/20 1150 .  lidocaine (PF) (XYLOCAINE) 1 % injection 30 mL, 30 mL, Other, Once, Kindal Ponti, MD .  lidocaine (XYLOCAINE) 2 % jelly 1 application, 1 application, Topical, Once, Saadiya Wilfong, MD .  phenylephrine (NEO-SYNEPHRINE) 0.25 % nasal spray 1 spray, 1 spray, Each Nare, Q6H PRN, Ottie Glazier, MD    ALLERGIES   Rivaroxaban     REVIEW OF SYSTEMS    Review of Systems:  Gen:  Denies  fever, sweats, chills weigh loss  HEENT: Denies blurred vision, double vision, ear pain, eye pain, hearing loss, nose bleeds, sore throat Cardiac:  No dizziness, chest pain or heaviness, chest tightness,edema Resp:   Denies cough or sputum porduction, shortness of breath,wheezing, hemoptysis,  Gi: Denies swallowing difficulty, stomach pain, nausea or vomiting, diarrhea, constipation, bowel incontinence Gu:  Denies bladder incontinence, burning urine Ext:   Denies Joint pain, stiffness or swelling Skin: Denies  skin rash, easy bruising or bleeding or hives Endoc:  Denies polyuria, polydipsia , polyphagia or weight change Psych:   Denies depression, insomnia or hallucinations   Other:  All other systems negative   VS: BP (!) 176/73   Pulse 87   Temp 98.5 F  (36.9 C) (Temporal)   Resp 16   Ht _0  (1.6 m)   Wt 49 kg   SpO2 94%   BMI 19.14 kg/m      PHYSICAL EXAM    GENERAL:NAD, no fevers, chills, no weakness no fatigue HEAD: Normocephalic, atraumatic.  EYES: Pupils equal, round, reactive to light. Extraocular muscles intact. No scleral icterus.  MOUTH: Moist mucosal membrane. Dentition intact. No abscess noted.  EAR, NOSE, THROAT: Clear without exudates. No external lesions.  NECK: Supple. No thyromegaly. No nodules. No JVD.  PULMONARY: Decreased air entrly bilaterally CARDIOVASCULAR: S1 and S2. Regular rate and rhythm. No murmurs, rubs, or gallops. No edema. Pedal pulses 2+ bilaterally.  GASTROINTESTINAL: Soft, nontender, nondistended. No masses. Positive bowel sounds. No hepatosplenomegaly.  MUSCULOSKELETAL: No swelling, clubbing, or edema. Range of motion full in all extremities.  NEUROLOGIC: Cranial nerves II through XII are intact. No gross focal neurological deficits. Sensation  intact. Reflexes intact.  SKIN: No ulceration, lesions, rashes, or cyanosis. Skin warm and dry. Turgor intact.  PSYCHIATRIC: Mood, affect within normal limits. The patient is awake, alert and oriented x 3. Insight, judgment intact.       IMAGING    CT CHEST WO CONTRAST  Result Date: 05/30/2020 CLINICAL DATA:  Shortness of breath.  Preop for bronchoscopy. EXAM: CT CHEST WITHOUT CONTRAST TECHNIQUE: Multidetector CT imaging of the chest was performed following the standard protocol without IV contrast. COMPARISON:  May 03, 2020. FINDINGS: Cardiovascular: Atherosclerosis of thoracic aorta is noted without aneurysm formation. Normal cardiac size. Small pericardial effusion is noted. Coronary artery calcifications are noted. Mediastinum/Nodes: As noted on prior exam, there is extensive mediastinal adenopathy present particularly in the right paratracheal pretracheal subcarinal and left hilar regions. This results in narrowing of the left mainstem bronchus.  Thyroid gland is unremarkable. Esophagus is unremarkable. Lungs/Pleura: No pneumothorax is noted. Mild biapical scarring is noted. Mild to moderate left pleural effusion is noted with adjacent left lower lobe postobstructive atelectasis or infiltrate. Upper Abdomen: No acute abnormality. Musculoskeletal: No chest wall mass or suspicious bone lesions identified. IMPRESSION: 1. Mild to moderate left pleural effusion is noted with adjacent left lower lobe postobstructive atelectasis or infiltrate. 2. Small pericardial effusion is noted. 3. Coronary artery calcifications are noted suggesting coronary artery disease. 4. As noted on prior exam, there is extensive mediastinal adenopathy present particularly in the right paratracheal, subcarinal and left hilar regions. This results in narrowing of the left mainstem bronchus. This is concerning for malignancy or metastatic disease. Aortic Atherosclerosis (ICD10-I70.0). Electronically Signed   By: Marijo Conception M.D.   On: 05/30/2020 09:32   CT ANGIO CHEST PE W OR WO CONTRAST  Result Date: 05/03/2020 CLINICAL DATA:  COPD, shortness of breath EXAM: CT ANGIOGRAPHY CHEST WITH CONTRAST TECHNIQUE: Multidetector CT imaging of the chest was performed using the standard protocol during bolus administration of intravenous contrast. Multiplanar CT image reconstructions and MIPs were obtained to evaluate the vascular anatomy. CONTRAST:  54m OMNIPAQUE IOHEXOL 350 MG/ML SOLN COMPARISON:  10/26/2019 FINDINGS: Cardiovascular: Heavily calcified aorta. Scattered coronary artery calcifications. Mild cardiomegaly. Small pericardial effusion, increased in size since prior study. No evidence of aortic aneurysm. No evidence of pulmonary embolus. Mediastinum/Nodes: Worsening subcarinal adenopathy. Conglomerate subcarinal mass has a short axis diameter of 4.2 cm compared with 2.5 cm previously. Worsening left hilar adenopathy. Short axis diameter 2 cm compared with 1.8 cm previously. Right  inferior hilar adenopathy also increased with a short axis diameter of 2.6 cm compared with 1.7 cm previously. Mild right hilar adenopathy with index node having a short axis diameter of 12 mm, new since prior study. Right paratracheal lymph node has a short axis diameter of 2.3 cm compared with 7 mm previously. Lungs/Pleura: Small left pleural effusion. Left lower lobe airspace opacity may be related to postobstructive process from left hilar adenopathy and could reflect atelectasis or infiltrate/pneumonia. Bronchiectasis and mucous plugging within the lower lobes bilaterally. Extensive peribronchovascular micro and macro nodularity, also stable. Spiculated nodule in the right upper lobe measures 10 mm compared with 6 mm previously. Scarring in the apices. Mild emphysema. Upper Abdomen: Imaging into the upper abdomen demonstrates no acute findings. Musculoskeletal: Chest wall soft tissues are unremarkable. No acute bony abnormality. Review of the MIP images confirms the above findings. IMPRESSION: No evidence of pulmonary embolus. Bulky mediastinal and left hilar adenopathy. Mildly enlarged right hilar lymph nodes. This has progressed since prior study. Postobstructive process in  the left lower lobe, atelectasis versus infiltrate due to the enlarged left hilar lymph nodes. Small left pleural effusion. Continued areas of bronchiectasis and peribronchovascular nodularity, stable since prior study. Cardiomegaly, small pericardial effusion, increasing since prior study. Aortic Atherosclerosis (ICD10-I70.0) and Emphysema (ICD10-J43.9). Electronically Signed   By: Rolm Baptise M.D.   On: 05/03/2020 13:56      ASSESSMENT/PLAN      Left lower lobe infiltraet with mediastial mass and paratracheal /mediastinal lymphadenopathy    - patient presents for EBUS and flexible bronchoscopy of lesion   -Reviewed risks/complications and benefits with patient, risks include infection, pneumothorax/pneumomediastinum which  may require chest tube placement as well as overnight/prolonged hospitalization and possible mechanical ventilation. Other risks include bleeding and very rarely death.  Patient understands risks and wishes to proceed.  Additional questions were answered, and patient is aware that post procedure patient will be going home with family and may experience cough with possible clots on expectoration as well as phlegm which may last few days as well as hoarseness of voice post intubation and mechanical ventilation.     Thank you for allowing me to participate in the care of this patient.    Patient/Family are satisfied with care plan and all questions have been answered.  This document was prepared using Dragon voice recognition software and may include unintentional dictation errors.     Ottie Glazier, M.D.  Division of [facility]

## 2020-05-30 NOTE — Anesthesia Preprocedure Evaluation (Signed)
Anesthesia Evaluation  Patient identified by MRN, date of birth, ID band Patient awake    Reviewed: Allergy & Precautions, H&P , NPO status , Patient's Chart, lab work & pertinent test results  History of Anesthesia Complications Negative for: history of anesthetic complications  Airway Mallampati: III  TM Distance: >3 FB Neck ROM: limited    Dental  (+) Edentulous Lower, Edentulous Upper   Pulmonary COPD,  oxygen dependent, former smoker,    Pulmonary exam normal        Cardiovascular Exercise Tolerance: Good (-) Past MI negative cardio ROS Normal cardiovascular exam     Neuro/Psych PSYCHIATRIC DISORDERS TIA Neuromuscular disease CVA    GI/Hepatic negative GI ROS, Neg liver ROS, neg GERD  ,  Endo/Other  Hypothyroidism   Renal/GU      Musculoskeletal  (+) Arthritis ,   Abdominal   Peds  Hematology negative hematology ROS (+)   Anesthesia Other Findings Past Medical History: No date: Afib ([place]) No date: Anxiety 2017: Atrial fibrillation ([facility]) No date: Cervical stenosis of spine No date: COPD (chronic obstructive pulmonary disease) ([facility]) No date: Depression No date: Hyperlipidemia No date: Hypothyroidism No date: Nodule of right lung No date: On supplemental oxygen by nasal cannula No date: Osteonecrosis ([place])     Comment:  OF BOTH HIPS No date: Rupture of extensor tendon of finger No date: Spondylosis of cervical region without myelopathy or  radiculopathy No date: Stroke [facility]) No date: Vertigo No date: Wartenberg syndrome  Past Surgical History: No date: ABDOMINAL HYSTERECTOMY No date: APPENDECTOMY 1971: BREAST BIOPSY; Left 1971: BREAST CYST EXCISION; Left No date: CATARACT SURGERY; Bilateral No date: LOOP RECORDER IMPLANT No date: OOPHORECTOMY; Bilateral  BMI    Body Mass Index: 19.14 kg/m      Reproductive/Obstetrics negative OB ROS                              Anesthesia Physical Anesthesia Plan  ASA: IV  Anesthesia Plan: General ETT   Post-op Pain Management:    Induction: Intravenous  PONV Risk Score and Plan: Ondansetron, Dexamethasone, Midazolam and Treatment may vary due to age or medical condition  Airway Management Planned: Oral ETT  Additional Equipment:   Intra-op Plan:   Post-operative Plan: Extubation in OR and Possible Post-op intubation/ventilation  Informed Consent: I have reviewed the patients History and Physical, chart, labs and discussed the procedure including the risks, benefits and alternatives for the proposed anesthesia with the patient or authorized representative who has indicated his/her understanding and acceptance.     Dental Advisory Given  Plan Discussed with: Anesthesiologist, CRNA and Surgeon  Anesthesia Plan Comments: (Patient consented for risks of anesthesia including but not limited to:  - adverse reactions to medications - damage to eyes, teeth, lips or other oral mucosa - nerve damage due to positioning  - sore throat or hoarseness - Damage to heart, brain, nerves, lungs, other parts of body or loss of life  Patient voiced understanding.)        Anesthesia Quick Evaluation

## 2020-05-31 ENCOUNTER — Encounter: Payer: Self-pay | Admitting: Pulmonary Disease

## 2020-05-31 NOTE — Anesthesia Postprocedure Evaluation (Signed)
Anesthesia Post Note  Patient: MADDEN PIAZZA  Procedure(s) Performed: VIDEO BRONCHOSCOPY WITH ENDOBRONCHIAL NAVIGATION (N/A ) VIDEO BRONCHOSCOPY WITH ENDOBRONCHIAL ULTRASOUND (N/A )  Patient location during evaluation: PACU Anesthesia Type: General Level of consciousness: awake and alert Pain management: pain level controlled Vital Signs Assessment: post-procedure vital signs reviewed and stable Respiratory status: spontaneous breathing, nonlabored ventilation, respiratory function stable and patient connected to nasal cannula oxygen Cardiovascular status: blood pressure returned to baseline and stable Postop Assessment: no apparent nausea or vomiting Anesthetic complications: no   No complications documented.   Last Vitals:  Vitals:   05/30/20 1520 05/30/20 1540  BP: 139/70 (!) 127/53  Pulse: 85 75  Resp: (!) 22 (!) 22  Temp:    SpO2: 99% 100%    Last Pain:  Vitals:   05/30/20 1540  TempSrc:   PainSc: 0-No pain                 Precious Haws Mancel Lardizabal

## 2020-06-01 ENCOUNTER — Other Ambulatory Visit: Payer: Self-pay | Admitting: Oncology

## 2020-06-01 ENCOUNTER — Encounter: Payer: Self-pay | Admitting: *Deleted

## 2020-06-01 LAB — ACID FAST SMEAR (AFB, MYCOBACTERIA): Acid Fast Smear: NEGATIVE

## 2020-06-01 LAB — CYTOLOGY - NON PAP

## 2020-06-01 NOTE — Progress Notes (Signed)
  Oncology Nurse Navigator Documentation  Navigator Location: [facility] (06/01/20 1600) Referral Date to RadOnc/MedOnc: 06/01/20 (06/01/20 1600) )Navigator Encounter Type: Telephone (06/01/20 1600) Telephone: Appt Confirmation/Clarification (06/01/20 1600) Abnormal Finding Date: 10/27/19 (06/01/20 1600) Confirmed Diagnosis Date: 06/01/20 (06/01/20 1600)                   Barriers/Navigation Needs: Coordination of Care (06/01/20 1600)   Interventions: Coordination of Care (06/01/20 1600)   Coordination of Care: Appts (06/01/20 1600)           phone call made to patient to inform of upcoming new patient appt with Dr. Janese Banks to discuss biopsy results. Pt made aware of upcoming appt. All questions answered during call. Instructed to call back with any further questions or needs prior to appt. Pt verbalized understanding and confirmed appt.        Time Spent with Patient: 30 (06/01/20 1600)

## 2020-06-02 ENCOUNTER — Inpatient Hospital Stay
Admission: EM | Admit: 2020-06-02 | Discharge: 2020-06-12 | DRG: 871 | Disposition: A | Discharge status: Home / Self Care | Payer: Medicare Other | Attending: Family Medicine | Admitting: Family Medicine

## 2020-06-02 ENCOUNTER — Other Ambulatory Visit: Payer: Self-pay

## 2020-06-02 ENCOUNTER — Emergency Department: Payer: Medicare Other

## 2020-06-02 DIAGNOSIS — E876 Hypokalemia: Secondary | ICD-10-CM | POA: Diagnosis present

## 2020-06-02 DIAGNOSIS — E039 Hypothyroidism, unspecified: Secondary | ICD-10-CM | POA: Diagnosis present

## 2020-06-02 DIAGNOSIS — Z87891 Personal history of nicotine dependence: Secondary | ICD-10-CM | POA: Diagnosis not present

## 2020-06-02 DIAGNOSIS — R636 Underweight: Secondary | ICD-10-CM | POA: Diagnosis present

## 2020-06-02 DIAGNOSIS — A4152 Sepsis due to Pseudomonas: Secondary | ICD-10-CM | POA: Diagnosis present

## 2020-06-02 DIAGNOSIS — J9611 Chronic respiratory failure with hypoxia: Secondary | ICD-10-CM | POA: Diagnosis present

## 2020-06-02 DIAGNOSIS — R197 Diarrhea, unspecified: Secondary | ICD-10-CM | POA: Diagnosis present

## 2020-06-02 DIAGNOSIS — Z23 Encounter for immunization: Secondary | ICD-10-CM

## 2020-06-02 DIAGNOSIS — Z20822 Contact with and (suspected) exposure to covid-19: Secondary | ICD-10-CM | POA: Diagnosis present

## 2020-06-02 DIAGNOSIS — Z681 Body mass index (BMI) 19 or less, adult: Secondary | ICD-10-CM

## 2020-06-02 DIAGNOSIS — C3490 Malignant neoplasm of unspecified part of unspecified bronchus or lung: Secondary | ICD-10-CM | POA: Diagnosis present

## 2020-06-02 DIAGNOSIS — J441 Chronic obstructive pulmonary disease with (acute) exacerbation: Principal | ICD-10-CM

## 2020-06-02 DIAGNOSIS — R590 Localized enlarged lymph nodes: Secondary | ICD-10-CM | POA: Diagnosis present

## 2020-06-02 DIAGNOSIS — Z888 Allergy status to other drugs, medicaments and biological substances status: Secondary | ICD-10-CM

## 2020-06-02 DIAGNOSIS — F419 Anxiety disorder, unspecified: Secondary | ICD-10-CM | POA: Diagnosis present

## 2020-06-02 DIAGNOSIS — J151 Pneumonia due to Pseudomonas: Secondary | ICD-10-CM | POA: Diagnosis present

## 2020-06-02 DIAGNOSIS — E785 Hyperlipidemia, unspecified: Secondary | ICD-10-CM | POA: Diagnosis present

## 2020-06-02 DIAGNOSIS — Z79899 Other long term (current) drug therapy: Secondary | ICD-10-CM

## 2020-06-02 DIAGNOSIS — J9621 Acute and chronic respiratory failure with hypoxia: Secondary | ICD-10-CM | POA: Diagnosis present

## 2020-06-02 DIAGNOSIS — Z9049 Acquired absence of other specified parts of digestive tract: Secondary | ICD-10-CM

## 2020-06-02 DIAGNOSIS — M47812 Spondylosis without myelopathy or radiculopathy, cervical region: Secondary | ICD-10-CM | POA: Diagnosis present

## 2020-06-02 DIAGNOSIS — Z7901 Long term (current) use of anticoagulants: Secondary | ICD-10-CM

## 2020-06-02 DIAGNOSIS — Z90710 Acquired absence of both cervix and uterus: Secondary | ICD-10-CM

## 2020-06-02 DIAGNOSIS — Z8673 Personal history of transient ischemic attack (TIA), and cerebral infarction without residual deficits: Secondary | ICD-10-CM

## 2020-06-02 DIAGNOSIS — L8990 Pressure ulcer of unspecified site, unspecified stage: Secondary | ICD-10-CM | POA: Insufficient documentation

## 2020-06-02 DIAGNOSIS — M4802 Spinal stenosis, cervical region: Secondary | ICD-10-CM | POA: Diagnosis present

## 2020-06-02 DIAGNOSIS — J440 Chronic obstructive pulmonary disease with acute lower respiratory infection: Secondary | ICD-10-CM | POA: Diagnosis present

## 2020-06-02 DIAGNOSIS — M879 Osteonecrosis, unspecified: Secondary | ICD-10-CM | POA: Diagnosis present

## 2020-06-02 DIAGNOSIS — L89151 Pressure ulcer of sacral region, stage 1: Secondary | ICD-10-CM | POA: Clinically undetermined

## 2020-06-02 DIAGNOSIS — J449 Chronic obstructive pulmonary disease, unspecified: Secondary | ICD-10-CM | POA: Diagnosis present

## 2020-06-02 DIAGNOSIS — I639 Cerebral infarction, unspecified: Secondary | ICD-10-CM | POA: Diagnosis present

## 2020-06-02 DIAGNOSIS — R9389 Abnormal findings on diagnostic imaging of other specified body structures: Secondary | ICD-10-CM

## 2020-06-02 DIAGNOSIS — E872 Acidosis: Secondary | ICD-10-CM | POA: Diagnosis present

## 2020-06-02 DIAGNOSIS — I480 Paroxysmal atrial fibrillation: Secondary | ICD-10-CM | POA: Diagnosis present

## 2020-06-02 DIAGNOSIS — A419 Sepsis, unspecified organism: Secondary | ICD-10-CM

## 2020-06-02 DIAGNOSIS — F32A Depression, unspecified: Secondary | ICD-10-CM | POA: Diagnosis present

## 2020-06-02 DIAGNOSIS — J9622 Acute and chronic respiratory failure with hypercapnia: Secondary | ICD-10-CM | POA: Diagnosis present

## 2020-06-02 DIAGNOSIS — J209 Acute bronchitis, unspecified: Secondary | ICD-10-CM | POA: Diagnosis present

## 2020-06-02 DIAGNOSIS — J9620 Acute and chronic respiratory failure, unspecified whether with hypoxia or hypercapnia: Secondary | ICD-10-CM

## 2020-06-02 DIAGNOSIS — F329 Major depressive disorder, single episode, unspecified: Secondary | ICD-10-CM | POA: Diagnosis present

## 2020-06-02 DIAGNOSIS — C3411 Malignant neoplasm of upper lobe, right bronchus or lung: Secondary | ICD-10-CM | POA: Diagnosis not present

## 2020-06-02 DIAGNOSIS — Z9981 Dependence on supplemental oxygen: Secondary | ICD-10-CM

## 2020-06-02 DIAGNOSIS — I82409 Acute embolism and thrombosis of unspecified deep veins of unspecified lower extremity: Secondary | ICD-10-CM

## 2020-06-02 LAB — BLOOD GAS, ARTERIAL
Acid-Base Excess: 1.6 mmol/L (ref 0.0–2.0)
Bicarbonate: 26.6 mmol/L (ref 20.0–28.0)
Delivery systems: POSITIVE
Expiratory PAP: 6
FIO2: 0.4
Inspiratory PAP: 12
O2 Saturation: 98.7 %
Patient temperature: 37
RATE: 20 resp/min
pCO2 arterial: 43 mmHg (ref 32.0–48.0)
pH, Arterial: 7.4 (ref 7.350–7.450)
pO2, Arterial: 121 mmHg — ABNORMAL HIGH (ref 83.0–108.0)

## 2020-06-02 LAB — BASIC METABOLIC PANEL
Anion gap: 9 (ref 5–15)
Anion gap: 8 (ref 5–15)
BUN: 21 mg/dL (ref 8–23)
BUN: 18 mg/dL (ref 8–23)
CO2: 31 mmol/L (ref 22–32)
CO2: 25 mmol/L (ref 22–32)
Calcium: 9.6 mg/dL (ref 8.9–10.3)
Calcium: 9.2 mg/dL (ref 8.9–10.3)
Chloride: 102 mmol/L (ref 98–111)
Chloride: 109 mmol/L (ref 98–111)
Creatinine, Ser: 0.61 mg/dL (ref 0.44–1.00)
Creatinine, Ser: 0.6 mg/dL (ref 0.44–1.00)
GFR, Estimated: >60 mL/min (ref >60)
GFR, Estimated: >60 mL/min (ref >60)
Glucose, Bld: 127 mg/dL — ABNORMAL HIGH (ref 70–99)
Glucose, Bld: 159 mg/dL — ABNORMAL HIGH (ref 70–99)
Potassium: 2.7 mmol/L — CL (ref 3.5–5.1)
Potassium: 5.2 mmol/L — ABNORMAL HIGH (ref 3.5–5.1)
Sodium: 142 mmol/L (ref 135–145)
Sodium: 142 mmol/L (ref 135–145)

## 2020-06-02 LAB — BLOOD GAS, VENOUS
Acid-Base Excess: 5.2 mmol/L — ABNORMAL HIGH (ref 0.0–2.0)
Bicarbonate: 32.5 mmol/L — ABNORMAL HIGH (ref 20.0–28.0)
Delivery systems: POSITIVE
FIO2: 0.45
O2 Saturation: 27 %
PEEP: 6 cmH2O
Patient temperature: 37
Pressure support: 12 cmH2O
pCO2, Ven: 63 mmHg — ABNORMAL HIGH (ref 44.0–60.0)
pH, Ven: 7.32 (ref 7.250–7.430)
pO2, Ven: <31 mmHg — CL (ref 32.0–45.0)

## 2020-06-02 LAB — CBC WITH DIFFERENTIAL/PLATELET
Abs Immature Granulocytes: 0.07 10*3/uL (ref 0.00–0.07)
Basophils Absolute: 0.1 10*3/uL (ref 0.0–0.1)
Basophils Relative: 1 %
Eosinophils Absolute: 0.3 10*3/uL (ref 0.0–0.5)
Eosinophils Relative: 2 %
HCT: 30.2 % — ABNORMAL LOW (ref 36.0–46.0)
Hemoglobin: 8.9 g/dL — ABNORMAL LOW (ref 12.0–15.0)
Immature Granulocytes: 0 %
Lymphocytes Relative: 7 %
Lymphs Abs: 1.1 10*3/uL (ref 0.7–4.0)
MCH: 28.1 pg (ref 26.0–34.0)
MCHC: 29.5 g/dL — ABNORMAL LOW (ref 30.0–36.0)
MCV: 95.3 fL (ref 80.0–100.0)
Monocytes Absolute: 0.9 10*3/uL (ref 0.1–1.0)
Monocytes Relative: 6 %
Neutro Abs: 13.6 10*3/uL — ABNORMAL HIGH (ref 1.7–7.7)
Neutrophils Relative %: 84 %
Platelets: 441 10*3/uL — ABNORMAL HIGH (ref 150–400)
RBC: 3.17 MIL/uL — ABNORMAL LOW (ref 3.87–5.11)
RDW: 14.6 % (ref 11.5–15.5)
WBC: 16 10*3/uL — ABNORMAL HIGH (ref 4.0–10.5)
nRBC: 0 % (ref 0.0–0.2)

## 2020-06-02 LAB — ASPERGILLUS ANTIGEN, BAL/SERUM: Aspergillus Ag, BAL/Serum: 0.05 Index (ref 0.00–0.49)

## 2020-06-02 LAB — PROTIME-INR
INR: 1.2 (ref 0.8–1.2)
Prothrombin Time: 14.8 seconds (ref 11.4–15.2)

## 2020-06-02 LAB — LACTIC ACID, PLASMA: Lactic Acid, Venous: 1.9 mmol/L (ref 0.5–1.9)

## 2020-06-02 LAB — RESP PANEL BY RT-PCR (FLU A&B, COVID) ARPGX2
Influenza A by PCR: NEGATIVE
Influenza B by PCR: NEGATIVE
SARS Coronavirus 2 by RT PCR: NEGATIVE

## 2020-06-02 LAB — TROPONIN I (HIGH SENSITIVITY)
Troponin I (High Sensitivity): 15 ng/L (ref <18)
Troponin I (High Sensitivity): 12 ng/L (ref <18)

## 2020-06-02 LAB — PROCALCITONIN: Procalcitonin: <0.1 ng/mL

## 2020-06-02 LAB — BRAIN NATRIURETIC PEPTIDE: B Natriuretic Peptide: 129.7 pg/mL — ABNORMAL HIGH (ref 0.0–100.0)

## 2020-06-02 LAB — APTT: aPTT: 39 seconds — ABNORMAL HIGH (ref 24–36)

## 2020-06-02 LAB — MAGNESIUM: Magnesium: 1.6 mg/dL — ABNORMAL LOW (ref 1.7–2.4)

## 2020-06-02 MED ORDER — IPRATROPIUM-ALBUTEROL 0.5-2.5 (3) MG/3ML IN SOLN
9.0000 mL | Freq: Once | RESPIRATORY_TRACT | Status: AC
  Administered 2020-06-02: 9 mL via RESPIRATORY_TRACT
  Filled 2020-06-02: qty 3

## 2020-06-02 MED ORDER — PIPERACILLIN-TAZOBACTAM 3.375 G IVPB
3.3750 g | Freq: Three times a day (TID) | INTRAVENOUS | Status: DC
  Administered 2020-06-02 – 2020-06-04 (×7): 3.375 g via INTRAVENOUS
  Filled 2020-06-02 (×7): qty 50

## 2020-06-02 MED ORDER — FLUTICASONE FUROATE-VILANTEROL 200-25 MCG/INH IN AEPB
1.0000 | INHALATION_SPRAY | Freq: Every day | RESPIRATORY_TRACT | Status: DC
  Administered 2020-06-02 – 2020-06-12 (×6): 1 via RESPIRATORY_TRACT
  Filled 2020-06-02 (×2): qty 28

## 2020-06-02 MED ORDER — SODIUM CHLORIDE 0.9% FLUSH: 3.0000 mL | INTRAVENOUS | Status: DC | PRN

## 2020-06-02 MED ORDER — ROFLUMILAST 500 MCG PO TABS
250.0000 ug | ORAL_TABLET | Freq: Every day | ORAL | Status: DC
  Administered 2020-06-02 – 2020-06-07 (×5): 250 ug via ORAL
  Filled 2020-06-02 (×8): qty 1

## 2020-06-02 MED ORDER — UMECLIDINIUM BROMIDE 62.5 MCG/INH IN AEPB
1.0000 | INHALATION_SPRAY | Freq: Every day | RESPIRATORY_TRACT | Status: DC
  Administered 2020-06-04 – 2020-06-12 (×8): 1 via RESPIRATORY_TRACT
  Filled 2020-06-02 (×2): qty 7

## 2020-06-02 MED ORDER — LEVOTHYROXINE SODIUM 50 MCG PO TABS: 75.0000 ug | ORAL_TABLET | Freq: Every day | ORAL | Status: DC

## 2020-06-02 MED ORDER — LEVOTHYROXINE SODIUM 50 MCG PO TABS
75.0000 ug | ORAL_TABLET | Freq: Every day | ORAL | Status: DC
  Administered 2020-06-02: 75 ug via ORAL
  Filled 2020-06-02: qty 2

## 2020-06-02 MED ORDER — LEVOFLOXACIN IN D5W 750 MG/150ML IV SOLN
750.0000 mg | INTRAVENOUS | Status: DC
  Administered 2020-06-03: 750 mg via INTRAVENOUS
  Filled 2020-06-02: qty 150

## 2020-06-02 MED ORDER — POTASSIUM CHLORIDE CRYS ER 20 MEQ PO TBCR
80.0000 meq | EXTENDED_RELEASE_TABLET | Freq: Once | ORAL | Status: AC
  Administered 2020-06-02: 80 meq via ORAL
  Filled 2020-06-02: qty 4

## 2020-06-02 MED ORDER — ESCITALOPRAM OXALATE 10 MG PO TABS
20.0000 mg | ORAL_TABLET | Freq: Every day | ORAL | Status: DC
  Administered 2020-06-02 – 2020-06-12 (×10): 20 mg via ORAL
  Filled 2020-06-02 (×13): qty 2

## 2020-06-02 MED ORDER — SODIUM CHLORIDE 0.9% FLUSH
3.0000 mL | Freq: Two times a day (BID) | INTRAVENOUS | Status: DC
  Administered 2020-06-02 – 2020-06-12 (×20): 3 mL via INTRAVENOUS

## 2020-06-02 MED ORDER — MONTELUKAST SODIUM 10 MG PO TABS
10.0000 mg | ORAL_TABLET | ORAL | Status: DC
  Administered 2020-06-02 – 2020-06-04 (×2): 10 mg via ORAL
  Filled 2020-06-02 (×4): qty 1

## 2020-06-02 MED ORDER — ALBUTEROL SULFATE (2.5 MG/3ML) 0.083% IN NEBU: 2.5000 mg | INHALATION_SOLUTION | RESPIRATORY_TRACT | Status: DC | PRN

## 2020-06-02 MED ORDER — SIMVASTATIN 20 MG PO TABS
40.0000 mg | ORAL_TABLET | Freq: Every evening | ORAL | Status: DC
  Administered 2020-06-04 – 2020-06-10 (×7): 40 mg via ORAL
  Filled 2020-06-02 (×7): qty 2

## 2020-06-02 MED ORDER — IPRATROPIUM-ALBUTEROL 0.5-2.5 (3) MG/3ML IN SOLN
3.0000 mL | Freq: Four times a day (QID) | RESPIRATORY_TRACT | Status: DC
  Administered 2020-06-02 – 2020-06-04 (×10): 3 mL via RESPIRATORY_TRACT
  Filled 2020-06-02 (×11): qty 3

## 2020-06-02 MED ORDER — LORAZEPAM 2 MG/ML IJ SOLN: 0.5000 mg | Freq: Once | INTRAMUSCULAR | Status: AC

## 2020-06-02 MED ORDER — MAGNESIUM SULFATE 2 GM/50ML IV SOLN
2.0000 g | Freq: Once | INTRAVENOUS | Status: AC
  Administered 2020-06-02: 2 g via INTRAVENOUS
  Filled 2020-06-02: qty 50

## 2020-06-02 MED ORDER — LORAZEPAM 2 MG/ML IJ SOLN
INTRAMUSCULAR | Status: AC
  Administered 2020-06-02: 0.5 mg via INTRAVENOUS
  Filled 2020-06-02: qty 1

## 2020-06-02 MED ORDER — LEVOFLOXACIN IN D5W 750 MG/150ML IV SOLN: 750.0000 mg | INTRAVENOUS | Status: DC

## 2020-06-02 MED ORDER — APIXABAN 5 MG PO TABS
5.0000 mg | ORAL_TABLET | Freq: Two times a day (BID) | ORAL | Status: DC
  Administered 2020-06-02 – 2020-06-12 (×20): 5 mg via ORAL
  Filled 2020-06-02 (×20): qty 1

## 2020-06-02 MED ORDER — SODIUM CHLORIDE 0.9 % IV SOLN
1.0000 g | Freq: Once | INTRAVENOUS | Status: AC
  Administered 2020-06-02: 1 g via INTRAVENOUS
  Filled 2020-06-02: qty 10

## 2020-06-02 MED ORDER — LORAZEPAM 2 MG/ML IJ SOLN
1.0000 mg | Freq: Four times a day (QID) | INTRAMUSCULAR | Status: DC | PRN
  Administered 2020-06-02 – 2020-06-10 (×9): 1 mg via INTRAVENOUS
  Filled 2020-06-02 (×11): qty 1

## 2020-06-02 MED ORDER — LEVOTHYROXINE SODIUM 100 MCG/5ML IV SOLN
38.0000 ug | Freq: Every day | INTRAVENOUS | Status: DC
  Administered 2020-06-04 – 2020-06-08 (×5): 38 ug via INTRAVENOUS
  Filled 2020-06-02 (×6): qty 5

## 2020-06-02 MED ORDER — SODIUM CHLORIDE 0.9 % IV SOLN
250.0000 mL | INTRAVENOUS | Status: DC | PRN
  Administered 2020-06-02 – 2020-06-04 (×4): 250 mL via INTRAVENOUS

## 2020-06-02 MED ORDER — LACTATED RINGERS IV BOLUS
1000.0000 mL | Freq: Once | INTRAVENOUS | Status: AC
  Administered 2020-06-02: 1000 mL via INTRAVENOUS

## 2020-06-02 MED ORDER — POTASSIUM CHLORIDE IN NACL 40-0.9 MEQ/L-% IV SOLN
INTRAVENOUS | Status: DC
  Filled 2020-06-02 (×4): qty 1000

## 2020-06-02 MED ORDER — SODIUM CHLORIDE 0.9 % IV SOLN
500.0000 mg | Freq: Once | INTRAVENOUS | Status: AC
  Administered 2020-06-02: 500 mg via INTRAVENOUS
  Filled 2020-06-02: qty 500

## 2020-06-02 MED ORDER — POTASSIUM CHLORIDE 20 MEQ PO PACK
80.0000 meq | PACK | Freq: Once | ORAL | Status: AC
  Administered 2020-06-02: 80 meq via ORAL
  Filled 2020-06-02: qty 4

## 2020-06-02 MED ORDER — METHYLPREDNISOLONE SODIUM SUCC 125 MG IJ SOLR
60.0000 mg | Freq: Four times a day (QID) | INTRAMUSCULAR | Status: DC
  Administered 2020-06-02 – 2020-06-03 (×5): 60 mg via INTRAVENOUS
  Filled 2020-06-02 (×5): qty 2

## 2020-06-02 NOTE — ED Provider Notes (Signed)
[facility] Emergency Department Provider Note  ____________________________________________   Event Date/Time   First MD Initiated Contact with Patient 06/02/20 [phone]     (approximate)  I have reviewed the triage vital signs and the nursing notes.   HISTORY  Chief Complaint Shortness of Breath   HPI Christine Morris is a 80 y.o. female with a past medical history of A. fib, HDL, hypothyroidism, depression and chronic proximal respiratory failure on 5 L nasal cannula at baseline secondary to COPD, bronchiectasis, and known b/l small pleural effusions (s/p recent lung biopsy on  1/5) who presents via EMS from home for assessment of acute onset of shortness of breath associate with increase in cough from baseline.  Patient also endorses some diarrhea today.  She denies any chest pain, fevers, abdominal pain, back pain, headache, earache, sore throat, rash or other acute sick symptoms.          Past Medical History:  Diagnosis Date  . Afib ([place])   . Anxiety   . Atrial fibrillation ([employer]) 2017  . Cervical stenosis of spine   . COPD (chronic obstructive pulmonary disease) ([place])   . Depression   . Hyperlipidemia   . Hypothyroidism   . Nodule of right lung   . On supplemental oxygen by nasal cannula   . 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 Rupture of extensor tendon of finger   . Spondylosis of cervical region without myelopathy or radiculopathy   . Stroke ([place])   . Vertigo   . Wartenberg syndrome     Patient Active Problem List   Diagnosis Date Noted  . Syncope 12/16/2019  . AF (paroxysmal atrial fibrillation) ([place]) 12/16/2019  . COPD with chronic bronchitis ([place]) 12/16/2019  . Chronic respiratory failure with hypoxia ([place]) 12/16/2019  . Sepsis ([place]) 05/31/2018  . TIA (transient ischemic attack) 10/11/2017  . CVA (cerebral vascular accident) ([place]) 10/11/2017    Past Surgical History:  Procedure Laterality Date  . ABDOMINAL HYSTERECTOMY     . APPENDECTOMY    . BREAST BIOPSY Left 1971  . BREAST CYST EXCISION Left 1971  . CATARACT SURGERY Bilateral   . LOOP RECORDER IMPLANT    . OOPHORECTOMY Bilateral   . VIDEO BRONCHOSCOPY WITH ENDOBRONCHIAL NAVIGATION N/A 05/30/2020   Procedure: VIDEO BRONCHOSCOPY WITH ENDOBRONCHIAL NAVIGATION;  Surgeon: Ottie Glazier, MD;  Location: [facility] ORS;  Service: Thoracic;  Laterality: N/A;  . VIDEO BRONCHOSCOPY WITH ENDOBRONCHIAL ULTRASOUND N/A 05/30/2020   Procedure: VIDEO BRONCHOSCOPY WITH ENDOBRONCHIAL ULTRASOUND;  Surgeon: Ottie Glazier, MD;  Location: [facility] ORS;  Service: Thoracic;  Laterality: N/A;    Prior to Admission medications   Medication Sig Start Date End Date Taking? Authorizing Provider  acetaminophen (TYLENOL) 500 MG tablet Take 1,000 mg by mouth every 6 (six) hours as needed for pain.    [provider]  ammonium lactate (AMLACTIN) 12 % cream Apply 1 application topically 2 (two) times daily as needed for dry skin. Patient not taking: Reported on 05/30/2020 07/21/19   [provider]  apixaban (ELIQUIS) 2.5 MG TABS tablet Take 2.5 mg by mouth every 12 (twelve) hours. 04/30/18   [provider]  azithromycin (ZITHROMAX) 250 MG tablet Take 250 mg by mouth every Monday, Wednesday, and Friday.  12/05/19   [provider]  BREO ELLIPTA 200-25 MCG/INH AEPB Inhale 1 puff into the lungs daily.    [provider]  calcitRIOL (ROCALTROL) 0.5 MCG capsule Take 0.5 mcg by mouth daily.  [provider]  DALIRESP 250 MCG TABS Take 250 mcg by mouth daily.  11/17/19   [provider]  escitalopram (LEXAPRO) 20 MG tablet Take 20 mg by mouth daily.    [provider]  fenofibrate 160 MG tablet Take 160 mg by mouth daily.    [provider]  ferrous gluconate (FERGON) 324 MG tablet Take 324 mg by mouth daily with breakfast.     [provider]  INCRUSE ELLIPTA 62.5 MCG/INH AEPB Inhale 1 puff into the lungs daily.     [provider]  levothyroxine (SYNTHROID) 75 MCG tablet Take 75 mcg by mouth daily before breakfast. 10/18/19   [provider]  megestrol (MEGACE ES) 625 MG/5ML suspension Take 5 mLs by mouth daily. 03/31/20   [provider]  montelukast (SINGULAIR) 10 MG tablet Take 10 mg by mouth every morning.    [provider]  potassium chloride (KLOR-CON) 10 MEQ tablet Take 10 mEq by mouth daily. 10/21/19   [provider]  simvastatin (ZOCOR) 40 MG tablet Take 40 mg by mouth every evening.     [provider]  tobramycin, PF, (TOBI) 300 MG/5ML nebulizer solution Take 300 mg by nebulization 2 (two) times daily. (use for 28 days then off for 28 days)    [provider]  triamcinolone ointment (KENALOG) 0.1 % Apply 1 application topically 2 (two) times daily as needed (dry skin). Patient not taking: Reported on 05/30/2020 10/17/19   [provider]  vitamin B-12 (CYANOCOBALAMIN) 1000 MCG tablet Take 1,000 mcg by mouth daily.    [provider]    Allergies Rivaroxaban  History reviewed. No pertinent family history.  Social History Social History   Tobacco Use  . Smoking status: Former Research scientist (life sciences)  . Smokeless tobacco: Never Used  Vaping Use  . Vaping Use: Never used  Substance Use Topics  . Alcohol use: Never  . Drug use: Never    Review of Systems  Review of Systems  Constitutional: Negative for chills and fever.  HENT: Negative for sore throat.   Eyes: Negative for pain.  Respiratory: Positive for cough and shortness of breath. Negative for stridor.   Cardiovascular: Negative for chest pain.  Gastrointestinal: Positive for diarrhea. Negative for vomiting.  Skin: Negative for rash.  Neurological: Negative for seizures, loss of consciousness and headaches.  Psychiatric/Behavioral: Negative for suicidal ideas.  All other systems reviewed and are negative.      ____________________________________________   PHYSICAL EXAM:  VITAL SIGNS: ED Triage Vitals  Enc Vitals Group     BP      Pulse      Resp      Temp      Temp src      SpO2      Weight      Height      Head Circumference      Peak Flow      Pain Score      Pain Loc      Pain Edu?      Excl. in [place]?    Vitals:   06/02/20 0620 06/02/20 0630  BP:  (!) 116/57  Pulse:  (!) 120  Resp:  18  Temp: 97.6 F (36.4 C)   SpO2:  99%   Physical Exam Vitals and nursing note reviewed.  Constitutional:      General: She is in acute distress.     Appearance: She is well-developed and well-nourished. She is ill-appearing.  HENT:  Head: Normocephalic and atraumatic.     Right Ear: External ear normal.     Left Ear: External ear normal.     Nose: Nose normal.     Mouth/Throat:     Mouth: Mucous membranes are dry.  Eyes:     Conjunctiva/sclera: Conjunctivae normal.  Cardiovascular:     Rate and Rhythm: Regular rhythm. Tachycardia present.     Heart sounds: No murmur heard.   Pulmonary:     Effort: Tachypnea, accessory muscle usage and respiratory distress present.     Breath sounds: Decreased breath sounds, wheezing and rhonchi present.  Abdominal:     Palpations: Abdomen is soft.     Tenderness: There is no abdominal tenderness.  Musculoskeletal:        General: No edema.     Cervical back: Neck supple.     Right lower leg: No edema.     Left lower leg: No edema.  Skin:    General: Skin is warm and dry.     Capillary Refill: Capillary refill takes 2 to 3 seconds.  Neurological:     Mental Status: She is alert and oriented to person, place, and time.  Psychiatric:        Mood and Affect: Mood and affect and mood normal.      ____________________________________________   LABS (all labs ordered are listed, but only abnormal results are displayed)  Labs Reviewed  CBC WITH DIFFERENTIAL/PLATELET - Abnormal; Notable for the following components:      Result  Value   WBC 16.0 (*)    RBC 3.17 (*)    Hemoglobin 8.9 (*)    HCT 30.2 (*)    MCHC 29.5 (*)    Platelets 441 (*)    Neutro Abs 13.6 (*)    All other components within normal limits  BASIC METABOLIC PANEL - Abnormal; Notable for the following components:   Potassium 2.7 (*)    Glucose, Bld 127 (*)    All other components within normal limits  BRAIN NATRIURETIC PEPTIDE - Abnormal; Notable for the following components:   B Natriuretic Peptide 129.7 (*)    All other components within normal limits  BLOOD GAS, VENOUS - Abnormal; Notable for the following components:   pCO2, Ven 63 (*)    pO2, Ven <31.0 (*)    Bicarbonate 32.5 (*)    Acid-Base Excess 5.2 (*)    All other components within normal limits  MAGNESIUM - Abnormal; Notable for the following components:   Magnesium 1.6 (*)    All other components within normal limits  RESP PANEL BY RT-PCR (FLU A&B, COVID) ARPGX2  CULTURE, BLOOD (ROUTINE X 2)  CULTURE, BLOOD (ROUTINE X 2)  URINE CULTURE  PROCALCITONIN  LACTIC ACID, PLASMA  URINALYSIS, COMPLETE (UACMP) WITH MICROSCOPIC  APTT  PROTIME-INR  TROPONIN I (HIGH SENSITIVITY)  TROPONIN I (HIGH SENSITIVITY)   ____________________________________________  EKG  sinus rhythm with a ventricular rate of 99, right axis deviation, unremarkable intervals, and some significant artifact versus nonspecific ST changes throughout. ____________________________________________  RADIOLOGY  ED MD interpretation: Consolidation in the left lung base with effusion noted.  Diffuse reticulonodular opacities throughout without pneumothorax or significant pulmonary edema.  Official radiology report(s): DG Chest 1 View  Result Date: 06/02/2020 CLINICAL DATA:  Shortness of breath, right lobe biopsy performed Wednesday EXAM: CHEST  1 VIEW COMPARISON:  Radiograph 12/16/2019, CT 05/30/2020 FINDINGS: There is persistent atelectasis and/or consolidation in the left lung base with a left pleural effusion  as  was seen on comparison CT imaging 3 days prior. Some additional bronchitic and bronchiectatic changes are seen diffusely throughout the lungs with some chronically coarsened interstitial changes and reticulonodular opacities throughout the lungs in a similar distribution to more remote comparison. No new consolidative opacity. No pneumothorax. No convincing CT features of edema at this time. The aorta is calcified. The remaining cardiomediastinal contours are unremarkable. No acute osseous or soft tissue abnormality. Degenerative changes are present in the imaged spine and shoulders. Telemetry leads and support devices overlie the chest. IMPRESSION: 1. Persistent atelectasis and/or consolidation in the left lung base with a left pleural effusion as was seen on comparison CT imaging. 2. Some chronic bronchitic/bronchiectatic changes with coarsened interstitium and diffuse reticulonodular opacities conspicuous for a long-standing/chronic atypical infection including mycobacterial etiologies. 3. No other acute cardiopulmonary abnormalities. 4. The aorta is calcified. The remaining cardiomediastinal contours are unremarkable. Electronically Signed   By: Lovena Le M.D.   On: 06/02/2020 05:39    ____________________________________________   PROCEDURES  Procedure(s) performed (including Critical Care):  .1-3 Lead EKG Interpretation Performed by: Lucrezia Starch, MD Authorized by: Lucrezia Starch, MD     Interpretation: abnormal     ECG rate assessment: tachycardic     Rhythm: sinus tachycardia     Ectopy: none     Conduction: normal   .Critical Care Performed by: Lucrezia Starch, MD Authorized by: Lucrezia Starch, MD   Critical care provider statement:    Critical care time (minutes):  45   Critical care time was exclusive of:  Separately billable procedures and treating other patients   Critical care was necessary to treat or prevent imminent or life-threatening deterioration of the  following conditions:  Sepsis and respiratory failure   Critical care was time spent personally by me on the following activities:  Discussions with consultants, evaluation of patient's response to treatment, examination of patient, ordering and performing treatments and interventions, ordering and review of laboratory studies, ordering and review of radiographic studies, pulse oximetry, re-evaluation of patient's condition, obtaining history from patient or surrogate and review of old charts     ____________________________________________   INITIAL IMPRESSION / [place] / ED COURSE      Patient presents with above-stated history and exam for assessment of acute onset of worsening shortness of breath from baseline as well as increase in sputum production and an episode of diarrhea.  Did receive 2 DuoNeb treatments and Solu-Medrol by EMS prior to arrival.  On arrival patient has significant work of breathing and bilateral wheezing as well as tachypnea at 24 with otherwise stable vital signs on 5 L.  However given work of breathing she was immediately transitioned to BiPAP.  CBC obtained shows WBC count of 16 with hemoglobin of 8.9 compared to 9.53 days ago and unremarkable platelets.  VBG remarkable for pH of 7.32 with a PCO2 of 62 and a bicarb of 32.5 representing very mild acute respiratory acidosis.  BMP remarkable for K of 2.7 otherwise no significant electrolyte or metabolic derangements.  BNP is 129.7 and overall a low suspicion for acute heart failure at this time.  Troponin is 15 and EKG has some nonspecific changes low suspicion for ACS or myocarditis.  Magnesium 1.6.  Lactic acid is less than 2 and Covid is negative  Shortly after going to BiPAP patient had rising heart rate and respiratory rate he was placed back on nasal cannula as she did not seem to tolerate this.  However she  continues to have no evidence of hypoxic respiratory failure.   Patient received Solu-Medrol with  EMS prior to arrival.  She was given duo nebs antibiotics and electrolytes as noted below.  In addition given elevated white blood cell count and other SIRS criteria patient does meet criteria for sepsis.  Blood cultures were obtained and patient was given IV fluid.   She will be admitted to medicine service for further evaluation management.  ____________________________________________   FINAL CLINICAL IMPRESSION(S) / ED DIAGNOSES  Final diagnoses:  COPD exacerbation ([place])  Acute on chronic respiratory failure with hypoxia and hypercapnia (HCC)  Hypokalemia  Hypomagnesemia  Sepsis, due to unspecified organism, unspecified whether acute organ dysfunction present ([place])    Medications  azithromycin (ZITHROMAX) 500 mg in sodium chloride 0.9 % 250 mL IVPB (500 mg Intravenous New Bag/Given 06/02/20 0551)  magnesium sulfate IVPB 2 g 50 mL (2 g Intravenous New Bag/Given 06/02/20 0619)  ipratropium-albuterol (DUONEB) 0.5-2.5 (3) MG/3ML nebulizer solution 9 mL (9 mLs Nebulization Given 06/02/20 0454)  cefTRIAXone (ROCEPHIN) 1 g in sodium chloride 0.9 % 100 mL IVPB (0 g Intravenous Stopped 06/02/20 0613)  potassium chloride SA (KLOR-CON) CR tablet 80 mEq (80 mEq Oral Given 06/02/20 0542)  lactated ringers bolus 1,000 mL (1,000 mLs Intravenous New Bag/Given 06/02/20 0614)  LORazepam (ATIVAN) injection 0.5 mg (0.5 mg Intravenous Given 06/02/20 0615)     ED Discharge Orders    None       Note:  This document was prepared using Dragon voice recognition software and may include unintentional dictation errors.   Lucrezia Starch, MD 06/02/20 [phone]

## 2020-06-02 NOTE — ED Notes (Signed)
RT called per MD Tamala Julian request to place pt on BiPAP.

## 2020-06-02 NOTE — ED Notes (Signed)
Date and time results received: 06/02/20 5:34 AM  Test: Potassium Critical Value: 2.7  Name of Provider Notified: Tamala Julian MD

## 2020-06-02 NOTE — ED Notes (Signed)
Pt placed on BiPAP by respiratory.

## 2020-06-02 NOTE — Progress Notes (Signed)
Pharmacy Antibiotic Note  FLOREEN TEEGARDEN is a 80 y.o. female admitted on 06/02/2020 with pneumonia.  Pharmacy has been consulted for Zosyn dosing. Per H&P, bronchoscopy w/ lavage 05/30/20, brushings showing metastatic scc, culture showing pseudomonas.  Plan: Zosyn 3.375g IV q8h (4 hour infusion).  Height: 5\' 3"  (160 cm) Weight: 44.9 kg (99 lb) IBW/kg (Calculated) : 52.4  Temp (24hrs), Avg:97.6 F (36.4 C), Min:97.6 F (36.4 C), Max:97.6 F (36.4 C)  Recent Labs  Lab 05/28/20 1050 05/30/20 1153 06/02/20 0451  WBC 9.5  --  16.0*  CREATININE 0.70 0.90 0.61  LATICACIDVEN  --   --  1.9    Estimated Creatinine Clearance: 41.1 mL/min (by C-G formula based on SCr of 0.61 mg/dL).    Allergies  Allergen Reactions  . Rivaroxaban Hives    Antimicrobials this admission: Zosyn 1/8 >>    Dose adjustments this admission:   Microbiology results: 1/8 BCx: collected 1/8 UCx: ordered    Thank you for allowing pharmacy to be a part of this patient's care.  Rocky Morel 06/02/2020 10:05 AM

## 2020-06-02 NOTE — ED Notes (Signed)
Pt noted to have respirations of 40 per minute with heart rate of 145. MD Tamala Julian made aware and responded to bedside.

## 2020-06-02 NOTE — ED Notes (Signed)
Pt hitting call bell, taking off BiPAP, stating she can no longer tolerate BiPAP. Pt placed on 5L nasal cannula. MD Tamala Julian made aware and advised okay to leave patient on 5L nasal cannula.

## 2020-06-02 NOTE — H&P (Addendum)
History and Physical    Christine Morris DOB: 1941/03/31 DOA: 06/02/2020  PCP: Chase Picket, MD  Patient coming from: home   Chief Complaint: shortness of breath  HPI: Christine Morris is a 80 y.o. female with medical history significant for severe copd, home o2 4 L, paroxysmal a fib on doac, hypothyroid, and recent dx of lung cancer, who presents with above.  Followed by [facility]. Bronchoscopy w/ lavage 05/30/20, brushings showing metastatic scc, culture showing pseudomonas. Has onc f/u scheduled later this month.  Presents for 2 days worsening shortness of breath and productive cough, requiring more home o2. Denies fever or sick contacts. No chest pain, has had some loose stool but no diarrhea, no vomiting. No dysuria. No lower extremity swelling or pnd. Says compliant w/ home meds.  ED Course:   satting normally on 5 L but with respiratory distress, placed on bipap but did not tolerate, on my exam back on 5 L. Given duonebs and ceftriaxone/azithromycin. For K of 2.7 given mg and 80 of kcl oral. Given 2 L of fluids.  Review of Systems: As per HPI otherwise 10 point review of systems negative.    Past Medical History:  Diagnosis Date  . Afib ([place])   . Anxiety   . Atrial fibrillation ([place]) 2017  . Cervical stenosis of spine   . COPD (chronic obstructive pulmonary disease) ([address])   . Depression   . Hyperlipidemia   . Hypothyroidism   . Nodule of right lung   . On supplemental oxygen by nasal cannula   . 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 Rupture of extensor tendon of finger   . Spondylosis of cervical region without myelopathy or radiculopathy   . Stroke ([place])   . Vertigo   . Wartenberg syndrome     Past Surgical History:  Procedure Laterality Date  . ABDOMINAL HYSTERECTOMY    . APPENDECTOMY    . BREAST BIOPSY Left 1971  . BREAST CYST EXCISION Left 1971  . CATARACT SURGERY Bilateral   . LOOP RECORDER IMPLANT    . OOPHORECTOMY Bilateral    . VIDEO BRONCHOSCOPY WITH ENDOBRONCHIAL NAVIGATION N/A 05/30/2020   Procedure: VIDEO BRONCHOSCOPY WITH ENDOBRONCHIAL NAVIGATION;  Surgeon: Ottie Glazier, MD;  Location: [facility] ORS;  Service: Thoracic;  Laterality: N/A;  . VIDEO BRONCHOSCOPY WITH ENDOBRONCHIAL ULTRASOUND N/A 05/30/2020   Procedure: VIDEO BRONCHOSCOPY WITH ENDOBRONCHIAL ULTRASOUND;  Surgeon: Ottie Glazier, MD;  Location: [facility] ORS;  Service: Thoracic;  Laterality: N/A;     reports that she has quit smoking. She has never used smokeless tobacco. She reports that she does not drink alcohol and does not use drugs.  Allergies  Allergen Reactions  . Rivaroxaban Hives    History reviewed. No pertinent family history.  Prior to Admission medications   Medication Sig Start Date End Date Taking? Authorizing Provider  acetaminophen (TYLENOL) 500 MG tablet Take 1,000 mg by mouth every 6 (six) hours as needed for pain.    [provider]  ammonium lactate (AMLACTIN) 12 % cream Apply 1 application topically 2 (two) times daily as needed for dry skin. Patient not taking: Reported on 05/30/2020 07/21/19   [provider]  apixaban (ELIQUIS) 2.5 MG TABS tablet Take 2.5 mg by mouth every 12 (twelve) hours. 04/30/18   [provider]  azithromycin (ZITHROMAX) 250 MG tablet Take 250 mg by mouth every Monday, Wednesday, and Friday.  12/05/19   [provider]  [place] Patter 419-37 MCG/INH AEPB  Inhale 1 puff into the lungs daily.    [provider]  calcitRIOL (ROCALTROL) 0.5 MCG capsule Take 0.5 mcg by mouth daily.    [provider]  DALIRESP 250 MCG TABS Take 250 mcg by mouth daily.  11/17/19   [provider]  escitalopram (LEXAPRO) 20 MG tablet Take 20 mg by mouth daily.    [provider]  fenofibrate 160 MG tablet Take 160 mg by mouth daily.    [provider]  ferrous gluconate (FERGON) 324 MG tablet Take 324 mg by mouth daily with breakfast.     [provider]  INCRUSE ELLIPTA 62.5 MCG/INH AEPB Inhale 1 puff into the lungs daily.    [provider]  levothyroxine (SYNTHROID) 75 MCG tablet Take 75 mcg by mouth daily before breakfast. 10/18/19   [provider]  megestrol (MEGACE ES) 625 MG/5ML suspension Take 5 mLs by mouth daily. 03/31/20   [provider]  montelukast (SINGULAIR) 10 MG tablet Take 10 mg by mouth every morning.    [provider]  potassium chloride (KLOR-CON) 10 MEQ tablet Take 10 mEq by mouth daily. 10/21/19   [provider]  simvastatin (ZOCOR) 40 MG tablet Take 40 mg by mouth every evening.     [provider]  tobramycin, PF, (TOBI) 300 MG/5ML nebulizer solution Take 300 mg by nebulization 2 (two) times daily. (use for 28 days then off for 28 days)    [provider]  triamcinolone ointment (KENALOG) 0.1 % Apply 1 application topically 2 (two) times daily as needed (dry skin). Patient not taking: Reported on 05/30/2020 10/17/19   [provider]  vitamin B-12 (CYANOCOBALAMIN) 1000 MCG tablet Take 1,000 mcg by mouth daily.    [provider]    Physical Exam: Vitals:   06/02/20 0630 06/02/20 0645 06/02/20 0700 06/02/20 0801  BP: (!) 116/57  (!) 101/45 (!) 112/53  Pulse: (!) 120  (!) 102 (!) 101  Resp: 18 (!) 30 (!) 27 (!) 26  Temp:      TempSrc:      SpO2: 99%  98% 98%  Weight:      Height:        Constitutional: chronically ill appearing, tachypnic Head: Atraumatic Eyes: Conjunctiva clear ENM: dry mucous membranes. poordentition.  Neck: Supple Respiratory: rhonchi and wheeze throughout Cardiovascular: Regular rate and rhythm. Soft systolic murmur Abdomen: Non-tender, non-distended. No masses. No rebound or guarding. Positive bowel sounds. Musculoskeletal: No joint deformity upper and lower extremities. Normal ROM, no contractures. Normal muscle tone.  Skin: No rashes, lesions, or ulcers.  Extremities: No peripheral edema.  Palpable peripheral pulses. Neurologic: Alert, moving all 4 extremities. Psychiatric: Normal insight and judgement.   Labs on Admission: I have personally reviewed following labs and imaging studies  CBC: Recent Labs  Lab 05/28/20 1050 05/30/20 1153 06/02/20 0451  WBC 9.5  --  16.0*  NEUTROABS  --   --  13.6*  HGB 9.9* 9.5* 8.9*  HCT 31.5* 28.0* 30.2*  MCV 92.6  --  95.3  PLT 414*  --  161*   Basic Metabolic Panel: Recent Labs  Lab 05/28/20 1050 05/30/20 1153 06/02/20 0451  NA 139 141 142  K 2.9* 3.1* 2.7*  CL 100 101 102  CO2 30  --  31  GLUCOSE 112* 92 127*  BUN 25* 23 21  CREATININE 0.70 0.90 0.61  CALCIUM 10.3  --  9.6  MG  --   --  1.6*  GFR: Estimated Creatinine Clearance: 41.1 mL/min (by C-G formula based on SCr of 0.61 mg/dL). Liver Function Tests: No results for input(s): AST, ALT, ALKPHOS, BILITOT, PROT, ALBUMIN in the last 168 hours. No results for input(s): LIPASE, AMYLASE in the last 168 hours. No results for input(s): AMMONIA in the last 168 hours. Coagulation Profile: Recent Labs  Lab 05/28/20 1050 06/02/20 0451  INR 1.0 1.2   Cardiac Enzymes: No results for input(s): CKTOTAL, CKMB, CKMBINDEX, TROPONINI in the last 168 hours. BNP (last 3 results) No results for input(s): PROBNP in the last 8760 hours. HbA1C: No results for input(s): HGBA1C in the last 72 hours. CBG: No results for input(s): GLUCAP in the last 168 hours. Lipid Profile: No results for input(s): CHOL, HDL, LDLCALC, TRIG, CHOLHDL, LDLDIRECT in the last 72 hours. Thyroid Function Tests: No results for input(s): TSH, T4TOTAL, FREET4, T3FREE, THYROIDAB in the last 72 hours. Anemia Panel: No results for input(s): VITAMINB12, FOLATE, FERRITIN, TIBC, IRON, RETICCTPCT in the last 72 hours. Urine analysis:    Component Value Date/Time   COLORURINE YELLOW (A) 10/11/2017 0223   APPEARANCEUR HAZY (A) 10/11/2017 0223   APPEARANCEUR Clear 09/30/2012 0829   LABSPEC 1.013 10/11/2017  0223   LABSPEC 1.012 09/30/2012 0829   PHURINE 6.0 10/11/2017 0223   GLUCOSEU NEGATIVE 10/11/2017 0223   GLUCOSEU 50 mg/dL 09/30/2012 0829   HGBUR SMALL (A) 10/11/2017 0223   BILIRUBINUR NEGATIVE 10/11/2017 0223   BILIRUBINUR Negative 09/30/2012 0829   KETONESUR NEGATIVE 10/11/2017 0223   PROTEINUR NEGATIVE 10/11/2017 0223   NITRITE POSITIVE (A) 10/11/2017 0223   LEUKOCYTESUR SMALL (A) 10/11/2017 0223   LEUKOCYTESUR Negative 09/30/2012 0829    Radiological Exams on Admission: DG Chest 1 View  Result Date: 06/02/2020 CLINICAL DATA:  Shortness of breath, right lobe biopsy performed Wednesday EXAM: CHEST  1 VIEW COMPARISON:  Radiograph 12/16/2019, CT 05/30/2020 FINDINGS: There is persistent atelectasis and/or consolidation in the left lung base with a left pleural effusion as was seen on comparison CT imaging 3 days prior. Some additional bronchitic and bronchiectatic changes are seen diffusely throughout the lungs with some chronically coarsened interstitial changes and reticulonodular opacities throughout the lungs in a similar distribution to more remote comparison. No new consolidative opacity. No pneumothorax. No convincing CT features of edema at this time. The aorta is calcified. The remaining cardiomediastinal contours are unremarkable. No acute osseous or soft tissue abnormality. Degenerative changes are present in the imaged spine and shoulders. Telemetry leads and support devices overlie the chest. IMPRESSION: 1. Persistent atelectasis and/or consolidation in the left lung base with a left pleural effusion as was seen on comparison CT imaging. 2. Some chronic bronchitic/bronchiectatic changes with coarsened interstitium and diffuse reticulonodular opacities conspicuous for a long-standing/chronic atypical infection including mycobacterial etiologies. 3. No other acute cardiopulmonary abnormalities. 4. The aorta is calcified. The remaining cardiomediastinal contours are unremarkable.  Electronically Signed   By: Lovena Le M.D.   On: 06/02/2020 05:39    EKG: Independently reviewed. Nsr, rad  Assessment/Plan Active Problems:   CVA (cerebral vascular accident) ([place])   AF (paroxysmal atrial fibrillation) ([facility])   COPD with chronic bronchitis (HCC)   Chronic respiratory failure with hypoxia (HCC)   COPD (chronic obstructive pulmonary disease) with acute bronchitis (HCC)   Acute on chronic respiratory failure (HCC)   Lung cancer (HCC)   COPD with acute exacerbation (HCC)    # COPD with acute exacerbation # Acute on chronic hypoxic hypercapnic respiratory failure # Lung cancer Hx severe copd on  home o2. Recent bronch culture growing pseudomonas (sensitivities pending), brushings showing metastatic SCC. Here tachypnic to 40-50s satting normally on 5 L. Flu/covid neg - I have discussed with the patient she needs to go back on bipap and that if she can't tolerate that or doesn't improve may need to be intubated. She confirmed she is full code. Will place back on bipap and repeat ABG in a few hours - start zosyn and levaquin (discussed w/ Raul Del who advises double coverage for now) - duonebs scheduled, albuterol prn - methylpred 60 iv q6 - f/u culture results - pulmonology consulted  - cont home breo, daliresp, incurse, singulair  # Hypokalemia # hypomagnesemia k 2.7, mg 1.6. received mg 2 g and 80 of oral potassium. No sig ekg findings - tele - another 80 of oral k as well as fluids @ 75 with 40 of k ordered - PM bmp  # History CVA - cont simvastatin  # Parosyxmal a fib Here in sinus - apixaban  # hypothyroid - cont home synthroid  # MDD - cont home lexapro  DVT prophylaxis: doac Code Status: full  Family Communication: husband updated @ bedside  Consults called: pulm    Status is: Inpatient  Remains inpatient appropriate because:Inpatient level of care appropriate due to severity of illness   Dispo: The patient is from: Home               Anticipated d/c is to: tbd              Anticipated d/c date is: > 3 days              Patient currently is not medically stable to d/c.        Desma Maxim MD [facility] Pager [phone]  If 7PM-7AM, please contact night-coverage [email or web address] Password [email or web address]  06/02/2020, 8:08 AM

## 2020-06-02 NOTE — ED Triage Notes (Signed)
Pt BIB EMS for difficulty breathing. Pt has hx of COPD, started experiencing wheezing "late yesterday afternoon." Had biopsy of R lobe on Wednesday. Pt is chronically on 5L nasal cannula, O2 saturation 95% on 5L on arrival to ED. Pt also states she has productive cough with brown sputum but was told this was expected after the lung biopsy. Denies chest pain.

## 2020-06-02 NOTE — Progress Notes (Signed)
ANTICOAGULATION CONSULT NOTE - Initial Consult  Pharmacy Consult for Apixaban Indication: atrial fibrillation  Allergies  Allergen Reactions  . Rivaroxaban Hives    Patient Measurements: Height: 5\' 3"  (160 cm) Weight: 44.9 kg (99 lb) IBW/kg (Calculated) : 52.4  Vital Signs: Temp: 97.6 F (36.4 C) (01/08 0620) Temp Source: Oral (01/08 0620) BP: 112/53 (01/08 0801) Pulse Rate: 101 (01/08 0801)  Labs: Recent Labs    05/30/20 1153 06/02/20 0451 06/02/20 0632  HGB 9.5* 8.9*  --   HCT 28.0* 30.2*  --   PLT  --  441*  --   APTT  --  39*  --   LABPROT  --  14.8  --   INR  --  1.2  --   CREATININE 0.90 0.61  --   TROPONINIHS  --  15 12    Estimated Creatinine Clearance: 41.1 mL/min (by C-G formula based on SCr of 0.61 mg/dL).   Medical History: Past Medical History:  Diagnosis Date  . Afib ([place])   . Anxiety   . Atrial fibrillation ([place]) 2017  . Cervical stenosis of spine   . COPD (chronic obstructive pulmonary disease) ([place])   . Depression   . Hyperlipidemia   . Hypothyroidism   . Nodule of right lung   . On supplemental oxygen by nasal cannula   . 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 Rupture of extensor tendon of finger   . Spondylosis of cervical region without myelopathy or radiculopathy   . Stroke ([place])   . Vertigo   . Wartenberg syndrome     Assessment: 80 yo female with AFib on Apixaban 2.5 mg PO BID PTA. Discussed apixaban dose with hospitalist; SCr <1.5, Age <80 years, <60 kg. Per MD, Hgb is stable and no report of bleeding.    Goal of Therapy:  Monitor platelets by anticoagulation protocol: Yes   Plan:  Will order Apixaban 5 mg PO BID SCr and CBC at least every three days  Rocky Morel 06/02/2020,10:07 AM

## 2020-06-02 NOTE — Consult Note (Signed)
CC   CCC  CC: Respiratory failure  SWN:IOEV is a 6 year ol lady with multiple problems who came in with sob, hypoxia, reecntly diagnosed with lung cancer, s/o ebus, flexible bronch, 05/30/20. There is no massive hemoptysis, ptx, on eliquis, known hx of copd.  Presently on bipap, somewhat sedated due to ativan, on 50 %, fio2, sats 100 %.   Discussed with the respiratory team and her husband. No angina or pleurisy. Covid, flu negative.    PAST MEDICAL HISTORY       Past Medical History:  Diagnosis Date  . Afib ([facility])   . Anxiety   . Atrial fibrillation ([place]) 2017  . Cervical stenosis of spine   . COPD (chronic obstructive pulmonary disease) ([facility])   . Depression   . Hyperlipidemia   . Hypothyroidism   . Nodule of right lung   . On supplemental oxygen by nasal cannula   . 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 Rupture of extensor tendon of finger   . Spondylosis of cervical region without myelopathy or radiculopathy   . Stroke ([facility])   . Vertigo   . Wartenberg syndrome      SURGICAL HISTORY        Past Surgical History:  Procedure Laterality Date  . ABDOMINAL HYSTERECTOMY    . APPENDECTOMY    . BREAST BIOPSY Left 1971  . BREAST CYST EXCISION Left 1971  . CATARACT SURGERY Bilateral   . LOOP RECORDER IMPLANT    . OOPHORECTOMY Bilateral      FAMILY HISTORY   History reviewed. No pertinent family history.   SOCIAL HISTORY   Social History   Tobacco Use  . Smoking status: Former Research scientist (life sciences)  . Smokeless tobacco: Never Used  Vaping Use  . Vaping Use: Never used  Substance Use Topics  . Alcohol use: Never  . Drug use: Never     MEDICATIONS    Home Medication:    Current Medication:  Current Facility-Administered Medications:  .  butamben-tetracaine-benzocaine (CETACAINE) spray 1 spray, 1 spray, Topical, Once, Aleskerov, Fuad, MD .  chlorhexidine (PERIDEX) 0.12 % solution 15 mL, 15 mL, Mouth/Throat, Once **OR**  MEDLINE mouth rinse, 15 mL, Mouth Rinse, Once, Molli Barrows, MD .  famotidine (PEPCID) tablet 20 mg, 20 mg, Oral, Once, Karen Kitchens, NP .  lactated ringers infusion, , Intravenous, Continuous, Molli Barrows, MD, Last Rate: 10 mL/hr at 05/30/20 1150, New Bag at 05/30/20 1150 .  lidocaine (PF) (XYLOCAINE) 1 % injection 30 mL, 30 mL, Other, Once, Aleskerov, Fuad, MD .  lidocaine (XYLOCAINE) 2 % jelly 1 application, 1 application, Topical, Once, Aleskerov, Fuad, MD .  phenylephrine (NEO-SYNEPHRINE) 0.25 % nasal spray 1 spray, 1 spray, Each Nare, Q6H PRN, Ottie Glazier, MD    ALLERGIES   Rivaroxaban     REVIEW OF SYSTEMS    Review of Systems:  Gen:  Denies  fever, sweats, chills weigh loss  HEENT: Denies blurred vision, double vision, ear pain, eye pain, hearing loss, nose bleeds, sore throat Cardiac:  No dizziness, chest pain or heaviness, chest tightness,edema Resp:   Denies cough or sputum porduction, shortness of breath,wheezing, hemoptysis,  Gi: Denies swallowing difficulty, stomach pain, nausea or vomiting, diarrhea, constipation, bowel incontinence Gu:  Denies bladder incontinence, burning urine Ext:   Denies Joint pain, stiffness or swelling Skin: Denies  skin rash, easy bruising or bleeding or hives Endoc:  Denies polyuria, polydipsia , polyphagia or weight change Psych:   Denies  depression, insomnia or hallucinations   Other:  All other systems negative   VS: BP (!) 156/73   Pulse 97   Temp 98.5 F (36.9 C) (Temporal)   Resp 22   Ht 5\' 3"  (1.6 m)   Wt 49 kg   SpO2 100 %   BMI 19.14 kg/m   On fio2 of 0.5    PHYSICAL EXAM    GENERAL: Frial, bipap is on, sleeping, husband at thebed side HEAD: Normocephalic, atraumatic.  EYES: Pupils equal, round, reactive to light. Extraocular muscles intact. No scleral icterus.  MOUTH: dry membrane. t. No abscess noted.  EAR, NOSE, THROAT: Clear without exudates. No external lesions.  NECK: Supple.  No thyromegaly. No nodules. No JVD.  PULMONARY: Decreased air entrly bilaterally, coarse CARDIOVASCULAR: S1 and S2. Regular rate and rhythm. No murmurs, rubs, or gallops. No edema. Pedal pulses 2+ bilaterally.  GASTROINTESTINAL: Soft, nontender, nondistended. No masses. Positive bowel sounds. No hepatosplenomegaly.  MUSCULOSKELETAL: No swelling, clubbing, or edema. Range of motion full in all extremities.  NEUROLOGIC: Cranial nerves II through XII are intact. No gross focal neurological deficits. Sensation intact. Reflexes intact.  SKIN: No ulceration, lesions, rashes, or cyanosis. Skin warm and dry. Turgor intact.  PSYCHIATRIC: arousable     IMAGING     Imaging Results  CT CHEST WO CONTRAST  Result Date: 05/30/2020 CLINICAL DATA:  Shortness of breath.  Preop for bronchoscopy. EXAM: CT CHEST WITHOUT CONTRAST TECHNIQUE: Multidetector CT imaging of the chest was performed following the standard protocol without IV contrast. COMPARISON:  May 03, 2020. FINDINGS: Cardiovascular: Atherosclerosis of thoracic aorta is noted without aneurysm formation. Normal cardiac size. Small pericardial effusion is noted. Coronary artery calcifications are noted. Mediastinum/Nodes: As noted on prior exam, there is extensive mediastinal adenopathy present particularly in the right paratracheal pretracheal subcarinal and left hilar regions. This results in narrowing of the left mainstem bronchus. Thyroid gland is unremarkable. Esophagus is unremarkable. Lungs/Pleura: No pneumothorax is noted. Mild biapical scarring is noted. Mild to moderate left pleural effusion is noted with adjacent left lower lobe postobstructive atelectasis or infiltrate. Upper Abdomen: No acute abnormality. Musculoskeletal: No chest wall mass or suspicious bone lesions identified. IMPRESSION: 1. Mild to moderate left pleural effusion is noted with adjacent left lower lobe postobstructive atelectasis or infiltrate. 2. Small pericardial  effusion is noted. 3. Coronary artery calcifications are noted suggesting coronary artery disease. 4. As noted on prior exam, there is extensive mediastinal adenopathy present particularly in the right paratracheal, subcarinal and left hilar regions. This results in narrowing of the left mainstem bronchus. This is concerning for malignancy or metastatic disease. Aortic Atherosclerosis (ICD10-I70.0). Electronically Signed   By: Marijo Conception M.D.   On: 05/30/2020 09:32   CT ANGIO CHEST PE W OR WO CONTRAST  Result Date: 05/03/2020 CLINICAL DATA:  COPD, shortness of breath EXAM: CT ANGIOGRAPHY CHEST WITH CONTRAST TECHNIQUE: Multidetector CT imaging of the chest was performed using the standard protocol during bolus administration of intravenous contrast. Multiplanar CT image reconstructions and MIPs were obtained to evaluate the vascular anatomy. CONTRAST:  98mL OMNIPAQUE IOHEXOL 350 MG/ML SOLN COMPARISON:  10/26/2019 FINDINGS: Cardiovascular: Heavily calcified aorta. Scattered coronary artery calcifications. Mild cardiomegaly. Small pericardial effusion, increased in size since prior study. No evidence of aortic aneurysm. No evidence of pulmonary embolus. Mediastinum/Nodes: Worsening subcarinal adenopathy. Conglomerate subcarinal mass has a short axis diameter of 4.2 cm compared with 2.5 cm previously. Worsening left hilar adenopathy. Short axis diameter 2 cm compared with 1.8 cm previously.  Right inferior hilar adenopathy also increased with a short axis diameter of 2.6 cm compared with 1.7 cm previously. Mild right hilar adenopathy with index node having a short axis diameter of 12 mm, new since prior study. Right paratracheal lymph node has a short axis diameter of 2.3 cm compared with 7 mm previously. Lungs/Pleura: Small left pleural effusion. Left lower lobe airspace opacity may be related to postobstructive process from left hilar adenopathy and could reflect atelectasis or infiltrate/pneumonia.  Bronchiectasis and mucous plugging within the lower lobes bilaterally. Extensive peribronchovascular micro and macro nodularity, also stable. Spiculated nodule in the right upper lobe measures 10 mm compared with 6 mm previously. Scarring in the apices. Mild emphysema. Upper Abdomen: Imaging into the upper abdomen demonstrates no acute findings. Musculoskeletal: Chest wall soft tissues are unremarkable. No acute bony abnormality. Review of the MIP images confirms the above findings. IMPRESSION: No evidence of pulmonary embolus. Bulky mediastinal and left hilar adenopathy. Mildly enlarged right hilar lymph nodes. This has progressed since prior study. Postobstructive process in the left lower lobe, atelectasis versus infiltrate due to the enlarged left hilar lymph nodes. Small left pleural effusion. Continued areas of bronchiectasis and peribronchovascular nodularity, stable since prior study. Cardiomegaly, small pericardial effusion, increasing since prior study. Aortic Atherosclerosis (ICD10-I70.0) and Emphysema (ICD10-J43.9). Electronically Signed   By: Rolm Baptise M.D.   On: 05/03/2020 13:56      Component Ref Range & Units 12:00 04:51  FIO2  0.40  0.45   Delivery systems  BILEVEL POSITIVE AIRWAY PRESSURE  BILEVEL POSITIVE AIRWAY PRESSURE   LHR resp/min 20    Inspiratory PAP  12    Expiratory PAP  6    pH, Arterial 7.350 - 7.450 7.40    pCO2 arterial 32.0 - 48.0 mmHg 43    pO2, Arterial 83.0 - 108.0 mmHg 121High    Bicarbonate 20.0 - 28.0 mmol/L 26.6  32.5High   Acid-Base Excess 0.0 - 2.0 mmol/L 1.6  5.2High   O2 Saturation % 98.7  27.0   Patient temperature  37.0  37.0   Collection site  RIGHT RADIAL  VEIN   Sample type  ARTERIAL DRAW  VENOUS CM    Component 3 d ago  Specimen Description BRONCHIAL WASHINGS  Performed at [facility], [address], [place], [place] [address]   Special Requests NONE  Performed at [facility], [address], [place],  [place] [address]   Gram Stain MODERATE WBC PRESENT, PREDOMINANTLY PMN  MODERATE GRAM NEGATIVE RODS   Culture MODERATE PSEUDOMONAS AERUGINOSA  REPEATING SUSCEPTIBILITY  Performed at [facility], [place] [address], [place], [place] [id number]   Report Status PENDING    IMPRESSION: 1. Persistent atelectasis and/or consolidation in the left lung base with a left pleural effusion as was seen on comparison CT imaging. 2. Some chronic bronchitic/bronchiectatic changes with coarsened interstitium and diffuse reticulonodular opacities conspicuous for a long-standing/chronic atypical infection including mycobacterial etiologies. 3. No other acute cardiopulmonary abnormalities. 4. The aorta is calcified. The remaining cardiomediastinal contours are unremarkable.   Electronically Signed   By: Lovena Le M.D.   On: 06/02/2020 05:39 ASSESSMENT/PLAN   S/p ebus and flexible bronch 05/30/20, path positive for malignancy. Husband is aware.   Impression The patien is here complaining of increase shortness of breath, hypoxic, ? Basal pneumonia, sputum + pseudomonas. No pneumothorax, on eliquis. Hx of copd, suspect exacerbation. -agree with steroids -nebs as you doing ( duo neb) -pulimicort 0.5 bid or the equivilent -  pseudomonas coverage (zosyn, levaquin) is reasonable) -wean bipap and fio2 as tolerated  -following with you    Left lower lobe infiltraet with mediastial mass and paratracheal /mediastinal lymphadenopathy, path +ve malignancy. Poor prognosis -oncology consult -palliative input X covering for Dr. Lanney Gins     Thank you for allowing me to participate in the care of this patient.

## 2020-06-02 NOTE — Progress Notes (Addendum)
Pt came in with a potassium of 2.7, pt was placed on 0.9 % NACL with 40 mEQ of potassium. Pt potassium came up at 5.2 at 1738. Notified Ouma NP. Will continue to monitor.  Update 2100: Unable to complete assessment. Pt still on continuous BIPAP and asleep at this time. Will notify incoming shift.  Update 2002: Ouma NP placed order to discontinue fluids. Fluids was stopped. Will continue to monitor.  Update 2106: Pt is on continous BIPAP and asleep at this time. Pt have a scheduled eliquis PO and synthorid PO. Notify NP Ouma if theres a way we can switch it to IV and Subq for the maintime. NP ouma states will look at the pt chart. Will continue to monitor.  Update 2207: Ouma NP came and assessed pt at bedside and okay to give the PO eliquis. Pt was more alert and answering question when the NP came, staff RN was presence during this time. Staff RN give the medicine with the assistance from respiratory to take pt off BIPAP. After medication administration pt was placed back on BIPAP. After 10 minutes of taking the medicine and drinking water pt became distress and " states I can't breath". Staff informed pt that she will give her the anxiety medicine (lorazepam 1 mg IV).  Staff called RT for assistance. Staff RN administered lorazepam RT was presence at this time after 5 minutes pt start to calm down and just resting in bed. Ouma made aware and states will change the order of synthroid tablet to IV. Will notify incoming shift. Will continue to monitor.  Update 0330: Pt reporting being anxious again. Ouma NP made aware and ordered to give the lorezepam 1 mg IV. Medicine given. Will continue to monitor.

## 2020-06-02 NOTE — ED Notes (Signed)
PT is pulling at the

## 2020-06-03 ENCOUNTER — Inpatient Hospital Stay: Payer: Medicare Other

## 2020-06-03 DIAGNOSIS — J9622 Acute and chronic respiratory failure with hypercapnia: Secondary | ICD-10-CM | POA: Diagnosis not present

## 2020-06-03 DIAGNOSIS — J441 Chronic obstructive pulmonary disease with (acute) exacerbation: Secondary | ICD-10-CM | POA: Diagnosis not present

## 2020-06-03 DIAGNOSIS — J9621 Acute and chronic respiratory failure with hypoxia: Secondary | ICD-10-CM

## 2020-06-03 LAB — CBC
HCT: 27.4 % — ABNORMAL LOW (ref 36.0–46.0)
Hemoglobin: 8.5 g/dL — ABNORMAL LOW (ref 12.0–15.0)
MCH: 29.2 pg (ref 26.0–34.0)
MCHC: 31 g/dL (ref 30.0–36.0)
MCV: 94.2 fL (ref 80.0–100.0)
Platelets: 358 10*3/uL (ref 150–400)
RBC: 2.91 MIL/uL — ABNORMAL LOW (ref 3.87–5.11)
RDW: 15 % (ref 11.5–15.5)
WBC: 11.5 10*3/uL — ABNORMAL HIGH (ref 4.0–10.5)
nRBC: 0 % (ref 0.0–0.2)

## 2020-06-03 LAB — BLOOD GAS, VENOUS
Acid-Base Excess: 1.5 mmol/L (ref 0.0–2.0)
Bicarbonate: 25.9 mmol/L (ref 20.0–28.0)
O2 Saturation: 77.9 %
Patient temperature: 37
pCO2, Ven: 39 mmHg — ABNORMAL LOW (ref 44.0–60.0)
pH, Ven: 7.43 (ref 7.250–7.430)
pO2, Ven: 41 mmHg (ref 32.0–45.0)

## 2020-06-03 LAB — BASIC METABOLIC PANEL
Anion gap: 9 (ref 5–15)
BUN: 22 mg/dL (ref 8–23)
CO2: 24 mmol/L (ref 22–32)
Calcium: 9.5 mg/dL (ref 8.9–10.3)
Chloride: 110 mmol/L (ref 98–111)
Creatinine, Ser: 0.65 mg/dL (ref 0.44–1.00)
GFR, Estimated: >60 mL/min (ref >60)
Glucose, Bld: 139 mg/dL — ABNORMAL HIGH (ref 70–99)
Potassium: 4.5 mmol/L (ref 3.5–5.1)
Sodium: 143 mmol/L (ref 135–145)

## 2020-06-03 LAB — D-DIMER, QUANTITATIVE: D-Dimer, Quant: 0.87 ug/mL-FEU — ABNORMAL HIGH (ref 0.00–0.50)

## 2020-06-03 LAB — MAGNESIUM: Magnesium: 2.1 mg/dL (ref 1.7–2.4)

## 2020-06-03 MED ORDER — METHYLPREDNISOLONE SODIUM SUCC 125 MG IJ SOLR
60.0000 mg | Freq: Two times a day (BID) | INTRAMUSCULAR | Status: DC
  Administered 2020-06-03 – 2020-06-04 (×2): 60 mg via INTRAVENOUS
  Filled 2020-06-03 (×2): qty 2

## 2020-06-03 NOTE — Plan of Care (Signed)
°  Problem: Clinical Measurements: °Goal: Respiratory complications will improve °Outcome: Progressing °  °Problem: Clinical Measurements: °Goal: Cardiovascular complication will be avoided °Outcome: Progressing °  °Problem: Pain Managment: °Goal: General experience of comfort will improve °Outcome: Progressing °  °Problem: Safety: °Goal: Ability to remain free from injury will improve °Outcome: Progressing °  °

## 2020-06-03 NOTE — Progress Notes (Signed)
   06/02/20 1935  Assess: MEWS Score  Temp 97.8 F (36.6 C)  BP 135/73  Pulse Rate 100  ECG Heart Rate 98  Resp (!) 31  Level of Consciousness Alert  SpO2 95 %  O2 Device Bi-PAP  Assess: MEWS Score  MEWS Temp 0  MEWS Systolic 0  MEWS Pulse 0  MEWS RR 2  MEWS LOC 0  MEWS Score 2  MEWS Score Color Yellow  Assess: if the MEWS score is Yellow or Red  Were vital signs taken at a resting state? Yes  Focused Assessment No change from prior assessment  Early Detection of Sepsis Score *See Row Information* Medium  MEWS guidelines implemented *See Row Information* Yes  Treat  Pain Scale 0-10  Pain Score 0  Take Vital Signs  Increase Vital Sign Frequency  Yellow: Q 2hr X 2 then Q 4hr X 2, if remains yellow, continue Q 4hrs  Escalate  MEWS: Escalate Yellow: discuss with charge nurse/RN and consider discussing with provider and RRT  Notify: Charge Nurse/RN  Name of Charge Nurse/RN Notified Hinton Lovely, RN  Date Charge Nurse/RN Notified 06/02/20  Time Charge Nurse/RN Notified 2000

## 2020-06-03 NOTE — Progress Notes (Signed)
Pulmonary Medicine          Date: 06/03/2020,   MRN# [id number] Christine Morris 02/12/1941     AdmissionWeight: 44.9 kg                 CurrentWeight: 47.4 kg      CHIEF COMPLAINT:   Mediastinal mass with LLL infiltrate   HISTORY OF PRESENT ILLNESS   Pleasant 80 year old female who was previously seen for worsening dyspnea and advanced COPD with chronic hypoxemia. Patient was last seen in June 2020. She is currently using oxygen 24 hours a day at 3 to 4 L/min. Previously she was on Incruse Ellipta as well as [employer], we had initiated DuoNeb nebulizer therapy as well as Roflumilast 250 mcg daily. Patient states she had initiated this therapy and feels slightly improved.   She continues to make thick tenacious phlegm however reports no problems expectorating it. Today we had discussed bronchopulmonary hygiene techniques including incentive spirometer to combat atelectasis and patient will have one ordered.   She also has bronchiectasis which is COPD related and remains high risk for pneumonia and hospitalization as in January 2020. We had also discussed using low-dose Zithromax thrice weekly for chronic suppressive therapy and patient is agreeable to this plan. I had encouraged patient to participate in graduated exercise program/pulmonary rehab. She denies constitutional symptoms.  08/02/19- O2 concentrator stopped working and she was able to notify [employer], they were kind enough to replace it with a new device on Monday.   12/12/19- patient states she had episodes of epistaxis, ironically she reports using flonase and this somehow has helped her.  Patient had CT chest 10/27/19 we reviewed this together, there is findings suggestive of atypical granulomatous infection such as MAC as well as med/hilar lymphadenopathy suggestive of possible neoplasm, we will obtian PET as recommended.   She stopped using her Inentive spirometer but we have encouraged her to use it more and  she is doing that.   02/14/20- patient was hospitalized for respiratory distress and syncope, she was thought to have possible arrythmia and was monitored with telemetry but subsequently improved and d/cd. She had findings of low BP. She reports staying well hydrated drinking water several times daily and also drinking pepsi. She has good appetite and is eating as well as using ensure shakes to supplement for nourishment but despite all this she has lost 3 more lbs. She hat PET scan we discussed in detail regardign RLL nodule and hilar lymphadenopathy, patient states she feels she will not be able to undergo biopsy or therapy for lung ca and wishes to wait for now and think about it with husband. In the interim we discussed modalities to remove phlegm such as addition of mucomyst to nebulizer regimen. She is to continue TOBI for bronchiectasis  Patient is here today for biopsy of mediastinal mass and airway inspection of lLL infiltrate with post obstructive process.    06/03/20- patient improved clinically she is now on 30Fio2 -BIPAP 12/6.  I have ordered ddimer to rule out dvt/pe and ordered US dvt LE study for today.  If negative on both will not order CTPE protocol since patient just had CT done to reduce radiation load as she does have oncology therapy planned.  Will obtain VBG and remove BIPAP today if able.  Reviewed care plan with patient and husband.  Secure message sent to respiratory therapist.   PAST MEDICAL HISTORY   Past Medical History:  Diagnosis Date  .  Afib ([place])   . Anxiety   . Atrial fibrillation ([place]) 2017  . Cervical stenosis of spine   . COPD (chronic obstructive pulmonary disease) ([place])   . Depression   . Hyperlipidemia   . Hypothyroidism   . Nodule of right lung   . On supplemental oxygen by nasal cannula   . 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 Rupture of extensor tendon of finger   . Spondylosis of cervical region without myelopathy or radiculopathy   . Stroke  ([place])   . Vertigo   . Wartenberg syndrome      SURGICAL HISTORY   Past Surgical History:  Procedure Laterality Date  . ABDOMINAL HYSTERECTOMY    . APPENDECTOMY    . BREAST BIOPSY Left 1971  . BREAST CYST EXCISION Left 1971  . CATARACT SURGERY Bilateral   . LOOP RECORDER IMPLANT    . OOPHORECTOMY Bilateral   . VIDEO BRONCHOSCOPY WITH ENDOBRONCHIAL NAVIGATION N/A 05/30/2020   Procedure: VIDEO BRONCHOSCOPY WITH ENDOBRONCHIAL NAVIGATION;  Surgeon: Ottie Glazier, MD;  Location: [facility] ORS;  Service: Thoracic;  Laterality: N/A;  . VIDEO BRONCHOSCOPY WITH ENDOBRONCHIAL ULTRASOUND N/A 05/30/2020   Procedure: VIDEO BRONCHOSCOPY WITH ENDOBRONCHIAL ULTRASOUND;  Surgeon: Ottie Glazier, MD;  Location: [facility];  Service: Thoracic;  Laterality: N/A;     FAMILY HISTORY   History reviewed. No pertinent family history.   SOCIAL HISTORY   Social History   Tobacco Use  . Smoking status: Former Research scientist (life sciences)  . Smokeless tobacco: Never Used  Vaping Use  . Vaping Use: Never used  Substance Use Topics  . Alcohol use: Never  . Drug use: Never     MEDICATIONS    Home Medication:    Current Medication:  Current Facility-Administered Medications:  .  0.9 %  sodium chloride infusion, 250 mL, Intravenous, PRN, Wouk, Ailene Rud, MD, Last Rate: 10 mL/hr at 06/03/20 0526, 250 mL at 06/03/20 0526 .  albuterol (PROVENTIL) (2.5 MG/3ML) 0.083% nebulizer solution 2.5 mg, 2.5 mg, Nebulization, Q2H PRN, Wouk, Ailene Rud, MD .  apixaban [facility]) tablet 5 mg, 5 mg, Oral, BID, Rocky Morel, RPH, 5 mg at 06/02/20 2236 .  escitalopram (LEXAPRO) tablet 20 mg, 20 mg, Oral, Daily, Wouk, Ailene Rud, MD, 20 mg at 06/02/20 1241 .  fluticasone furoate-vilanterol (BREO ELLIPTA) 200-25 MCG/INH 1 puff, 1 puff, Inhalation, Daily, Wouk, Ailene Rud, MD, 1 puff at 06/02/20 1438 .  ipratropium-albuterol (DUONEB) 0.5-2.5 (3) MG/3ML nebulizer solution 3 mL, 3 mL, Nebulization, Q6H, Wouk, Ailene Rud, MD, 3 mL at  06/03/20 0755 .  levofloxacin (LEVAQUIN) IVPB 750 mg, 750 mg, Intravenous, Q48H, Oswald Hillock, RPH, Last Rate: 100 mL/hr at 06/03/20 0530, 750 mg at 06/03/20 0530 .  levothyroxine (SYNTHROID, LEVOTHROID) injection 38 mcg, 38 mcg, Intravenous, Daily, Ouma, Bing Neighbors, NP .  LORazepam (ATIVAN) injection 1 mg, 1 mg, Intravenous, Q6H PRN, Si Raider, Ailene Rud, MD, 1 mg at 06/03/20 5329 .  methylPREDNISolone sodium succinate (SOLU-MEDROL) 125 mg/2 mL injection 60 mg, 60 mg, Intravenous, Q6H, Wouk, Ailene Rud, MD, 60 mg at 06/03/20 0845 .  montelukast (SINGULAIR) tablet 10 mg, 10 mg, Oral, BH-q7a, Wouk, Ailene Rud, MD, 10 mg at 06/02/20 1436 .  piperacillin-tazobactam (ZOSYN) IVPB 3.375 g, 3.375 g, Intravenous, Q8H, Rocky Morel, RPH, Last Rate: 12.5 mL/hr at 06/03/20 0724, 3.375 g at 06/03/20 0724 .  roflumilast (DALIRESP) tablet 250 mcg, 250 mcg, Oral, Daily, Wouk, Ailene Rud, MD, 250 mcg at 06/02/20 1435 .  simvastatin (ZOCOR)  tablet 40 mg, 40 mg, Oral, QPM, Wouk, Ailene Rud, MD .  sodium chloride flush (NS) 0.9 % injection 3 mL, 3 mL, Intravenous, Q12H, Wouk, Ailene Rud, MD, 3 mL at 06/03/20 0940 .  sodium chloride flush (NS) 0.9 % injection 3 mL, 3 mL, Intravenous, PRN, Wouk, Ailene Rud, MD .  umeclidinium bromide (INCRUSE ELLIPTA) 62.5 MCG/INH 1 puff, 1 puff, Inhalation, Daily, Wouk, Ailene Rud, MD    ALLERGIES   Rivaroxaban     REVIEW OF SYSTEMS    Review of Systems:  Gen:  Denies  fever, sweats, chills weigh loss  HEENT: Denies blurred vision, double vision, ear pain, eye pain, hearing loss, nose bleeds, sore throat Cardiac:  No dizziness, chest pain or heaviness, chest tightness,edema Resp:   Denies cough or sputum porduction, shortness of breath,wheezing, hemoptysis,  Gi: Denies swallowing difficulty, stomach pain, nausea or vomiting, diarrhea, constipation, bowel incontinence Gu:  Denies bladder incontinence, burning urine Ext:   Denies Joint pain,  stiffness or swelling Skin: Denies  skin rash, easy bruising or bleeding or hives Endoc:  Denies polyuria, polydipsia , polyphagia or weight change Psych:   Denies depression, insomnia or hallucinations   Other:  All other systems negative   VS: BP (!) 151/77 (BP Location: Right Arm)   Pulse 99   Temp 98.1 F (36.7 C) (Oral)   Resp (!) 21   Ht _0  (1.6 m)   Wt 47.4 kg   SpO2 98%   BMI 18.51 kg/m      PHYSICAL EXAM    GENERAL:NAD, no fevers, chills, no weakness no fatigue HEAD: Normocephalic, atraumatic.  EYES: Pupils equal, round, reactive to light. Extraocular muscles intact. No scleral icterus.  MOUTH: Moist mucosal membrane. Dentition intact. No abscess noted.  EAR, NOSE, THROAT: Clear without exudates. No external lesions.  NECK: Supple. No thyromegaly. No nodules. No JVD.  PULMONARY: Decreased air entrly bilaterally CARDIOVASCULAR: S1 and S2. Regular rate and rhythm. No murmurs, rubs, or gallops. No edema. Pedal pulses 2+ bilaterally.  GASTROINTESTINAL: Soft, nontender, nondistended. No masses. Positive bowel sounds. No hepatosplenomegaly.  MUSCULOSKELETAL: No swelling, clubbing, or edema. Range of motion full in all extremities.  NEUROLOGIC: Cranial nerves II through XII are intact. No gross focal neurological deficits. Sensation intact. Reflexes intact.  SKIN: No ulceration, lesions, rashes, or cyanosis. Skin warm and dry. Turgor intact.  PSYCHIATRIC: Mood, affect within normal limits. The patient is awake, alert and oriented x 3. Insight, judgment intact.       IMAGING    DG Chest 1 View  Result Date: 06/02/2020 CLINICAL DATA:  Shortness of breath, right lobe biopsy performed Wednesday EXAM: CHEST  1 VIEW COMPARISON:  Radiograph 12/16/2019, CT 05/30/2020 FINDINGS: There is persistent atelectasis and/or consolidation in the left lung base with a left pleural effusion as was seen on comparison CT imaging 3 days prior. Some additional bronchitic and bronchiectatic  changes are seen diffusely throughout the lungs with some chronically coarsened interstitial changes and reticulonodular opacities throughout the lungs in a similar distribution to more remote comparison. No new consolidative opacity. No pneumothorax. No convincing CT features of edema at this time. The aorta is calcified. The remaining cardiomediastinal contours are unremarkable. No acute osseous or soft tissue abnormality. Degenerative changes are present in the imaged spine and shoulders. Telemetry leads and support devices overlie the chest. IMPRESSION: 1. Persistent atelectasis and/or consolidation in the left lung base with a left pleural effusion as was seen on comparison CT imaging. 2. Some chronic bronchitic/bronchiectatic  changes with coarsened interstitium and diffuse reticulonodular opacities conspicuous for a long-standing/chronic atypical infection including mycobacterial etiologies. 3. No other acute cardiopulmonary abnormalities. 4. The aorta is calcified. The remaining cardiomediastinal contours are unremarkable. Electronically Signed   By: Lovena Le M.D.   On: 06/02/2020 05:39   CT CHEST WO CONTRAST  Result Date: 05/30/2020 CLINICAL DATA:  Shortness of breath.  Preop for bronchoscopy. EXAM: CT CHEST WITHOUT CONTRAST TECHNIQUE: Multidetector CT imaging of the chest was performed following the standard protocol without IV contrast. COMPARISON:  May 03, 2020. FINDINGS: Cardiovascular: Atherosclerosis of thoracic aorta is noted without aneurysm formation. Normal cardiac size. Small pericardial effusion is noted. Coronary artery calcifications are noted. Mediastinum/Nodes: As noted on prior exam, there is extensive mediastinal adenopathy present particularly in the right paratracheal pretracheal subcarinal and left hilar regions. This results in narrowing of the left mainstem bronchus. Thyroid gland is unremarkable. Esophagus is unremarkable. Lungs/Pleura: No pneumothorax is noted. Mild  biapical scarring is noted. Mild to moderate left pleural effusion is noted with adjacent left lower lobe postobstructive atelectasis or infiltrate. Upper Abdomen: No acute abnormality. Musculoskeletal: No chest wall mass or suspicious bone lesions identified. IMPRESSION: 1. Mild to moderate left pleural effusion is noted with adjacent left lower lobe postobstructive atelectasis or infiltrate. 2. Small pericardial effusion is noted. 3. Coronary artery calcifications are noted suggesting coronary artery disease. 4. As noted on prior exam, there is extensive mediastinal adenopathy present particularly in the right paratracheal, subcarinal and left hilar regions. This results in narrowing of the left mainstem bronchus. This is concerning for malignancy or metastatic disease. Aortic Atherosclerosis (ICD10-I70.0). Electronically Signed   By: Marijo Conception M.D.   On: 05/30/2020 09:32          ASSESSMENT/PLAN    Acute on chronic hypoxemic respiratory failure Due to Acute moderate exacerbation of COPD   - agree with levoquin and steroids - have stepped down to bid solumedrol 64m from tid.   - ddimer to r/o PE  - dvt [place] r/o DVT  -will consider PE study if above is abnormal   - called and spoke with daugther [name] today    Extensive stage small cell lung cancer   - new diagnosis and may be cause of current exacerbation as mediastinal mass effect is singificant   - Have contacted oncologist - Dr RJanese Banksand patient has follow up set up on outpatient.    Anxiety disorder NOS   - complicating respiratory status    - patient reports episodes of breathlessness and feelings of suffocation and drowing due to advanced chronic lung disease.    - she appreciates and responds well do TID PRN ativan 136m         Thank you for allowing me to participate in the care of this patient.    Patient/Family are satisfied with care plan and all questions have been answered.  This document was prepared using  Dragon voice recognition software and may include unintentional dictation errors.     FuOttie GlazierM.D.  Division of [facility]

## 2020-06-03 NOTE — Progress Notes (Signed)
PROGRESS NOTE    Christine Morris  HBZ:[id number] DOB: 1940/07/18 DOA: 06/02/2020 PCP: Christine Picket, MD    Assessment & Plan:   Active Problems:   CVA (cerebral vascular accident) ([place])   AF (paroxysmal atrial fibrillation) (HCC)   COPD with chronic bronchitis (HCC)   Chronic respiratory failure with hypoxia (HCC)   COPD (chronic obstructive pulmonary disease) with acute bronchitis (HCC)   Acute on chronic respiratory failure (HCC)   Lung cancer ([place])   COPD with acute exacerbation (HCC)   Christine Morris is a 80 y.o. female with medical history significant for severe copd, home o2 4 L, paroxysmal a fib on doac, hypothyroid, and recent dx of lung cancer, who presents with dyspnea.  Followed by [facility]. Bronchoscopy w/ lavage 05/30/20, brushings showing metastatic scc, culture showing pseudomonas. Has onc f/u scheduled later this month.   # COPD with acute exacerbation # Acute on chronic hypoxic hypercapnic respiratory failure # Hx severe copd on home o2.  --Here tachypnic to 40-50s satting normally on 5 L. Flu/covid neg.  Pt was put on BiPAP. --started on methylpred 60 iv q6, DuoNeb --pulm consulted Plan: --cont IV solumedrol --schedule DuoNeb - cont home breo, daliresp, incurse, singulair --cont BiPAP and wean to [place] when able  # Sepsis 2/2 pseudomonas PNA --tachycardia, tachypnea, leukocytosis.  No fever, neg procal.  Bronch lavage positive for pseudomonas - started on zosyn and levaquin on admission (discussed w/ Raul Del who advises double coverage for now) Plan: --cont IV zosyn and Levaquin  # Small cell lung cancer --oncology consult  # Hypokalemia # hypomagnesemia --monitor and replete PRN  # History CVA - cont simvastatin  # Parosyxmal a fib Here in sinus - apixaban  # hypothyroid - cont home synthroid  # MDD - cont home lexapro  Anxiety --complicating respiratory status --ativan 15m TID PRN   DVT prophylaxis: OBO:FBPZWCHCode  Status: Full code  Family Communication: husband updated at bedside Status is: inpatient Dispo:   The patient is from: home Anticipated d/c is to: home Anticipated d/c date is: 2-3 days Patient currently is not medically ready to d/c due to: on BiPAP   Subjective and Interval History:  Pt reported chronic dyspnea, and no appetite.  Still on BiPAP.     Objective: Vitals:   06/05/20 1516 06/05/20 2000 06/05/20 2022 06/06/20 0500  BP: 117/77 121/74    Pulse: 98 86    Resp: 17 17    Temp: 98.2 F (36.8 C) 98.1 F (36.7 C)    TempSrc: Oral Oral    SpO2: 100% 98% 99%   Weight:    45.2 kg  Height:        Intake/Output Summary (Last 24 hours) at 06/06/2020 0525 Last data filed at 06/05/2020 2100 Gross per 24 hour  Intake 480 ml  Output 1050 ml  Net -570 ml   Filed Weights   06/04/20 0400 06/05/20 0331 06/06/20 0500  Weight: 46.1 kg 43 kg 45.2 kg    Examination:   Constitutional: NAD, AAOx3 HEENT: conjunctivae and lids normal, EOMI CV: No cyanosis.   RESP: diffuse rhonchi, on BiPAP Extremities: No effusions, edema in BLE SKIN: warm, dry Neuro: II - XII grossly intact.   Psych: depressed mood and affect.  Appropriate judgement and reason   Data Reviewed: I have personally reviewed following labs and imaging studies  CBC: Recent Labs  Lab 05/30/20 1153 06/02/20 0451 06/03/20 0559 06/04/20 0532 06/05/20 0449  WBC  --  16.0* 11.5* 13.5*  10.3  NEUTROABS  --  13.6*  --   --   --   HGB 9.5* 8.9* 8.5* 8.7* 8.5*  HCT 28.0* 30.2* 27.4* 27.9* 27.2*  MCV  --  95.3 94.2 92.1 90.7  PLT  --  441* 358 357 016   Basic Metabolic Panel: Recent Labs  Lab 06/02/20 0451 06/02/20 1738 06/03/20 0559 06/04/20 0532 06/05/20 0449  NA 142 142 143 142 138  K 2.7* 5.2* 4.5 4.5 4.2  CL 102 109 110 107 99  CO2 _0 GLUCOSE 127* 159* 139* 103* 113*  BUN _1 25* 25*  CREATININE 0.61 0.60 0.65 0.68 0.72  CALCIUM 9.6 9.2 9.5 9.8 10.2  MG 1.6*  --  2.1 2.1 1.9    GFR: Estimated Creatinine Clearance: 41.4 mL/min (by C-G formula based on SCr of 0.72 mg/dL). Liver Function Tests: No results for input(s): AST, ALT, ALKPHOS, BILITOT, PROT, ALBUMIN in the last 168 hours. No results for input(s): LIPASE, AMYLASE in the last 168 hours. No results for input(s): AMMONIA in the last 168 hours. Coagulation Profile: Recent Labs  Lab 06/02/20 0451  INR 1.2   Cardiac Enzymes: No results for input(s): CKTOTAL, CKMB, CKMBINDEX, TROPONINI in the last 168 hours. BNP (last 3 results) No results for input(s): PROBNP in the last 8760 hours. HbA1C: No results for input(s): HGBA1C in the last 72 hours. CBG: No results for input(s): GLUCAP in the last 168 hours. Lipid Profile: No results for input(s): CHOL, HDL, LDLCALC, TRIG, CHOLHDL, LDLDIRECT in the last 72 hours. Thyroid Function Tests: No results for input(s): TSH, T4TOTAL, FREET4, T3FREE, THYROIDAB in the last 72 hours. Anemia Panel: No results for input(s): VITAMINB12, FOLATE, FERRITIN, TIBC, IRON, RETICCTPCT in the last 72 hours. Sepsis Labs: Recent Labs  Lab 06/02/20 0451  PROCALCITON <0.10  LATICACIDVEN 1.9    Recent Results (from the past 240 hour(s))  SARS CORONAVIRUS 2 (TAT 6-24 HRS) Nasopharyngeal Nasopharyngeal Swab     Status: None   Collection Time: 05/28/20  3:28 PM   Specimen: Nasopharyngeal Swab  Result Value Ref Range Status   SARS Coronavirus 2 NEGATIVE NEGATIVE Final    Comment: (NOTE) SARS-CoV-2 target nucleic acids are NOT DETECTED.  The SARS-CoV-2 RNA is generally detectable in upper and lower respiratory specimens during the acute phase of infection. Negative results do not preclude SARS-CoV-2 infection, do not rule out co-infections with other pathogens, and should not be used as the sole basis for treatment or other patient management decisions. Negative results must be combined with clinical observations, patient history, and epidemiological information. The  expected result is Negative.  Fact Sheet for Patients: SugarRoll.be  Fact Sheet for Healthcare Providers: https://www.woods-mathews.com/  This test is not yet approved or cleared by the [place] FDA and  has been authorized for detection and/or diagnosis of SARS-CoV-2 by FDA under an Emergency Use Authorization (EUA). This EUA will remain  in effect (meaning this test can be used) for the duration of the COVID-19 declaration under Se ction 564(b)(1) of the Act, 21 U.S.C. section 360bbb-3(b)(1), unless the authorization is terminated or revoked sooner.  Performed at [facility], [place] [address], [place], [place] [address]   Culture, respiratory     Status: None (Preliminary result)   Collection Time: 05/30/20  2:53 PM   Specimen: Bronchial Wash; Respiratory  Result Value Ref Range Status   Specimen Description   Final    BRONCHIAL WASHINGS Performed at [facility],  [place], [place] [address]    Special Requests   Final    NONE Performed at [facility], [place] [address] [place], [place] [address]    Gram Stain   Final    MODERATE WBC PRESENT, PREDOMINANTLY PMN MODERATE GRAM NEGATIVE RODS    Culture   Final    MODERATE PSEUDOMONAS AERUGINOSA Sent to [facility] for further susceptibility testing. Performed at [facility], [place] [address] [place], [place] [address]    Report Status PENDING  Incomplete  Acid Fast Smear (AFB)     Status: None   Collection Time: 05/30/20  2:53 PM   Specimen: Bronchoalveolar Lavage; Respiratory  Result Value Ref Range Status   AFB Specimen Processing Concentration  Final   Acid Fast Smear Negative  Final    Comment: (NOTE) Performed At: [facility] [place], [place] [id number] Rush Farmer MD CW:[id number]    Source (AFB) BRONCHIAL ALVEOLAR LAVAGE  Final    Comment: Performed at [facility], [place]  [place], [place] [address]  KOH prep     Status: None   Collection Time: 05/30/20  2:53 PM   Specimen: Bronchoalveolar Lavage  Result Value Ref Range Status   Specimen Description BRONCHIAL ALVEOLAR LAVAGE  Final   Special Requests NONE  Final   KOH Prep   Final    NO YEAST OR FUNGAL ELEMENTS SEEN Performed at [facility] [place], [address] [place], [place] [address]    Report Status 05/30/2020 FINAL  Final  Aspergillus Ag, BAL/Serum     Status: None   Collection Time: 05/30/20  2:53 PM   Specimen: Bronchial Alveolar Lavage  Result Value Ref Range Status   Aspergillus Ag, BAL/Serum 0.05 0.00 - 0.49 Index Final    Comment: (NOTE) Performed At: [facility] [address] [place], [place] [id number] Rush Farmer MD EV:[id number]   Anaerobic culture     Status: None   Collection Time: 05/30/20  2:53 PM   Specimen: Bronchial Wash; Lung  Result Value Ref Range Status   Specimen Description   Final    BRONCHIAL WASHINGS Performed at [facility], [address], [place], [place] [address]    Special Requests   Final    NONE Performed at [facility], [address], [place], [place] [address]    Culture   Final    NO ANAEROBES ISOLATED Performed at [facility], [place] [address] [place], [place] [address]    Report Status 06/05/2020 FINAL  Final  Fungus Culture With Stain     Status: None (Preliminary result)   Collection Time: 05/30/20  2:53 PM  Result Value Ref Range Status   Fungus Stain Final report  Final    Comment: (NOTE) Performed At: [facility] [place], [place] [id number] Rush Farmer MD [id number]    Fungus (Mycology) Culture PENDING  Incomplete   Fungal Source BRONCHIAL ALVEOLAR LAVAGE  Final    Comment: Performed at [facility], [place] [address], [place],  [address]  Fungus Culture Result     Status: None   Collection Time: 05/30/20  2:53 PM  Result Value Ref Range Status    Result 1 Comment  Final    Comment: (NOTE) KOH/Calcofluor preparation:  no fungus observed. Performed At: [facility] [place], [place] [id number] Rush Farmer MD PJ:[id number]   Blood culture (routine x 2)  Status: None (Preliminary result)   Collection Time: 06/02/20  4:51 AM   Specimen: BLOOD  Result Value Ref Range Status   Specimen Description BLOOD RIGHT ANTECUBITAL  Final   Special Requests   Final    BOTTLES DRAWN AEROBIC AND ANAEROBIC Blood Culture adequate volume   Culture   Final    NO GROWTH 3 DAYS Performed at [facility], [address] [place], [place] [address]    Report Status PENDING  Incomplete  Blood culture (routine x 2)     Status: None (Preliminary result)   Collection Time: 06/02/20  4:51 AM   Specimen: BLOOD  Result Value Ref Range Status   Specimen Description BLOOD BLOOD RIGHT FOREARM  Final   Special Requests   Final    BOTTLES DRAWN AEROBIC AND ANAEROBIC Blood Culture results may not be optimal due to an excessive volume of blood received in culture bottles   Culture   Final    NO GROWTH 3 DAYS Performed at [facility], [address] [place] [address], [place], [place] [address]    Report Status PENDING  Incomplete  Resp Panel by RT-PCR (Flu A&B, Covid) Nasopharyngeal Swab     Status: None   Collection Time: 06/02/20  4:51 AM   Specimen: Nasopharyngeal Swab; Nasopharyngeal(NP) swabs in vial transport medium  Result Value Ref Range Status   SARS Coronavirus 2 by RT PCR NEGATIVE NEGATIVE Final    Comment: (NOTE) SARS-CoV-2 target nucleic acids are NOT DETECTED.  The SARS-CoV-2 RNA is generally detectable in upper respiratory specimens during the acute phase of infection. The lowest concentration of SARS-CoV-2 viral copies this assay can detect is 138 copies/mL. A negative result does not preclude SARS-Cov-2 infection and should not be used as the sole basis for treatment or other patient management decisions. A  negative result may occur with  improper specimen collection/handling, submission of specimen other than nasopharyngeal swab, presence of viral mutation(s) within the areas targeted by this assay, and inadequate number of viral copies(<138 copies/mL). A negative result must be combined with clinical observations, patient history, and epidemiological information. The expected result is Negative.  Fact Sheet for Patients:  EntrepreneurPulse.com.au  Fact Sheet for Healthcare Providers:  IncredibleEmployment.be  This test is no t yet approved or cleared by the [place] FDA and  has been authorized for detection and/or diagnosis of SARS-CoV-2 by FDA under an Emergency Use Authorization (EUA). This EUA will remain  in effect (meaning this test can be used) for the duration of the COVID-19 declaration under Section 564(b)(1) of the Act, 21 U.S.C.section 360bbb-3(b)(1), unless the authorization is terminated  or revoked sooner.       Influenza A by PCR NEGATIVE NEGATIVE Final   Influenza B by PCR NEGATIVE NEGATIVE Final    Comment: (NOTE) The Xpert Xpress SARS-CoV-2/FLU/RSV plus assay is intended as an aid in the diagnosis of influenza from Nasopharyngeal swab specimens and should not be used as a sole basis for treatment. Nasal washings and aspirates are unacceptable for Xpert Xpress SARS-CoV-2/FLU/RSV testing.  Fact Sheet for Patients: EntrepreneurPulse.com.au  Fact Sheet for Healthcare Providers: IncredibleEmployment.be  This test is not yet approved or cleared by the [place] FDA and has been authorized for detection and/or diagnosis of SARS-CoV-2 by FDA under an Emergency Use Authorization (EUA). This EUA will remain in effect (meaning this test can be used) for the duration of the COVID-19 declaration under Section 564(b)(1) of the Act, 21 U.S.C. section 360bbb-3(b)(1), unless the authorization  is  terminated or revoked.  Performed at [facility], [address], [place], [place] [address]       Radiology Studies: MR BRAIN W WO CONTRAST  Result Date: 06/05/2020 CLINICAL DATA:  Small-cell lung cancer, staging EXAM: MRI HEAD WITHOUT AND WITH CONTRAST TECHNIQUE: Multiplanar, multiecho pulse sequences of the brain and surrounding structures were obtained without and with intravenous contrast. CONTRAST:  72m GADAVIST GADOBUTROL 1 MMOL/ML IV SOLN COMPARISON:  May 2019 FINDINGS: Motion artifact is present. Findings below are within this limitation. Brain: There is no acute infarction or intracranial hemorrhage. There is no intracranial mass, mass effect, or edema. There is no hydrocephalus or extra-axial fluid collection. Prominence of the ventricles and sulci reflects generalized parenchymal volume loss. Small chronic infarcts of the right frontal lobe cortex and caudate. Additional patchy and confluent areas of T2 hyperintensity in the supratentorial and pontine white matter are nonspecific but probably reflect moderate chronic microvascular ischemic changes. Appearance is similar to the prior study. No abnormal enhancement. Vascular: Major vessel flow voids at the skull base are preserved. Skull and upper cervical spine: Normal marrow signal is preserved. Sinuses/Orbits: Paranasal sinuses are aerated. Orbits are unremarkable. Other: Sella is unremarkable.  Mastoid air cells are clear. IMPRESSION: Suboptimal evaluation due to motion artifact. No evidence of intracranial metastatic disease. Small chronic infarcts and moderate chronic microvascular ischemic changes. Electronically Signed   By: PMacy MisM.D.   On: 06/05/2020 11:39   DG Chest Port 1 View  Result Date: 06/05/2020 CLINICAL DATA:  Shortness of breath EXAM: PORTABLE CHEST 1 VIEW COMPARISON:  06/02/2020 FINDINGS: Mild blunting of the left costophrenic angle is unchanged. Mild chronic bronchitic type changes. Calcific  aortic atherosclerosis. There is left retrocardiac consolidation/atelectasis, unchanged. IMPRESSION: Unchanged left retrocardiac consolidation/atelectasis. Electronically Signed   By: KUlyses JarredM.D.   On: 06/05/2020 02:30     Scheduled Meds: . apixaban  5 mg Oral BID  . escitalopram  20 mg Oral Daily  . fluticasone furoate-vilanterol  1 puff Inhalation Daily  . furosemide  20 mg Intravenous Daily  . influenza vaccine adjuvanted  0.5 mL Intramuscular Tomorrow-1000  . ipratropium-albuterol  3 mL Nebulization TID  . levofloxacin  750 mg Oral Daily  . levothyroxine  38 mcg Intravenous Daily  . montelukast  10 mg Oral BH-q7a  . predniSONE  50 mg Oral Q breakfast  . roflumilast  250 mcg Oral Daily  . simvastatin  40 mg Oral QPM  . sodium chloride flush  3 mL Intravenous Q12H  . umeclidinium bromide  1 puff Inhalation Daily   Continuous Infusions: . sodium chloride 250 mL (06/04/20 0619)     LOS: 4 days     TEnzo Bi MD [facility] If 7PM-7AM, please contact night-coverage 06/06/2020, 5:25 AM

## 2020-06-04 DIAGNOSIS — J441 Chronic obstructive pulmonary disease with (acute) exacerbation: Secondary | ICD-10-CM | POA: Diagnosis not present

## 2020-06-04 DIAGNOSIS — E876 Hypokalemia: Secondary | ICD-10-CM | POA: Diagnosis not present

## 2020-06-04 DIAGNOSIS — Z87891 Personal history of nicotine dependence: Secondary | ICD-10-CM

## 2020-06-04 DIAGNOSIS — I82409 Acute embolism and thrombosis of unspecified deep veins of unspecified lower extremity: Secondary | ICD-10-CM

## 2020-06-04 DIAGNOSIS — J440 Chronic obstructive pulmonary disease with acute lower respiratory infection: Secondary | ICD-10-CM

## 2020-06-04 DIAGNOSIS — J209 Acute bronchitis, unspecified: Secondary | ICD-10-CM | POA: Diagnosis not present

## 2020-06-04 DIAGNOSIS — J9622 Acute and chronic respiratory failure with hypercapnia: Secondary | ICD-10-CM | POA: Diagnosis not present

## 2020-06-04 DIAGNOSIS — J9621 Acute and chronic respiratory failure with hypoxia: Secondary | ICD-10-CM | POA: Diagnosis not present

## 2020-06-04 DIAGNOSIS — C3411 Malignant neoplasm of upper lobe, right bronchus or lung: Secondary | ICD-10-CM

## 2020-06-04 LAB — CBC
HCT: 27.9 % — ABNORMAL LOW (ref 36.0–46.0)
Hemoglobin: 8.7 g/dL — ABNORMAL LOW (ref 12.0–15.0)
MCH: 28.7 pg (ref 26.0–34.0)
MCHC: 31.2 g/dL (ref 30.0–36.0)
MCV: 92.1 fL (ref 80.0–100.0)
Platelets: 357 10*3/uL (ref 150–400)
RBC: 3.03 MIL/uL — ABNORMAL LOW (ref 3.87–5.11)
RDW: 15 % (ref 11.5–15.5)
WBC: 13.5 10*3/uL — ABNORMAL HIGH (ref 4.0–10.5)
nRBC: 0 % (ref 0.0–0.2)

## 2020-06-04 LAB — BASIC METABOLIC PANEL
Anion gap: 11 (ref 5–15)
BUN: 25 mg/dL — ABNORMAL HIGH (ref 8–23)
CO2: 24 mmol/L (ref 22–32)
Calcium: 9.8 mg/dL (ref 8.9–10.3)
Chloride: 107 mmol/L (ref 98–111)
Creatinine, Ser: 0.68 mg/dL (ref 0.44–1.00)
GFR, Estimated: >60 mL/min (ref >60)
Glucose, Bld: 103 mg/dL — ABNORMAL HIGH (ref 70–99)
Potassium: 4.5 mmol/L (ref 3.5–5.1)
Sodium: 142 mmol/L (ref 135–145)

## 2020-06-04 LAB — MAGNESIUM: Magnesium: 2.1 mg/dL (ref 1.7–2.4)

## 2020-06-04 MED ORDER — FUROSEMIDE 10 MG/ML IJ SOLN
20.0000 mg | Freq: Every day | INTRAMUSCULAR | Status: DC
  Administered 2020-06-04 – 2020-06-10 (×7): 20 mg via INTRAVENOUS
  Filled 2020-06-04: qty 4
  Filled 2020-06-04 (×2): qty 2
  Filled 2020-06-04: qty 4
  Filled 2020-06-04 (×3): qty 2

## 2020-06-04 MED ORDER — INFLUENZA VAC A&B SA ADJ QUAD 0.5 ML IM PRSY
0.5000 mL | PREFILLED_SYRINGE | INTRAMUSCULAR | Status: DC
  Filled 2020-06-04 (×2): qty 0.5

## 2020-06-04 MED ORDER — PNEUMOCOCCAL VAC POLYVALENT 25 MCG/0.5ML IJ INJ
0.5000 mL | INJECTION | INTRAMUSCULAR | Status: AC
  Administered 2020-06-05: 0.5 mL via INTRAMUSCULAR
  Filled 2020-06-04: qty 0.5

## 2020-06-04 MED ORDER — METHYLPREDNISOLONE SODIUM SUCC 40 MG IJ SOLR
40.0000 mg | Freq: Two times a day (BID) | INTRAMUSCULAR | Status: DC
  Administered 2020-06-04 – 2020-06-05 (×2): 40 mg via INTRAVENOUS
  Filled 2020-06-04 (×2): qty 1

## 2020-06-04 MED ORDER — LEVOFLOXACIN 500 MG PO TABS
750.0000 mg | ORAL_TABLET | Freq: Every day | ORAL | Status: AC
  Administered 2020-06-04 – 2020-06-06 (×3): 750 mg via ORAL
  Filled 2020-06-04 (×3): qty 2

## 2020-06-04 NOTE — Consult Note (Signed)
Hematology/Oncology Consult note [facility] Telephone:([phone] [phone]  Patient Care Team: Chase Picket, MD as PCP - General (Family Medicine)   Name of the patient: Christine Morris  [id number]  1940/06/01    Reason for consult: New diagnosis of small cell lung cancer  Requesting physician Dr. 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  In June 2021 she had findings suggestive of atypical granulomatous and lamination as well as mediastinal/hilar adenopathy.  This was followed by a PET CT scan In August 2021 which showed hypermetabolic 2.7 cm subcarinal lymph node with an SUV of 13.3.  Left hilar lymph node with an SUV of 11.2 changes of chronic bronchiectasis.  Subsequent CT angio chest which was done again for shortness of breath on 05/03/2020 showed worsening subcarinal adenopathy with a conglomerate subcarinal mass measuring 4.2 cm as compared to 2 patient underwent bronchoscopy with biopsies and 10 L EBUS guided biopsy was consistent with small cell lung cancer.  .5 cm previously.  Worsening left hilar adenopathy.  Right inferior hilar adenopathy increased to 2.6 from 1.7 cm.  New right hilar lymph node as well as right paratracheal lymph node.  Spiculated nodule in the right upper lobe measuring 10 x 6 mm.  At baseline patient s on 3-5L of O2 at home. Lives with husband  ECOG PS- 2  Pain scale- 3   Review of systems- Review of Systems  Constitutional: Positive for malaise/fatigue. Negative for chills, fever and weight loss.  HENT: Negative for congestion, ear discharge and nosebleeds.   Eyes: Negative for blurred vision.  Respiratory: Positive for shortness of breath. Negative for cough, hemoptysis, sputum production and wheezing.   Cardiovascular: Negative for chest pain, palpitations, orthopnea  and claudication.  Gastrointestinal: Negative for abdominal pain, blood in stool, constipation, diarrhea, heartburn, melena, nausea and vomiting.  Genitourinary: Negative for dysuria, flank pain, frequency, hematuria and urgency.  Musculoskeletal: Negative for back pain, joint pain and myalgias.  Skin: Negative for rash.  Neurological: Negative for dizziness, tingling, focal weakness, seizures, weakness and headaches.  Endo/Heme/Allergies: Does not bruise/bleed easily.  Psychiatric/Behavioral: Negative for depression and suicidal ideas. The patient does not have insomnia.     Allergies  Allergen Reactions  . Rivaroxaban Hives    Patient Active Problem List   Diagnosis Date Noted  . COPD (chronic obstructive pulmonary disease) with acute bronchitis ([place]) 06/02/2020  . Acute on chronic respiratory failure ([place]) 06/02/2020  . Lung cancer ([place]) 06/02/2020  . COPD with acute exacerbation ([place]) 06/02/2020  . Syncope 12/16/2019  . AF (paroxysmal atrial fibrillation) ([place]) 12/16/2019  . COPD with chronic bronchitis ([place]) 12/16/2019  . Chronic respiratory failure with hypoxia ([place]) 12/16/2019  . Sepsis ([place]) 05/31/2018  . TIA (transient ischemic attack) 10/11/2017  . CVA (cerebral vascular accident) ([place]) 10/11/2017  . Anxiety and depression 05/08/2015     Past Medical History:  Diagnosis Date  . Afib ([place])   . Anxiety   . Atrial fibrillation ([place]) 2017  . Cervical stenosis of spine   . COPD (chronic obstructive pulmonary disease) ([place])   . Depression   . Hyperlipidemia   . Hypothyroidism   . Nodule of right lung   . On supplemental oxygen by nasal cannula   . 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 Rupture of extensor tendon of finger   . Spondylosis of  cervical region without myelopathy or radiculopathy   . Stroke ([place])   . Vertigo   . Wartenberg syndrome      Past Surgical History:  Procedure Laterality Date  . ABDOMINAL HYSTERECTOMY    . APPENDECTOMY    . BREAST  BIOPSY Left 1971  . BREAST CYST EXCISION Left 1971  . CATARACT SURGERY Bilateral   . LOOP RECORDER IMPLANT    . OOPHORECTOMY Bilateral   . VIDEO BRONCHOSCOPY WITH ENDOBRONCHIAL NAVIGATION N/A 05/30/2020   Procedure: VIDEO BRONCHOSCOPY WITH ENDOBRONCHIAL NAVIGATION;  Surgeon: Ottie Glazier, MD;  Location: [facility] ORS;  Service: Thoracic;  Laterality: N/A;  . VIDEO BRONCHOSCOPY WITH ENDOBRONCHIAL ULTRASOUND N/A 05/30/2020   Procedure: VIDEO BRONCHOSCOPY WITH ENDOBRONCHIAL ULTRASOUND;  Surgeon: Ottie Glazier, MD;  Location: [facility] ORS;  Service: Thoracic;  Laterality: N/A;    Social History   Socioeconomic History  . Marital status: Divorced    Spouse name: Not on file  . Number of children: Not on file  . Years of education: Not on file  . Highest education level: Not on file  Occupational History  . Not on file  Tobacco Use  . Smoking status: Former Research scientist (life sciences)  . Smokeless tobacco: Never Used  Vaping Use  . Vaping Use: Never used  Substance and Sexual Activity  . Alcohol use: Never  . Drug use: Never  . Sexual activity: Not on file  Other Topics Concern  . Not on file  Social History Narrative  . Not on file   Social Determinants of Health   Financial Resource Strain: Not on file  Food Insecurity: Not on file  Transportation Needs: Not on file  Physical Activity: Not on file  Stress: Not on file  Social Connections: Not on file  Intimate Partner Violence: Not on file     History reviewed. No pertinent family history.   Current Facility-Administered Medications:  .  0.9 %  sodium chloride infusion, 250 mL, Intravenous, PRN, Si Raider, Ailene Rud, MD, Last Rate: 10 mL/hr at 06/04/20 0619, 250 mL at 06/04/20 0619 .  albuterol (PROVENTIL) (2.5 MG/3ML) 0.083% nebulizer solution 2.5 mg, 2.5 mg, Nebulization, Q2H PRN, Wouk, Ailene Rud, MD .  apixaban [facility]) tablet 5 mg, 5 mg, Oral, BID, Rocky Morel, RPH, 5 mg at 06/04/20 1102 .  escitalopram (LEXAPRO) tablet 20 mg, 20 mg,  Oral, Daily, Wouk, Ailene Rud, MD, 20 mg at 06/04/20 1103 .  fluticasone furoate-vilanterol (BREO ELLIPTA) 200-25 MCG/INH 1 puff, 1 puff, Inhalation, Daily, Wouk, Ailene Rud, MD, 1 puff at 06/02/20 1438 .  furosemide (LASIX) injection 20 mg, 20 mg, Intravenous, Daily, Aleskerov, Fuad, MD .  ipratropium-albuterol (DUONEB) 0.5-2.5 (3) MG/3ML nebulizer solution 3 mL, 3 mL, Nebulization, Q6H, Wouk, Ailene Rud, MD, 3 mL at 06/04/20 0809 .  levofloxacin (LEVAQUIN) tablet 750 mg, 750 mg, Oral, Daily, Aleskerov, Fuad, MD .  levothyroxine (SYNTHROID, LEVOTHROID) injection 38 mcg, 38 mcg, Intravenous, Daily, Ouma, Bing Neighbors, NP, 38 mcg at 06/04/20 0910 .  LORazepam (ATIVAN) injection 1 mg, 1 mg, Intravenous, Q6H PRN, Wouk, Ailene Rud, MD, 1 mg at 06/04/20 [phone] .  methylPREDNISolone sodium succinate (SOLU-MEDROL) 40 mg/mL injection 40 mg, 40 mg, Intravenous, Q12H, Aleskerov, Fuad, MD .  montelukast (SINGULAIR) tablet 10 mg, 10 mg, Oral, BH-q7a, Wouk, Ailene Rud, MD, 10 mg at 06/04/20 [phone] .  roflumilast (DALIRESP) tablet 250 mcg, 250 mcg, Oral, Daily, Wouk, Ailene Rud, MD, 250 mcg at 06/04/20 1103 .  simvastatin (ZOCOR) tablet 40 mg, 40 mg, Oral, QPM, Wouk, [employer], MD .  sodium chloride flush (NS) 0.9 % injection 3 mL, 3 mL, Intravenous, Q12H, Wouk, Ailene Rud, MD, 3 mL at 06/04/20 1104 .  sodium chloride flush (NS) 0.9 % injection 3 mL, 3 mL, Intravenous, PRN, Wouk, Ailene Rud, MD .  umeclidinium bromide (INCRUSE ELLIPTA) 62.5 MCG/INH 1 puff, 1 puff, Inhalation, Daily, Wouk, Ailene Rud, MD, 1 puff at 06/04/20 1104   Physical exam:  Vitals:   06/04/20 0809 06/04/20 0829 06/04/20 1050 06/04/20 1200  BP:  (!) 159/89  (!) 148/80  Pulse:  (!) 107  93  Resp:  20  18  Temp:  97.9 F (36.6 C)  98 F (36.7 C)  TempSrc:  Oral  Oral  SpO2: 97% 98% 99% 97%  Weight:      Height:       Physical Exam Constitutional:      Comments: Frail elderly woman who appears fatigued   Eyes:     Extraocular Movements: EOM normal.  Cardiovascular:     Rate and Rhythm: Normal rate. Rhythm irregular.     Heart sounds: Normal heart sounds.  Pulmonary:     Comments: Effort increased. Breath sounds diminished b/l Abdominal:     General: Bowel sounds are normal.     Palpations: Abdomen is soft.  Skin:    General: Skin is warm and dry.  Neurological:     Mental Status: She is alert and oriented to person, place, and time.        CMP Latest Ref Rng & Units 06/04/2020  Glucose 70 - 99 mg/dL 103(H)  BUN 8 - 23 mg/dL 25(H)  Creatinine 0.44 - 1.00 mg/dL 0.68  Sodium 135 - 145 mmol/L 142  Potassium 3.5 - 5.1 mmol/L 4.5  Chloride 98 - 111 mmol/L 107  CO2 22 - 32 mmol/L 24  Calcium 8.9 - 10.3 mg/dL 9.8  Total Protein 6.5 - 8.1 g/dL -  Total Bilirubin 0.3 - 1.2 mg/dL -  Alkaline Phos 38 - 126 U/L -  AST 15 - 41 U/L -  ALT 0 - 44 U/L -   CBC Latest Ref Rng & Units 06/04/2020  WBC 4.0 - 10.5 K/uL 13.5(H)  Hemoglobin 12.0 - 15.0 g/dL 8.7(L)  Hematocrit 36.0 - 46.0 % 27.9(L)  Platelets 150 - 400 K/uL 357    @IMAGES @  DG Chest 1 View  Result Date: 06/02/2020 CLINICAL DATA:  Shortness of breath, right lobe biopsy performed Wednesday EXAM: CHEST  1 VIEW COMPARISON:  Radiograph 12/16/2019, CT 05/30/2020 FINDINGS: There is persistent atelectasis and/or consolidation in the left lung base with a left pleural effusion as was seen on comparison CT imaging 3 days prior. Some additional bronchitic and bronchiectatic changes are seen diffusely throughout the lungs with some chronically coarsened interstitial changes and reticulonodular opacities throughout the lungs in a similar distribution to more remote comparison. No new consolidative opacity. No pneumothorax. No convincing CT features of edema at this time. The aorta is calcified. The remaining cardiomediastinal contours are unremarkable. No acute osseous or soft tissue abnormality. Degenerative changes are present in the imaged  spine and shoulders. Telemetry leads and support devices overlie the chest. IMPRESSION: 1. Persistent atelectasis and/or consolidation in the left lung base with a left pleural effusion as was seen on comparison CT imaging. 2. Some chronic bronchitic/bronchiectatic changes with coarsened interstitium and diffuse reticulonodular opacities conspicuous for a long-standing/chronic atypical infection including mycobacterial etiologies. 3. No other acute cardiopulmonary abnormalities. 4. The aorta is calcified. The remaining cardiomediastinal contours  are unremarkable. Electronically Signed   By: Lovena Le M.D.   On: 06/02/2020 05:39   CT CHEST WO CONTRAST  Result Date: 05/30/2020 CLINICAL DATA:  Shortness of breath.  Preop for bronchoscopy. EXAM: CT CHEST WITHOUT CONTRAST TECHNIQUE: Multidetector CT imaging of the chest was performed following the standard protocol without IV contrast. COMPARISON:  May 03, 2020. FINDINGS: Cardiovascular: Atherosclerosis of thoracic aorta is noted without aneurysm formation. Normal cardiac size. Small pericardial effusion is noted. Coronary artery calcifications are noted. Mediastinum/Nodes: As noted on prior exam, there is extensive mediastinal adenopathy present particularly in the right paratracheal pretracheal subcarinal and left hilar regions. This results in narrowing of the left mainstem bronchus. Thyroid gland is unremarkable. Esophagus is unremarkable. Lungs/Pleura: No pneumothorax is noted. Mild biapical scarring is noted. Mild to moderate left pleural effusion is noted with adjacent left lower lobe postobstructive atelectasis or infiltrate. Upper Abdomen: No acute abnormality. Musculoskeletal: No chest wall mass or suspicious bone lesions identified. IMPRESSION: 1. Mild to moderate left pleural effusion is noted with adjacent left lower lobe postobstructive atelectasis or infiltrate. 2. Small pericardial effusion is noted. 3. Coronary artery calcifications are noted  suggesting coronary artery disease. 4. As noted on prior exam, there is extensive mediastinal adenopathy present particularly in the right paratracheal, subcarinal and left hilar regions. This results in narrowing of the left mainstem bronchus. This is concerning for malignancy or metastatic disease. Aortic Atherosclerosis (ICD10-I70.0). Electronically Signed   By: Marijo Conception M.D.   On: 05/30/2020 09:32   US Venous Img Lower Bilateral (DVT)  Result Date: 06/03/2020 CLINICAL DATA:  Shortness of breath EXAM: BILATERAL LOWER EXTREMITY VENOUS DOPPLER ULTRASOUND TECHNIQUE: Gray-scale sonography with compression, as well as color and duplex ultrasound, were performed to evaluate the deep venous system(s) from the level of the common femoral vein through the popliteal and proximal calf veins. COMPARISON:  None. FINDINGS: VENOUS Normal compressibility of the common femoral, superficial femoral, and popliteal veins, as well as the visualized calf veins. Visualized portions of profunda femoral vein and great saphenous vein unremarkable. No filling defects to suggest DVT on grayscale or color Doppler imaging. Doppler waveforms show normal direction of venous flow, normal respiratory plasticity and response to augmentation. Limited views of the contralateral common femoral vein are unremarkable. OTHER None. Limitations: none IMPRESSION: No acute deep vein thrombosis in the visualized bilateral lower extremities. Electronically Signed   By: Valentino Saxon MD   On: 06/03/2020 13:20    Assessment and plan- Patient is a 80 y.o. female with newly diagnosed small cell lung cancer  - Discussed with patient the CT chest results which I have reviewed independently. Discussed biopsy results. - She atleast has stage III disease but will need outpatient PET to complete staging work up - will order mri brain inpatient to r/o metastatic disease - if no evidence of distant metastatic disease, patient would benefit from  chemo/RT. However given that her PS is 2 at best, I would recommend sequential treatment. - I will discuss her care with rad onc at tumor board before deciding about treatment plan. - will also arrange for outpatient palliative care consult   Visit Diagnosis 1. COPD exacerbation ([place])   2. Acute on chronic respiratory failure with hypoxia and hypercapnia (HCC)   3. Hypokalemia   4. Hypomagnesemia   5. Sepsis, due to unspecified organism, unspecified whether acute organ dysfunction present ([place])   6. DVT (deep venous thrombosis) ([place])   7. Abnormal CXR     Dr.  Randa Evens, MD, MPH [place] at [facility] [phone] 06/04/2020

## 2020-06-04 NOTE — Evaluation (Signed)
Occupational Therapy Evaluation Patient Details Name: Christine Morris MRN: [id number] DOB: December 25, 1940 Today's Date: 06/04/2020    History of Present Illness Pt is a 80 y/o F with PMH: chronic hypoxic RF, advanced COPD (on 4-5L at baseline), bronchiectasis, Afib on Eliquis, and  known b/l small pleural effusions (s/p recent lung biopsy on Weds 1/5) who presents via EMS from home for assessment of acute onset of shortness of breath associate with increase in cough. Current cheif c/o: Mediastinal mass with LLL infiltrate. Pulminology following.   Clinical Impression   Pt seen for OT evaluation in setting of acute hospitalization d/t increased SOB and cough. Pt reports being MOD I for fxl mobility and basic self care at baseline (ex: performs "bird baths" d/t low tolerance for shower steam). Pt lives in mobile home with her spouse with 4 STE. Pt presents this date with decreased strength and fxl activity tolerance. Pt able to perform seated UB ADLs with SETUP as she is unable to gather the ADL items herself 2/2 low tolerance and is currently unable to tolerate performing UB self care in standing for the same reason. Pt requires SETUP for seated LB ADLs and MIN A For standing with UE support. Pt requires CGA/MIN A with ADL transfers and benefits from at least unilateral support in standing, primarily d/t low fxl activity tolerance. OT will continue to follow acutely and anticipate pt will benefit from [facility] f/u upon d/c. In addition, should pt qualify, she could potentially benefit from f/u or consultation with pulmonary rehab.     Follow Up Recommendations  Home health OT;Supervision - Intermittent    Equipment Recommendations  3 in 1 bedside commode;Tub/shower seat;Other (comment) (2ww)    Recommendations for Other Services       Precautions / Restrictions Precautions Precautions: Fall Restrictions Weight Bearing Restrictions: No      Mobility Bed Mobility Overal bed mobility: Modified  Independent             General bed mobility comments: extended time, HOB elevated    Transfers Overall transfer level: Needs assistance Equipment used: 1 person hand held assist Transfers: Sit to/from Stand Sit to Stand: Min guard;Min assist              Balance Overall balance assessment: Needs assistance Sitting-balance support: Feet supported Sitting balance-Leahy Scale: Good       Standing balance-Leahy Scale: Fair Standing balance comment: requires UE support at least unilaterally to sustain static stand, primarily d/t decreased standing tolerance.                           ADL either performed or assessed with clinical judgement   ADL                                         General ADL Comments: Pt requires SETUP to perform seated UB ADLs d/t decreased tolerance for fxl mobility and standing to complete standing self care or gather ADL items. In addition, able to perform LB ADLs seated with SETUP and requires CGA/MIN A for standing LB ADLs d/t decreased tolerance for more dynamic standing tasks.     Vision Patient Visual Report: No change from baseline       Perception     Praxis      Pertinent Vitals/Pain Pain Assessment: No/denies pain     Hand Dominance  Right   Extremity/Trunk Assessment Upper Extremity Assessment Upper Extremity Assessment: Overall WFL for tasks assessed;Generalized weakness (ROm WFL, MMT grossly 4-/5)   Lower Extremity Assessment Lower Extremity Assessment: Defer to PT evaluation;Generalized weakness       Communication Communication Communication: No difficulties   Cognition Arousal/Alertness: Awake/alert Behavior During Therapy: WFL for tasks assessed/performed Overall Cognitive Status: Within Functional Limits for tasks assessed                                     General Comments       Exercises Other Exercises Other Exercises: OT facilitates ed re: role of OT in  acute setting, safety considerations such as use of call light, and postural extension exercises to improve surface area for breathing. Pt with good understanding and demos comprehension.   Shoulder Instructions      Home Living Family/patient expects to be discharged to:: Private residence Living Arrangements: Spouse/significant other Available Help at Discharge: Family;Available 24 hours/day Type of Home: Mobile home Home Access: Stairs to enter Entrance Stairs-Number of Steps: 4 Entrance Stairs-Rails: Can reach both Home Layout: One level     Bathroom Shower/Tub: Occupational psychologist: Standard Bathroom Accessibility: Yes   Home Equipment: Environmental consultant - 4 wheels;Shower seat   Additional Comments: on 4-5Lnc chronically      Prior Functioning/Environment Level of Independence: Independent with assistive device(s)        Comments: MOD I with rollator, assist from partner for IADLs. Able to perform BADLs with INDEP/MOD I. On 4L-5Lnc chroniclly (long tube)        OT Problem List: Decreased strength;Decreased activity tolerance;Cardiopulmonary status limiting activity      OT Treatment/Interventions: Self-care/ADL training;DME and/or AE instruction;Therapeutic activities;Therapeutic exercise;Energy conservation;Patient/family education    OT Goals(Current goals can be found in the care plan section) Acute Rehab OT Goals Patient Stated Goal: to go home OT Goal Formulation: With patient Time For Goal Achievement: 06/18/20 Potential to Achieve Goals: Good ADL Goals Pt Will Transfer to Toilet: with supervision;ambulating (with RW to at least [facility] ~10-15' away to improve tolerance for HH distances) Pt Will Perform Toileting - Clothing Manipulation and hygiene: sit to/from stand;with modified independence (with good pacing for [facility]) Pt/caregiver will Perform Home Exercise Program: Increased strength;Both right and left upper extremity;With Supervision Additional ADL Goal  #1: Pt will verbalize 3 EC strategies to apply to ADLs with no verbal cues.  OT Frequency: Min 1X/week   Barriers to D/C:            Co-evaluation              AM-PAC OT "6 Clicks" Daily Activity     Outcome Measure Help from another person eating meals?: None Help from another person taking care of personal grooming?: A Little Help from another person toileting, which includes using toliet, bedpan, or urinal?: A Little Help from another person bathing (including washing, rinsing, drying)?: A Little Help from another person to put on and taking off regular upper body clothing?: A Little Help from another person to put on and taking off regular lower body clothing?: A Little 6 Click Score: 19   End of Session Equipment Utilized During Treatment: Gait belt;Rolling walker Nurse Communication: Mobility status  Activity Tolerance: Patient tolerated treatment well;Other (comment) (limited by O2 needs) Patient left: in bed;with call bell/phone within reach;with bed alarm set  OT Visit Diagnosis: Unsteadiness on feet (  R26.81);Muscle weakness (generalized) (M62.81)                Time: 7494-4967 OT Time Calculation (min): 37 min Charges:  OT General Charges $OT Visit: 1 Visit OT Evaluation $OT Eval Moderate Complexity: 1 Mod OT Treatments $Self Care/Home Management : 8-22 mins $Therapeutic Activity: 8-22 mins  Gerrianne Scale, MS, OTR/L ascom [phone] 06/04/20, 6:19 PM

## 2020-06-04 NOTE — Progress Notes (Signed)
Patient states she uses chronic oxygen at 3-5L at home.  She is currently on [facility] in hospital and states her breathing has improved since admission.

## 2020-06-04 NOTE — Progress Notes (Signed)
Pulmonary Medicine          Date: 06/04/2020,   MRN# [id number] Christine Morris Mar 29, 1941     AdmissionWeight: 44.9 kg                 CurrentWeight: 46.1 kg      CHIEF COMPLAINT:   Mediastinal mass with LLL infiltrate   HISTORY OF PRESENT ILLNESS   Pleasant 80 year old female who was previously seen for worsening dyspnea and advanced COPD with chronic hypoxemia. Patient was last seen in June 2020. She is currently using oxygen 24 hours a day at 3 to 4 L/min. Previously she was on Incruse Ellipta as well as [employer], we had initiated DuoNeb nebulizer therapy as well as Roflumilast 250 mcg daily. Patient states she had initiated this therapy and feels slightly improved.   She continues to make thick tenacious phlegm however reports no problems expectorating it. Today we had discussed bronchopulmonary hygiene techniques including incentive spirometer to combat atelectasis and patient will have one ordered.   She also has bronchiectasis which is COPD related and remains high risk for pneumonia and hospitalization as in January 2020. We had also discussed using low-dose Zithromax thrice weekly for chronic suppressive therapy and patient is agreeable to this plan. I had encouraged patient to participate in graduated exercise program/pulmonary rehab. She denies constitutional symptoms.  08/02/19- O2 concentrator stopped working and she was able to notify [employer], they were kind enough to replace it with a new device on Monday.   12/12/19- patient states she had episodes of epistaxis, ironically she reports using flonase and this somehow has helped her.  Patient had CT chest 10/27/19 we reviewed this together, there is findings suggestive of atypical granulomatous infection such as MAC as well as med/hilar lymphadenopathy suggestive of possible neoplasm, we will obtian PET as recommended.   She stopped using her Inentive spirometer but we have encouraged her to use it more and  she is doing that.   02/14/20- patient was hospitalized for respiratory distress and syncope, she was thought to have possible arrythmia and was monitored with telemetry but subsequently improved and d/cd. She had findings of low BP. She reports staying well hydrated drinking water several times daily and also drinking pepsi. She has good appetite and is eating as well as using ensure shakes to supplement for nourishment but despite all this she has lost 3 more lbs. She hat PET scan we discussed in detail regardign RLL nodule and hilar lymphadenopathy, patient states she feels she will not be able to undergo biopsy or therapy for lung ca and wishes to wait for now and think about it with husband. In the interim we discussed modalities to remove phlegm such as addition of mucomyst to nebulizer regimen. She is to continue TOBI for bronchiectasis  Patient is here today for biopsy of mediastinal mass and airway inspection of lLL infiltrate with post obstructive process.    06/03/20- patient improved clinically she is now on 30Fio2 -BIPAP 12/6.  I have ordered ddimer to rule out dvt/pe and ordered US dvt LE study for today.  If negative on both will not order CTPE protocol since patient just had CT done to reduce radiation load as she does have oncology therapy planned.  Will obtain VBG and remove BIPAP today if able.  Reviewed care plan with patient and husband.  Secure message sent to respiratory therapist.    06/04/20- patient resting in bed, husband at bedside. She is on  2L [place], rhonchi bilaterally on auscultation.  We are in D/C planning stages of hospitalization. Weaning steroids today from 60 bid IV solumedrol to 40 with plan to transition to PO pred.  Antibiotics to PO levoquin 750 po daily for 3 more days to finish on outpatient.  Lasix 20 today for residual mild effusion and congestion on auscultation.  PT and OT today to expedite dc plan. Dicussed care plan with primary attending physician Dr Billie Ruddy.    PAST MEDICAL HISTORY   Past Medical History:  Diagnosis Date  . Afib ([place])   . Anxiety   . Atrial fibrillation ([place]) 2017  . Cervical stenosis of spine   . COPD (chronic obstructive pulmonary disease) ([place])   . Depression   . Hyperlipidemia   . Hypothyroidism   . Nodule of right lung   . On supplemental oxygen by nasal cannula   . 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 Rupture of extensor tendon of finger   . Spondylosis of cervical region without myelopathy or radiculopathy   . Stroke ([place])   . Vertigo   . Wartenberg syndrome      SURGICAL HISTORY   Past Surgical History:  Procedure Laterality Date  . ABDOMINAL HYSTERECTOMY    . APPENDECTOMY    . BREAST BIOPSY Left 1971  . BREAST CYST EXCISION Left 1971  . CATARACT SURGERY Bilateral   . LOOP RECORDER IMPLANT    . OOPHORECTOMY Bilateral   . VIDEO BRONCHOSCOPY WITH ENDOBRONCHIAL NAVIGATION N/A 05/30/2020   Procedure: VIDEO BRONCHOSCOPY WITH ENDOBRONCHIAL NAVIGATION;  Surgeon: Ottie Glazier, MD;  Location: [facility] ORS;  Service: Thoracic;  Laterality: N/A;  . VIDEO BRONCHOSCOPY WITH ENDOBRONCHIAL ULTRASOUND N/A 05/30/2020   Procedure: VIDEO BRONCHOSCOPY WITH ENDOBRONCHIAL ULTRASOUND;  Surgeon: Ottie Glazier, MD;  Location: [facility] ORS;  Service: Thoracic;  Laterality: N/A;     FAMILY HISTORY   History reviewed. No pertinent family history.   SOCIAL HISTORY   Social History   Tobacco Use  . Smoking status: Former Research scientist (life sciences)  . Smokeless tobacco: Never Used  Vaping Use  . Vaping Use: Never used  Substance Use Topics  . Alcohol use: Never  . Drug use: Never     MEDICATIONS    Home Medication:    Current Medication:  Current Facility-Administered Medications:  .  0.9 %  sodium chloride infusion, 250 mL, Intravenous, PRN, Si Raider, Ailene Rud, MD, Last Rate: 10 mL/hr at 06/04/20 0619, 250 mL at 06/04/20 0619 .  albuterol (PROVENTIL) (2.5 MG/3ML) 0.083% nebulizer solution 2.5 mg, 2.5 mg, Nebulization, Q2H PRN,  Wouk, Ailene Rud, MD .  apixaban [facility]) tablet 5 mg, 5 mg, Oral, BID, Rocky Morel, RPH, 5 mg at 06/04/20 1102 .  escitalopram (LEXAPRO) tablet 20 mg, 20 mg, Oral, Daily, Wouk, Ailene Rud, MD, 20 mg at 06/04/20 1103 .  fluticasone furoate-vilanterol (BREO ELLIPTA) 200-25 MCG/INH 1 puff, 1 puff, Inhalation, Daily, Wouk, Ailene Rud, MD, 1 puff at 06/02/20 1438 .  furosemide (LASIX) injection 20 mg, 20 mg, Intravenous, Daily, Pelham Hennick, MD .  ipratropium-albuterol (DUONEB) 0.5-2.5 (3) MG/3ML nebulizer solution 3 mL, 3 mL, Nebulization, Q6H, Wouk, Ailene Rud, MD, 3 mL at 06/04/20 0809 .  levofloxacin (LEVAQUIN) tablet 750 mg, 750 mg, Oral, Daily, Breonia Kirstein, MD .  levothyroxine (SYNTHROID, LEVOTHROID) injection 38 mcg, 38 mcg, Intravenous, Daily, Ouma, Bing Neighbors, NP, 38 mcg at 06/04/20 0910 .  LORazepam (ATIVAN) injection 1 mg, 1 mg, Intravenous, Q6H PRN, Wouk, Ailene Rud, MD, 1 mg  at 06/04/20 0611 .  methylPREDNISolone sodium succinate (SOLU-MEDROL) 40 mg/mL injection 40 mg, 40 mg, Intravenous, Q12H, Adrine Hayworth, MD .  montelukast (SINGULAIR) tablet 10 mg, 10 mg, Oral, BH-q7a, Wouk, Ailene Rud, MD, 10 mg at 06/04/20 [phone] .  roflumilast (DALIRESP) tablet 250 mcg, 250 mcg, Oral, Daily, Wouk, Ailene Rud, MD, 250 mcg at 06/04/20 1103 .  simvastatin (ZOCOR) tablet 40 mg, 40 mg, Oral, QPM, Wouk, Ailene Rud, MD .  sodium chloride flush (NS) 0.9 % injection 3 mL, 3 mL, Intravenous, Q12H, Wouk, Ailene Rud, MD, 3 mL at 06/04/20 1104 .  sodium chloride flush (NS) 0.9 % injection 3 mL, 3 mL, Intravenous, PRN, Wouk, Ailene Rud, MD .  umeclidinium bromide (INCRUSE ELLIPTA) 62.5 MCG/INH 1 puff, 1 puff, Inhalation, Daily, Wouk, Ailene Rud, MD, 1 puff at 06/04/20 1104    ALLERGIES   Rivaroxaban     REVIEW OF SYSTEMS    Review of Systems:  Gen:  Denies  fever, sweats, chills weigh loss  HEENT: Denies blurred vision, double vision, ear pain, eye pain,  hearing loss, nose bleeds, sore throat Cardiac:  No dizziness, chest pain or heaviness, chest tightness,edema Resp:   Denies cough or sputum porduction, shortness of breath,wheezing, hemoptysis,  Gi: Denies swallowing difficulty, stomach pain, nausea or vomiting, diarrhea, constipation, bowel incontinence Gu:  Denies bladder incontinence, burning urine Ext:   Denies Joint pain, stiffness or swelling Skin: Denies  skin rash, easy bruising or bleeding or hives Endoc:  Denies polyuria, polydipsia , polyphagia or weight change Psych:   Denies depression, insomnia or hallucinations   Other:  All other systems negative   VS: BP (!) 148/80 (BP Location: Left Arm)   Pulse 93   Temp 98 F (36.7 C) (Oral)   Resp 18   Ht _0  (1.6 m)   Wt 46.1 kg   SpO2 97%   BMI 18.02 kg/m      PHYSICAL EXAM    GENERAL:NAD, no fevers, chills, no weakness no fatigue HEAD: Normocephalic, atraumatic.  EYES: Pupils equal, round, reactive to light. Extraocular muscles intact. No scleral icterus.  MOUTH: Moist mucosal membrane. Dentition intact. No abscess noted.  EAR, NOSE, THROAT: Clear without exudates. No external lesions.  NECK: Supple. No thyromegaly. No nodules. No JVD.  PULMONARY: Decreased air entrly bilaterally CARDIOVASCULAR: S1 and S2. Regular rate and rhythm. No murmurs, rubs, or gallops. No edema. Pedal pulses 2+ bilaterally.  GASTROINTESTINAL: Soft, nontender, nondistended. No masses. Positive bowel sounds. No hepatosplenomegaly.  MUSCULOSKELETAL: No swelling, clubbing, or edema. Range of motion full in all extremities.  NEUROLOGIC: Cranial nerves II through XII are intact. No gross focal neurological deficits. Sensation intact. Reflexes intact.  SKIN: No ulceration, lesions, rashes, or cyanosis. Skin warm and dry. Turgor intact.  PSYCHIATRIC: Mood, affect within normal limits. The patient is awake, alert and oriented x 3. Insight, judgment intact.       IMAGING    DG Chest 1  View  Result Date: 06/02/2020 CLINICAL DATA:  Shortness of breath, right lobe biopsy performed Wednesday EXAM: CHEST  1 VIEW COMPARISON:  Radiograph 12/16/2019, CT 05/30/2020 FINDINGS: There is persistent atelectasis and/or consolidation in the left lung base with a left pleural effusion as was seen on comparison CT imaging 3 days prior. Some additional bronchitic and bronchiectatic changes are seen diffusely throughout the lungs with some chronically coarsened interstitial changes and reticulonodular opacities throughout the lungs in a similar distribution to more remote comparison. No new consolidative opacity. No pneumothorax. No convincing  CT features of edema at this time. The aorta is calcified. The remaining cardiomediastinal contours are unremarkable. No acute osseous or soft tissue abnormality. Degenerative changes are present in the imaged spine and shoulders. Telemetry leads and support devices overlie the chest. IMPRESSION: 1. Persistent atelectasis and/or consolidation in the left lung base with a left pleural effusion as was seen on comparison CT imaging. 2. Some chronic bronchitic/bronchiectatic changes with coarsened interstitium and diffuse reticulonodular opacities conspicuous for a long-standing/chronic atypical infection including mycobacterial etiologies. 3. No other acute cardiopulmonary abnormalities. 4. The aorta is calcified. The remaining cardiomediastinal contours are unremarkable. Electronically Signed   By: Lovena Le M.D.   On: 06/02/2020 05:39   CT CHEST WO CONTRAST  Result Date: 05/30/2020 CLINICAL DATA:  Shortness of breath.  Preop for bronchoscopy. EXAM: CT CHEST WITHOUT CONTRAST TECHNIQUE: Multidetector CT imaging of the chest was performed following the standard protocol without IV contrast. COMPARISON:  May 03, 2020. FINDINGS: Cardiovascular: Atherosclerosis of thoracic aorta is noted without aneurysm formation. Normal cardiac size. Small pericardial effusion is noted.  Coronary artery calcifications are noted. Mediastinum/Nodes: As noted on prior exam, there is extensive mediastinal adenopathy present particularly in the right paratracheal pretracheal subcarinal and left hilar regions. This results in narrowing of the left mainstem bronchus. Thyroid gland is unremarkable. Esophagus is unremarkable. Lungs/Pleura: No pneumothorax is noted. Mild biapical scarring is noted. Mild to moderate left pleural effusion is noted with adjacent left lower lobe postobstructive atelectasis or infiltrate. Upper Abdomen: No acute abnormality. Musculoskeletal: No chest wall mass or suspicious bone lesions identified. IMPRESSION: 1. Mild to moderate left pleural effusion is noted with adjacent left lower lobe postobstructive atelectasis or infiltrate. 2. Small pericardial effusion is noted. 3. Coronary artery calcifications are noted suggesting coronary artery disease. 4. As noted on prior exam, there is extensive mediastinal adenopathy present particularly in the right paratracheal, subcarinal and left hilar regions. This results in narrowing of the left mainstem bronchus. This is concerning for malignancy or metastatic disease. Aortic Atherosclerosis (ICD10-I70.0). Electronically Signed   By: Marijo Conception M.D.   On: 05/30/2020 09:32   US Venous Img Lower Bilateral (DVT)  Result Date: 06/03/2020 CLINICAL DATA:  Shortness of breath EXAM: BILATERAL LOWER EXTREMITY VENOUS DOPPLER ULTRASOUND TECHNIQUE: Gray-scale sonography with compression, as well as color and duplex ultrasound, were performed to evaluate the deep venous system(s) from the level of the common femoral vein through the popliteal and proximal calf veins. COMPARISON:  None. FINDINGS: VENOUS Normal compressibility of the common femoral, superficial femoral, and popliteal veins, as well as the visualized calf veins. Visualized portions of profunda femoral vein and great saphenous vein unremarkable. No filling defects to suggest DVT on  grayscale or color Doppler imaging. Doppler waveforms show normal direction of venous flow, normal respiratory plasticity and response to augmentation. Limited views of the contralateral common femoral vein are unremarkable. OTHER None. Limitations: none IMPRESSION: No acute deep vein thrombosis in the visualized bilateral lower extremities. Electronically Signed   By: Valentino Saxon MD   On: 06/03/2020 13:20          ASSESSMENT/PLAN    Acute on chronic hypoxemic respiratory failure Due to Acute moderate exacerbation of COPD   - agree with levoquin and steroids - have stepped down to bid solumedrol 109m  - ddimer to r/o PE - mild elevation due to active cancer  - dvt [place] r/o DVT- negativ e  -will consider PE study if above is abnormal   - called  and spoke with daugther [name] today    Extensive stage small cell lung cancer   - new diagnosis and may be cause of current exacerbation as mediastinal mass effect is singificant   - Have contacted oncologist - Dr Janese Banks and patient has follow up set up on outpatient.    Anxiety disorder NOS   - complicating respiratory status    - patient reports episodes of breathlessness and feelings of suffocation and drowing due to advanced chronic lung disease.    - she appreciates and responds well do TID PRN ativan 61m.         Thank you for allowing me to participate in the care of this patient.    Patient/Family are satisfied with care plan and all questions have been answered.  This document was prepared using Dragon voice recognition software and may include unintentional dictation errors.     FOttie Glazier M.D.  [facility] [place]

## 2020-06-04 NOTE — Progress Notes (Signed)
Pt complaining of a 6/10 headache. NP Ouma made aware. Will continue to monitor.

## 2020-06-04 NOTE — Progress Notes (Addendum)
PROGRESS NOTE    Christine Morris  KNL:[id number] DOB: 1941/05/05 DOA: 06/02/2020 PCP: Chase Picket, MD    Assessment & Plan:   Active Problems:   CVA (cerebral vascular accident) ([place])   AF (paroxysmal atrial fibrillation) (HCC)   COPD with chronic bronchitis (HCC)   Chronic respiratory failure with hypoxia (HCC)   COPD (chronic obstructive pulmonary disease) with acute bronchitis (HCC)   Acute on chronic respiratory failure (HCC)   Lung cancer ([employer])   COPD with acute exacerbation (HCC)   Christine Morris is a 80 y.o. female with medical history significant for severe copd, home o2 4 L, paroxysmal a fib on doac, hypothyroid, and recent dx of lung cancer, who presents with dyspnea.  Followed by [facility]. Bronchoscopy w/ lavage 05/30/20, brushings showing metastatic scc, culture showing pseudomonas. Has onc f/u scheduled later this month.   # COPD with acute exacerbation # Acute on chronic hypoxic hypercapnic respiratory failure # Hx severe copd on home o2.  --Here tachypnic to 40-50s satting normally on 5 L. Flu/covid neg.  Pt was put on BiPAP. --started on methylpred 60 iv q6, DuoNeb --pulm consulted --weaned off BiPAP and to 4L today. Plan: --cont IV solumedrol 40 mg BID --schedule DuoNeb - cont home breo, daliresp, incurse, singulair  # Sepsis 2/2 pseudomonas PNA --tachycardia, tachypnea, leukocytosis.  No fever, neg procal.  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 --oncology consult today  # Hypokalemia # hypomagnesemia --monitor and replete PRN  # History CVA - cont simvastatin  # Parosyxmal a fib Here in sinus --cont home Eliquis  # hypothyroid - cont home synthroid  # MDD - cont home lexapro  Anxiety --complicating respiratory  status --IV ativan 40m q6h PRN  Debility --PT rec SNF   DVT prophylaxis: OXT:[id number] Status: Full code  Family Communication:  Status is: inpatient Dispo:   The patient is from: home Anticipated d/c is to: home Anticipated d/c date is: 1-2 days Patient currently is not medically ready to d/c due to: respiratory status needs to improve more   Subjective and Interval History:  Pt reported breathing better, off BiPAP to 4L Christine Morris.  Ate a little more.    Objective: Vitals:   06/05/20 1516 06/05/20 2000 06/05/20 2022 06/06/20 0500  BP: 117/77 121/74    Pulse: 98 86    Resp: 17 17    Temp: 98.2 F (36.8 C) 98.1 F (36.7 C)    TempSrc: Oral Oral    SpO2: 100% 98% 99%   Weight:    45.2 kg  Height:        Intake/Output Summary (Last 24 hours) at 06/06/2020 0549 Last data filed at 06/05/2020 2100 Gross per 24 hour  Intake 480 ml  Output 1050 ml  Net -570 ml   Filed Weights   06/04/20 0400 06/05/20 0331 06/06/20 0500  Weight: 46.1 kg 43 kg 45.2 kg    Examination:   Constitutional: NAD, AAOx3 HEENT: conjunctivae and lids normal, EOMI CV: No cyanosis.   RESP: coarse lung sounds, on 4L Extremities: No effusions, edema in BLE SKIN: warm, dry Neuro: II - XII grossly intact.   Psych: Normal mood and affect.  Appropriate judgement and reason   Data Reviewed: I have personally reviewed following labs and imaging studies  CBC: Recent Labs  Lab 05/30/20 1153 06/02/20 0451 06/03/20 0559 06/04/20 0532 06/05/20 0449  WBC  --  16.0* 11.5* 13.5* 10.3  NEUTROABS  --  13.6*  --   --   --   HGB 9.5* 8.9* 8.5* 8.7* 8.5*  HCT 28.0* 30.2* 27.4* 27.9* 27.2*  MCV  --  95.3 94.2 92.1 90.7  PLT  --  441* 358 357 035   Basic Metabolic Panel: Recent Labs  Lab 06/02/20 0451 06/02/20 1738 06/03/20 0559 06/04/20 0532 06/05/20 0449  NA 142 142 143 142 138  K 2.7* 5.2* 4.5 4.5 4.2  CL 102 109 110 107 99  CO2 _0 GLUCOSE 127* 159* 139* 103* 113*  BUN _1 25* 25*  CREATININE 0.61 0.60 0.65 0.68 0.72  CALCIUM 9.6 9.2 9.5 9.8 10.2  MG 1.6*  --  2.1 2.1 1.9   GFR: Estimated Creatinine Clearance: 41.4 mL/min (by C-G formula based on SCr of 0.72 mg/dL). Liver Function Tests: No results for input(s): AST, ALT, ALKPHOS, BILITOT, PROT, ALBUMIN in the last 168 hours. No results for input(s): LIPASE, AMYLASE in the last 168 hours. No results for input(s): AMMONIA in the last 168 hours. Coagulation Profile: Recent Labs  Lab 06/02/20 0451  INR 1.2   Cardiac Enzymes: No results for input(s): CKTOTAL, CKMB, CKMBINDEX, TROPONINI in the last 168 hours. BNP (last 3 results) No results for input(s): PROBNP in the last 8760 hours. HbA1C: No results for input(s): HGBA1C in the last 72 hours. CBG: No results for input(s): GLUCAP in the last 168 hours. Lipid Profile: No results for input(s): CHOL, HDL, LDLCALC, TRIG, CHOLHDL, LDLDIRECT in the last 72 hours. Thyroid Function Tests: No results for input(s): TSH, T4TOTAL, FREET4, T3FREE, THYROIDAB in the last 72 hours. Anemia Panel: No results for input(s): VITAMINB12, FOLATE, FERRITIN, TIBC, IRON, RETICCTPCT in the last 72 hours. Sepsis Labs: Recent Labs  Lab 06/02/20 0451  PROCALCITON <0.10  LATICACIDVEN 1.9    Recent Results (from the past 240 hour(s))  SARS CORONAVIRUS 2 (TAT 6-24 HRS) Nasopharyngeal Nasopharyngeal Swab     Status: None   Collection Time: 05/28/20  3:28 PM   Specimen: Nasopharyngeal Swab  Result Value Ref Range Status   SARS Coronavirus 2 NEGATIVE NEGATIVE Final    Comment: (NOTE) SARS-CoV-2 target nucleic acids are NOT DETECTED.  The SARS-CoV-2 RNA is generally detectable in upper and lower respiratory specimens during the acute phase of infection. Negative results do not preclude SARS-CoV-2 infection, do not rule out co-infections with other pathogens, and should not be used as the sole basis for treatment or other patient management decisions. Negative results must  be combined with clinical observations, patient history, and epidemiological information. The expected result is Negative.  Fact Sheet for Patients: SugarRoll.be  Fact Sheet for Healthcare Providers: https://www.woods-mathews.com/  This test is not yet approved or cleared by the [place] FDA and  has been authorized for detection and/or diagnosis of SARS-CoV-2 by FDA under an Emergency Use Authorization (EUA). This EUA will remain  in effect (meaning this test can be used) for the duration of the COVID-19 declaration under Se ction 564(b)(1) of the Act, 21 U.S.C. section 360bbb-3(b)(1), unless the authorization is terminated or revoked sooner.  Performed at [facility], [place] [address], [place], [place] [address]   Culture, respiratory     Status: None (Preliminary result)   Collection Time: 05/30/20  2:53 PM   Specimen: Bronchial Wash; Respiratory  Result Value Ref  Range Status   Specimen Description   Final    BRONCHIAL WASHINGS Performed at [facility], [address] [place], [place] [address]    Special Requests   Final    NONE Performed at [facility], [address] [place], [place] [address]    Gram Stain   Final    MODERATE WBC PRESENT, PREDOMINANTLY PMN MODERATE GRAM NEGATIVE RODS    Culture   Final    MODERATE PSEUDOMONAS AERUGINOSA Sent to [place] for further susceptibility testing. Performed at [facility], [place] [address], [place], [place] [address]    Report Status PENDING  Incomplete  Acid Fast Smear (AFB)     Status: None   Collection Time: 05/30/20  2:53 PM   Specimen: Bronchoalveolar Lavage; Respiratory  Result Value Ref Range Status   AFB Specimen Processing Concentration  Final   Acid Fast Smear Negative  Final    Comment: (NOTE) Performed At: [facility] [place], [place] [id number] Rush Farmer MD [id number]    Source (AFB) BRONCHIAL  ALVEOLAR LAVAGE  Final    Comment: Performed at [facility], [address] [place], [place] [address]  KOH prep     Status: None   Collection Time: 05/30/20  2:53 PM   Specimen: Bronchoalveolar Lavage  Result Value Ref Range Status   Specimen Description BRONCHIAL ALVEOLAR LAVAGE  Final   Special Requests NONE  Final   KOH Prep   Final    NO YEAST OR FUNGAL ELEMENTS SEEN Performed at [facility], [address] [place], [place] [address]    Report Status 05/30/2020 FINAL  Final  Aspergillus Ag, BAL/Serum     Status: None   Collection Time: 05/30/20  2:53 PM   Specimen: Bronchial Alveolar Lavage  Result Value Ref Range Status   Aspergillus Ag, BAL/Serum 0.05 0.00 - 0.49 Index Final    Comment: (NOTE) Performed At: [facility] [address] [place], [place] [id number] Rush Farmer MD [id number]   Anaerobic culture     Status: None   Collection Time: 05/30/20  2:53 PM   Specimen: Bronchial Wash; Lung  Result Value Ref Range Status   Specimen Description   Final    BRONCHIAL WASHINGS Performed at [facility], [address], [place], [place] [address]    Special Requests   Final    NONE Performed at [facility], [address] [place], [place] [address]    Culture   Final    NO ANAEROBES ISOLATED Performed at [facility], [place] [address] [place], [place] [address]    Report Status 06/05/2020 FINAL  Final  Fungus Culture With Stain     Status: None (Preliminary result)   Collection Time: 05/30/20  2:53 PM  Result Value Ref Range Status   Fungus Stain Final report  Final    Comment: (NOTE) Performed At: [facility] [place], [place] [id number] Rush Farmer MD [id number]    Fungus (Mycology) Culture PENDING  Incomplete   Fungal Source BRONCHIAL ALVEOLAR LAVAGE  Final    Comment: Performed at [facility], [place] [address] [place], [place] [address]  Susceptibility, Aer +  Anaerob     Status: Abnormal   Collection Time: 05/30/20  2:53 PM  Result Value Ref Range Status   Suscept, Aer + Anaerob Final report (A)  Final    Comment: (NOTE) Performed At: [facility] [address]  [place], [place] [id number] Rush Farmer MD [id number]    Source of Sample [phone]  Final    Comment: SUSCEPTIBILITY FOR P.AERUGINOSA BRONCHIAL [facility] Performed at [facility], [place] [address], [place], [place] [address]   Fungus Culture Result     Status: None   Collection Time: 05/30/20  2:53 PM  Result Value Ref Range Status   Result 1 Comment  Final    Comment: (NOTE) KOH/Calcofluor preparation:  no fungus observed. Performed At: [facility] [place], [place] [id number] Rush Farmer MD [id number]   Susceptibility Result     Status: Abnormal   Collection Time: 05/30/20  2:53 PM  Result Value Ref Range Status   Suscept Result 1 Comment (A)  Final    Comment: (NOTE) Pseudomonas aeruginosa Identification performed by account, not confirmed by this laboratory.    Antimicrobial Suscept Comment  Final    Comment: (NOTE)      ** S = Susceptible; I = Intermediate; R = Resistant **                   P = Positive; N = Negative            MICS are expressed in micrograms per mL   Antibiotic                 RSLT#1    RSLT#2    RSLT#3    RSLT#4 Amikacin                       I Cefepime                       S Ceftazidime                    S Ciprofloxacin                  S Gentamicin                     I Imipenem                       S Levofloxacin                   S Meropenem                      S Piperacillin                   S Ticarcillin                    S Tobramycin                     S Performed At: [facility] [employer] [place], [place] [id number] Rush Farmer MD [id number]   Blood culture (routine x 2)     Status: None (Preliminary result)   Collection Time: 06/02/20  4:51 AM   Specimen:  BLOOD  Result Value Ref Range Status   Specimen Description BLOOD RIGHT ANTECUBITAL  Final   Special Requests   Final    BOTTLES DRAWN AEROBIC AND ANAEROBIC Blood Culture adequate volume   Culture   Final    NO GROWTH 3 DAYS Performed at [facility], [address] [place], [place] [address]    Report Status PENDING  Incomplete  Blood  culture (routine x 2)     Status: None (Preliminary result)   Collection Time: 06/02/20  4:51 AM   Specimen: BLOOD  Result Value Ref Range Status   Specimen Description BLOOD BLOOD RIGHT FOREARM  Final   Special Requests   Final    BOTTLES DRAWN AEROBIC AND ANAEROBIC Blood Culture results may not be optimal due to an excessive volume of blood received in culture bottles   Culture   Final    NO GROWTH 3 DAYS Performed at [facility], [address] [place], [place] [address]    Report Status PENDING  Incomplete  Resp Panel by RT-PCR (Flu A&B, Covid) Nasopharyngeal Swab     Status: None   Collection Time: 06/02/20  4:51 AM   Specimen: Nasopharyngeal Swab; Nasopharyngeal(NP) swabs in vial transport medium  Result Value Ref Range Status   SARS Coronavirus 2 by RT PCR NEGATIVE NEGATIVE Final    Comment: (NOTE) SARS-CoV-2 target nucleic acids are NOT DETECTED.  The SARS-CoV-2 RNA is generally detectable in upper respiratory specimens during the acute phase of infection. The lowest concentration of SARS-CoV-2 viral copies this assay can detect is 138 copies/mL. A negative result does not preclude SARS-Cov-2 infection and should not be used as the sole basis for treatment or other patient management decisions. A negative result may occur with  improper specimen collection/handling, submission of specimen other than nasopharyngeal swab, presence of viral mutation(s) within the areas targeted by this assay, and inadequate number of viral copies(<138 copies/mL). A negative result must be combined with clinical observations, patient  history, and epidemiological information. The expected result is Negative.  Fact Sheet for Patients:  EntrepreneurPulse.com.au  Fact Sheet for Healthcare Providers:  IncredibleEmployment.be  This test is no t yet approved or cleared by the [place] FDA and  has been authorized for detection and/or diagnosis of SARS-CoV-2 by FDA under an Emergency Use Authorization (EUA). This EUA will remain  in effect (meaning this test can be used) for the duration of the COVID-19 declaration under Section 564(b)(1) of the Act, 21 U.S.C.section 360bbb-3(b)(1), unless the authorization is terminated  or revoked sooner.       Influenza A by PCR NEGATIVE NEGATIVE Final   Influenza B by PCR NEGATIVE NEGATIVE Final    Comment: (NOTE) The Xpert Xpress SARS-CoV-2/FLU/RSV plus assay is intended as an aid in the diagnosis of influenza from Nasopharyngeal swab specimens and should not be used as a sole basis for treatment. Nasal washings and aspirates are unacceptable for Xpert Xpress SARS-CoV-2/FLU/RSV testing.  Fact Sheet for Patients: EntrepreneurPulse.com.au  Fact Sheet for Healthcare Providers: IncredibleEmployment.be  This test is not yet approved or cleared by the [place] FDA and has been authorized for detection and/or diagnosis of SARS-CoV-2 by FDA under an Emergency Use Authorization (EUA). This EUA will remain in effect (meaning this test can be used) for the duration of the COVID-19 declaration under Section 564(b)(1) of the Act, 21 U.S.C. section 360bbb-3(b)(1), unless the authorization is terminated or revoked.  Performed at [facility], [address], [place], [place] [address]       Radiology Studies: MR BRAIN W WO CONTRAST  Result Date: 06/05/2020 CLINICAL DATA:  Small-cell lung cancer, staging EXAM: MRI HEAD WITHOUT AND WITH CONTRAST TECHNIQUE: Multiplanar, multiecho pulse  sequences of the brain and surrounding structures were obtained without and with intravenous contrast. CONTRAST:  15m GADAVIST GADOBUTROL 1 MMOL/ML IV SOLN COMPARISON:  May 2019 FINDINGS: Motion artifact is present. Findings below  are within this limitation. Brain: There is no acute infarction or intracranial hemorrhage. There is no intracranial mass, mass effect, or edema. There is no hydrocephalus or extra-axial fluid collection. Prominence of the ventricles and sulci reflects generalized parenchymal volume loss. Small chronic infarcts of the right frontal lobe cortex and caudate. Additional patchy and confluent areas of T2 hyperintensity in the supratentorial and pontine white matter are nonspecific but probably reflect moderate chronic microvascular ischemic changes. Appearance is similar to the prior study. No abnormal enhancement. Vascular: Major vessel flow voids at the skull base are preserved. Skull and upper cervical spine: Normal marrow signal is preserved. Sinuses/Orbits: Paranasal sinuses are aerated. Orbits are unremarkable. Other: Sella is unremarkable.  Mastoid air cells are clear. IMPRESSION: Suboptimal evaluation due to motion artifact. No evidence of intracranial metastatic disease. Small chronic infarcts and moderate chronic microvascular ischemic changes. Electronically Signed   By: Macy Mis M.D.   On: 06/05/2020 11:39   DG Chest Port 1 View  Result Date: 06/05/2020 CLINICAL DATA:  Shortness of breath EXAM: PORTABLE CHEST 1 VIEW COMPARISON:  06/02/2020 FINDINGS: Mild blunting of the left costophrenic angle is unchanged. Mild chronic bronchitic type changes. Calcific aortic atherosclerosis. There is left retrocardiac consolidation/atelectasis, unchanged. IMPRESSION: Unchanged left retrocardiac consolidation/atelectasis. Electronically Signed   By: Ulyses Jarred M.D.   On: 06/05/2020 02:30     Scheduled Meds: . apixaban  5 mg Oral BID  . escitalopram  20 mg Oral Daily  .  fluticasone furoate-vilanterol  1 puff Inhalation Daily  . furosemide  20 mg Intravenous Daily  . influenza vaccine adjuvanted  0.5 mL Intramuscular Tomorrow-1000  . ipratropium-albuterol  3 mL Nebulization TID  . levofloxacin  750 mg Oral Daily  . levothyroxine  38 mcg Intravenous Daily  . montelukast  10 mg Oral BH-q7a  . predniSONE  50 mg Oral Q breakfast  . roflumilast  250 mcg Oral Daily  . simvastatin  40 mg Oral QPM  . sodium chloride flush  3 mL Intravenous Q12H  . umeclidinium bromide  1 puff Inhalation Daily   Continuous Infusions: . sodium chloride 250 mL (06/04/20 0619)     LOS: 4 days     Enzo Bi, MD [facility] If 7PM-7AM, please contact night-coverage 06/06/2020, 5:49 AM

## 2020-06-04 NOTE — Progress Notes (Signed)
   06/03/20 2007  Assess: MEWS Score  Temp 98 F (36.7 C)  BP (!) 154/92  Pulse Rate (!) 105  SpO2 97 %  Assess: MEWS Score  MEWS Temp 0  MEWS Systolic 0  MEWS Pulse 1  MEWS RR 1  MEWS LOC 0  MEWS Score 2  MEWS Score Color Yellow  Assess: if the MEWS score is Yellow or Red  Were vital signs taken at a resting state? Yes  Focused Assessment No change from prior assessment  Early Detection of Sepsis Score *See Row Information* Low  MEWS guidelines implemented *See Row Information* Yes  Treat  MEWS Interventions Administered prn meds/treatments;Administered scheduled meds/treatments  Pain Scale 0-10  Pain Score 0  Take Vital Signs  Increase Vital Sign Frequency  Yellow: Q 2hr X 2 then Q 4hr X 2, if remains yellow, continue Q 4hrs  Escalate  MEWS: Escalate Yellow: discuss with charge nurse/RN and consider discussing with provider and RRT  Notify: Charge Nurse/RN  Name of Charge Nurse/RN Notified Hinton Lovely, RN  Date Charge Nurse/RN Notified 06/03/20  Time Charge Nurse/RN Notified 2030

## 2020-06-04 NOTE — Evaluation (Signed)
Physical Therapy Evaluation Patient Details Name: Christine Morris MRN: [id number] DOB: 1941/03/02 Today's Date: 06/04/2020   History of Present Illness  Pt is a 80 y/o F with PMH: chronic hypoxic RF, advanced COPD (on 4-5L at baseline), bronchiectasis, Afib on Eliquis, and  known b/l small pleural effusions (s/p recent lung biopsy on Weds 1/5) who presents via EMS from home for assessment of acute onset of shortness of breath associate with increase in cough; admitted for management of acute/chronic respiratory failured due to COPD exacerbation.  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  Clinical Impression  Upon evaluation, patient alert and oriented; follows commands and demonstrates fair/good effort with mobility tasks.  Denies pain, but easily fatigued/SOB with very minimal exertion.  Generally frail in appearance; globally deconditioned throughout trunk, extremities (though no focal weakness appreciated).  Able to complete bed mobility with close sup; sit/stand, basic transfers and gait (5' x2) with RW, min assist.  Demonstrates short, choppy steps; heavy WBing on RW; poor balance, poor activity tolerance.  Sats maintained >96% on 4L throughout session. Would benefit from skilled PT to address above deficits and promote optimal return to PLOF.; recommend transition to STR upon discharge from acute hospitalization, as patient unable to demonstrate ability to safely negotiate stairs for entry/exit of home and unable to effectively negotiate household distances.  If patient opts for return home, do recommend use of manual WC and cushion, RW and BSC     Follow Up Recommendations SNF (may improve to HHPT if respiratory status improves)    Equipment Recommendations  Rolling walker with 5" wheels;Wheelchair cushion (measurements PT);Wheelchair (measurements PT);3in1 (PT)    Recommendations for Other Services       Precautions / Restrictions  Precautions Precautions: Fall Restrictions Weight Bearing Restrictions: No      Mobility  Bed Mobility Overal bed mobility: Modified Independent             General bed mobility comments: extended time, HOB elevated    Transfers Overall transfer level: Needs assistance Equipment used: Rolling walker (2 wheeled) Transfers: Sit to/from Stand Sit to Stand: Min assist            Ambulation/Gait Ambulation/Gait assistance: Min Web designer (Feet):  (5' x2) Assistive device: Rolling walker (2 wheeled)       General Gait Details: short, choppy steps; heavy WBing on RW; poor balance, poor activity tolerance.  Sats maintained >96% on 4L throughout session  Stairs            Wheelchair Mobility    Modified Rankin (Stroke Patients Only)       Balance Overall balance assessment: Needs assistance Sitting-balance support: No upper extremity supported;Feet supported Sitting balance-Leahy Scale: Good     Standing balance support: Bilateral upper extremity supported Standing balance-Leahy Scale: Fair                               Pertinent Vitals/Pain Pain Assessment: No/denies pain    Home Living Family/patient expects to be discharged to:: Private residence Living Arrangements: Spouse/significant other Available Help at Discharge: Family;Available 24 hours/day Type of Home: Mobile home Home Access: Stairs to enter Entrance Stairs-Rails: Can reach both Entrance Stairs-Number of Steps: 4 Home Layout: One level Home Equipment: Walker - 4 wheels;Shower seat Additional Comments: on 4-5Lnc chronically    Prior Function Level of Independence: Independent with assistive device(s)         Comments:  MOD I with rollator, assist from partner for IADLs. Able to perform BADLs with INDEP/MOD I. On 4L-5Lnc chroniclly (long tube)     Hand Dominance        Extremity/Trunk Assessment   Upper Extremity Assessment Upper Extremity  Assessment: Generalized weakness    Lower Extremity Assessment Lower Extremity Assessment: Generalized weakness (grossly 4-/5 throughotu)       Communication      Cognition Arousal/Alertness: Awake/alert Behavior During Therapy: WFL for tasks assessed/performed;Anxious Overall Cognitive Status: Within Functional Limits for tasks assessed                                        General Comments      Exercises Other Exercises Other Exercises: Toilet transfer, SPT with RW, min assist; sit/stand from [facility] with RW, min assist.  Dep for peri-care and hygiene.  Generally anxious with mobility; easily dyspneic and somewhat anxious.  Frequent rest breaks and cuing for pursed lip breathing throughout   Assessment/Plan    PT Assessment Patient needs continued PT services  PT Problem List Decreased strength;Decreased activity tolerance;Decreased balance;Decreased mobility;Decreased cognition;Decreased knowledge of use of DME;Decreased safety awareness;Decreased knowledge of precautions;Cardiopulmonary status limiting activity       PT Treatment Interventions DME instruction;Gait training;Functional mobility training;Stair training;Therapeutic exercise;Therapeutic activities;Patient/family education    PT Goals (Current goals can be found in the Care Plan section)  Acute Rehab PT Goals Patient Stated Goal: to go home PT Goal Formulation: With patient Time For Goal Achievement: 06/18/20 Potential to Achieve Goals: Fair    Frequency Min 2X/week   Barriers to discharge Decreased caregiver support      Co-evaluation               AM-PAC PT "6 Clicks" Mobility  Outcome Measure Help needed turning from your back to your side while in a flat bed without using bedrails?: None Help needed moving from lying on your back to sitting on the side of a flat bed without using bedrails?: None Help needed moving to and from a bed to a chair (including a wheelchair)?: A  Little Help needed standing up from a chair using your arms (e.g., wheelchair or bedside chair)?: A Little Help needed to walk in hospital room?: A Lot Help needed climbing 3-5 steps with a railing? : A Lot 6 Click Score: 18    End of Session Equipment Utilized During Treatment: Gait belt;Oxygen Activity Tolerance: Patient tolerated treatment well Patient left: in bed;with call bell/phone within reach (OT entered end of session for evaluation; to connect alarm when done) Nurse Communication: Mobility status PT Visit Diagnosis: Muscle weakness (generalized) (M62.81);Difficulty in walking, not elsewhere classified (R26.2)    Time: 6045-4098 PT Time Calculation (min) (ACUTE ONLY): 20 min   Charges:   PT Evaluation $PT Eval Moderate Complexity: 1 Mod PT Treatments $Therapeutic Activity: 8-22 mins       [place] Festa H. Owens Shark, PT, DPT, NCS 06/04/20, 10:42 PM [phone]

## 2020-06-05 ENCOUNTER — Inpatient Hospital Stay: Payer: Medicare Other

## 2020-06-05 ENCOUNTER — Inpatient Hospital Stay: Payer: Medicare Other | Admitting: Oncology

## 2020-06-05 DIAGNOSIS — J441 Chronic obstructive pulmonary disease with (acute) exacerbation: Secondary | ICD-10-CM | POA: Diagnosis not present

## 2020-06-05 DIAGNOSIS — J9622 Acute and chronic respiratory failure with hypercapnia: Secondary | ICD-10-CM | POA: Diagnosis not present

## 2020-06-05 DIAGNOSIS — J9621 Acute and chronic respiratory failure with hypoxia: Secondary | ICD-10-CM | POA: Diagnosis not present

## 2020-06-05 LAB — BASIC METABOLIC PANEL
Anion gap: 11 (ref 5–15)
BUN: 25 mg/dL — ABNORMAL HIGH (ref 8–23)
CO2: 28 mmol/L (ref 22–32)
Calcium: 10.2 mg/dL (ref 8.9–10.3)
Chloride: 99 mmol/L (ref 98–111)
Creatinine, Ser: 0.72 mg/dL (ref 0.44–1.00)
GFR, Estimated: >60 mL/min (ref >60)
Glucose, Bld: 113 mg/dL — ABNORMAL HIGH (ref 70–99)
Potassium: 4.2 mmol/L (ref 3.5–5.1)
Sodium: 138 mmol/L (ref 135–145)

## 2020-06-05 LAB — CBC
HCT: 27.2 % — ABNORMAL LOW (ref 36.0–46.0)
Hemoglobin: 8.5 g/dL — ABNORMAL LOW (ref 12.0–15.0)
MCH: 28.3 pg (ref 26.0–34.0)
MCHC: 31.3 g/dL (ref 30.0–36.0)
MCV: 90.7 fL (ref 80.0–100.0)
Platelets: 391 10*3/uL (ref 150–400)
RBC: 3 MIL/uL — ABNORMAL LOW (ref 3.87–5.11)
RDW: 14.6 % (ref 11.5–15.5)
WBC: 10.3 10*3/uL (ref 4.0–10.5)
nRBC: 0 % (ref 0.0–0.2)

## 2020-06-05 LAB — ANAEROBIC CULTURE

## 2020-06-05 LAB — MAGNESIUM: Magnesium: 1.9 mg/dL (ref 1.7–2.4)

## 2020-06-05 MED ORDER — ONDANSETRON HCL 4 MG/2ML IJ SOLN
4.0000 mg | INTRAMUSCULAR | Status: DC | PRN
  Administered 2020-06-05 – 2020-06-09 (×10): 4 mg via INTRAVENOUS
  Filled 2020-06-05 (×10): qty 2

## 2020-06-05 MED ORDER — INFLUENZA VAC A&B SA ADJ QUAD 0.5 ML IM PRSY
0.5000 mL | PREFILLED_SYRINGE | INTRAMUSCULAR | Status: DC
  Filled 2020-06-05: qty 0.5

## 2020-06-05 MED ORDER — TRAZODONE HCL 50 MG PO TABS: 25.0000 mg | ORAL_TABLET | Freq: Every evening | ORAL | Status: DC | PRN

## 2020-06-05 MED ORDER — PREDNISONE 50 MG PO TABS
50.0000 mg | ORAL_TABLET | Freq: Every day | ORAL | Status: DC
  Administered 2020-06-06: 50 mg via ORAL
  Filled 2020-06-05: qty 1

## 2020-06-05 MED ORDER — IPRATROPIUM-ALBUTEROL 0.5-2.5 (3) MG/3ML IN SOLN
3.0000 mL | Freq: Three times a day (TID) | RESPIRATORY_TRACT | Status: DC
  Administered 2020-06-05 – 2020-06-11 (×19): 3 mL via RESPIRATORY_TRACT
  Filled 2020-06-05 (×18): qty 3

## 2020-06-05 MED ORDER — GADOBUTROL 1 MMOL/ML IV SOLN
4.0000 mL | Freq: Once | INTRAVENOUS | Status: AC | PRN
  Administered 2020-06-05: 4 mL via INTRAVENOUS

## 2020-06-05 MED ORDER — MONTELUKAST SODIUM 10 MG PO TABS
10.0000 mg | ORAL_TABLET | ORAL | Status: DC
  Administered 2020-06-05 – 2020-06-07 (×3): 10 mg via ORAL
  Filled 2020-06-05 (×3): qty 1

## 2020-06-05 MED ORDER — IBUPROFEN 400 MG PO TABS
400.0000 mg | ORAL_TABLET | Freq: Four times a day (QID) | ORAL | Status: DC | PRN
  Administered 2020-06-05: 400 mg via ORAL
  Filled 2020-06-05: qty 1

## 2020-06-05 NOTE — Progress Notes (Signed)
Pulmonary Medicine          Date: 06/05/2020,   MRN# [id number] AXELLE SZWED 06-12-1940     AdmissionWeight: 44.9 kg                 CurrentWeight: 43 kg      CHIEF COMPLAINT:   Mediastinal mass with LLL infiltrate   HISTORY OF PRESENT ILLNESS   Pleasant 80 year old female who was previously seen for worsening dyspnea and advanced COPD with chronic hypoxemia. Patient was last seen in June 2020. She is currently using oxygen 24 hours a day at 3 to 4 L/min. Previously she was on Incruse Ellipta as well as [employer], we had initiated DuoNeb nebulizer therapy as well as Roflumilast 250 mcg daily. Patient states she had initiated this therapy and feels slightly improved.   She continues to make thick tenacious phlegm however reports no problems expectorating it. Today we had discussed bronchopulmonary hygiene techniques including incentive spirometer to combat atelectasis and patient will have one ordered.   She also has bronchiectasis which is COPD related and remains high risk for pneumonia and hospitalization as in January 2020. We had also discussed using low-dose Zithromax thrice weekly for chronic suppressive therapy and patient is agreeable to this plan. I had encouraged patient to participate in graduated exercise program/pulmonary rehab. She denies constitutional symptoms.  08/02/19- O2 concentrator stopped working and she was able to notify [employer], they were kind enough to replace it with a new device on Monday.   12/12/19- patient states she had episodes of epistaxis, ironically she reports using flonase and this somehow has helped her.  Patient had CT chest 10/27/19 we reviewed this together, there is findings suggestive of atypical granulomatous infection such as MAC as well as med/hilar lymphadenopathy suggestive of possible neoplasm, we will obtian PET as recommended.   She stopped using her Inentive spirometer but we have encouraged her to use it more and  she is doing that.   02/14/20- patient was hospitalized for respiratory distress and syncope, she was thought to have possible arrythmia and was monitored with telemetry but subsequently improved and d/cd. She had findings of low BP. She reports staying well hydrated drinking water several times daily and also drinking pepsi. She has good appetite and is eating as well as using ensure shakes to supplement for nourishment but despite all this she has lost 3 more lbs. She hat PET scan we discussed in detail regardign RLL nodule and hilar lymphadenopathy, patient states she feels she will not be able to undergo biopsy or therapy for lung ca and wishes to wait for now and think about it with husband. In the interim we discussed modalities to remove phlegm such as addition of mucomyst to nebulizer regimen. She is to continue TOBI for bronchiectasis  Patient is here today for biopsy of mediastinal mass and airway inspection of lLL infiltrate with post obstructive process.    06/03/20- patient improved clinically she is now on 30Fio2 -BIPAP 12/6.  I have ordered ddimer to rule out dvt/pe and ordered US dvt LE study for today.  If negative on both will not order CTPE protocol since patient just had CT done to reduce radiation load as she does have oncology therapy planned.  Will obtain VBG and remove BIPAP today if able.  Reviewed care plan with patient and husband.  Secure message sent to respiratory therapist.    06/04/20- patient resting in bed, husband at bedside. She is on  2L [place], rhonchi bilaterally on auscultation.  We are in D/C planning stages of hospitalization. Weaning steroids today from 60 bid IV solumedrol to 40 with plan to transition to PO pred.  Antibiotics to PO levoquin 750 po daily for 3 more days to finish on outpatient.  Lasix 20 today for residual mild effusion and congestion on auscultation.  PT and OT today to expedite dc plan. Dicussed care plan with primary attending physician Dr Billie Ruddy.     06/05/20- patient is optimized for dc home.  S/p PT/OT and oncology evaluation.  Will follow up on outpatient basis.  May dc home on prednisone 71m with taper by 51mper day and levoquin 750 for 3 more days please.     PAST MEDICAL HISTORY   Past Medical History:  Diagnosis Date  . Afib ([place]  . Anxiety   . Atrial fibrillation ([facility]  . Cervical stenosis of spine   . COPD (chronic obstructive pulmonary disease) ([place]  . Depression   . Hyperlipidemia   . Hypothyroidism   . Nodule of right lung   . On supplemental oxygen by nasal cannula   . 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 Rupture of extensor tendon of finger   . Spondylosis of cervical region without myelopathy or radiculopathy   . Stroke ([place]  . Vertigo   . Wartenberg syndrome      SURGICAL HISTORY   Past Surgical History:  Procedure Laterality Date  . ABDOMINAL HYSTERECTOMY    . APPENDECTOMY    . BREAST BIOPSY Left 1971  . BREAST CYST EXCISION Left 1971  . CATARACT SURGERY Bilateral   . LOOP RECORDER IMPLANT    . OOPHORECTOMY Bilateral   . VIDEO BRONCHOSCOPY WITH ENDOBRONCHIAL NAVIGATION N/A 05/30/2020   Procedure: VIDEO BRONCHOSCOPY WITH ENDOBRONCHIAL NAVIGATION;  Surgeon: AlOttie GlazierMD;  Location: [facility] ORS;  Service: Thoracic;  Laterality: N/A;  . VIDEO BRONCHOSCOPY WITH ENDOBRONCHIAL ULTRASOUND N/A 05/30/2020   Procedure: VIDEO BRONCHOSCOPY WITH ENDOBRONCHIAL ULTRASOUND;  Surgeon: AlOttie GlazierMD;  Location: [facility] ORS;  Service: Thoracic;  Laterality: N/A;     FAMILY HISTORY   History reviewed. No pertinent family history.   SOCIAL HISTORY   Social History   Tobacco Use  . Smoking status: Former SmResearch scientist (life sciences). Smokeless tobacco: Never Used  Vaping Use  . Vaping Use: Never used  Substance Use Topics  . Alcohol use: Never  . Drug use: Never     MEDICATIONS    Home Medication:    Current Medication:  Current Facility-Administered Medications:  .  0.9 %  sodium chloride  infusion, 250 mL, Intravenous, PRN, WoSi RaiderNoAilene RudMD, Last Rate: 10 mL/hr at 06/04/20 0619, 250 mL at 06/04/20 0619 .  albuterol (PROVENTIL) (2.5 MG/3ML) 0.083% nebulizer solution 2.5 mg, 2.5 mg, Nebulization, Q2H PRN, Wouk, NoAilene RudMD .  apixaban (EOverlake Ambulatory Surgery Center LLCtablet 5 mg, 5 mg, Oral, BID, WaRocky MorelRPH, 5 mg at 06/05/20 0818 .  escitalopram (LEXAPRO) tablet 20 mg, 20 mg, Oral, Daily, Wouk, NoAilene RudMD, 20 mg at 06/05/20 0820 .  fluticasone furoate-vilanterol (BREO ELLIPTA) 200-25 MCG/INH 1 puff, 1 puff, Inhalation, Daily, Wouk, NoAilene RudMD, 1 puff at 06/05/20 [phone]  furosemide (LASIX) injection 20 mg, 20 mg, Intravenous, Daily, AlLanney GinsFuad, MD, 20 mg at 06/05/20 0818 .  influenza vaccine adjuvanted (FLUAD) injection 0.5 mL, 0.5 mL, Intramuscular, Tomorrow-1000, LaEnzo BiMD .  ipratropium-albuterol (DUONEB) 0.5-2.5 (3) MG/3ML nebulizer solution 3  mL, 3 mL, Nebulization, TID, Enzo Bi, MD, 3 mL at 06/05/20 0757 .  levofloxacin (LEVAQUIN) tablet 750 mg, 750 mg, Oral, Daily, Lanney Gins, Kinston Magnan, MD, 750 mg at 06/05/20 0818 .  levothyroxine (SYNTHROID, LEVOTHROID) injection 38 mcg, 38 mcg, Intravenous, Daily, Ouma, Bing Neighbors, NP, 38 mcg at 06/05/20 0820 .  LORazepam (ATIVAN) injection 1 mg, 1 mg, Intravenous, Q6H PRN, Wouk, Ailene Rud, MD, 1 mg at 06/04/20 [phone] .  methylPREDNISolone sodium succinate (SOLU-MEDROL) 40 mg/mL injection 40 mg, 40 mg, Intravenous, Q12H, Lanney Gins, Himani Corona, MD, 40 mg at 06/05/20 0818 .  montelukast (SINGULAIR) tablet 10 mg, 10 mg, Oral, Toya Smothers, MD, 10 mg at 06/05/20 0819 .  roflumilast (DALIRESP) tablet 250 mcg, 250 mcg, Oral, Daily, Wouk, Ailene Rud, MD, 250 mcg at 06/05/20 [phone] .  simvastatin (ZOCOR) tablet 40 mg, 40 mg, Oral, QPM, Wouk, Ailene Rud, MD, 40 mg at 06/04/20 1823 .  sodium chloride flush (NS) 0.9 % injection 3 mL, 3 mL, Intravenous, Q12H, Wouk, Ailene Rud, MD, 3 mL at 06/05/20 [phone] .  sodium chloride flush (NS)  0.9 % injection 3 mL, 3 mL, Intravenous, PRN, Wouk, Ailene Rud, MD .  umeclidinium bromide (INCRUSE ELLIPTA) 62.5 MCG/INH 1 puff, 1 puff, Inhalation, Daily, Wouk, Ailene Rud, MD, 1 puff at 06/05/20 [phone]    ALLERGIES   Rivaroxaban     REVIEW OF SYSTEMS    Review of Systems:  Gen:  Denies  fever, sweats, chills weigh loss  HEENT: Denies blurred vision, double vision, ear pain, eye pain, hearing loss, nose bleeds, sore throat Cardiac:  No dizziness, chest pain or heaviness, chest tightness,edema Resp:   Denies cough or sputum porduction, shortness of breath,wheezing, hemoptysis,  Gi: Denies swallowing difficulty, stomach pain, nausea or vomiting, diarrhea, constipation, bowel incontinence Gu:  Denies bladder incontinence, burning urine Ext:   Denies Joint pain, stiffness or swelling Skin: Denies  skin rash, easy bruising or bleeding or hives Endoc:  Denies polyuria, polydipsia , polyphagia or weight change Psych:   Denies depression, insomnia or hallucinations   Other:  All other systems negative   VS: BP (!) 142/98 (BP Location: Right Arm)   Pulse 100   Temp 98.3 F (36.8 C) (Oral)   Resp 18   Ht _0  (1.6 m)   Wt 43 kg   SpO2 99%   BMI 16.79 kg/m      PHYSICAL EXAM    GENERAL:NAD, no fevers, chills, no weakness no fatigue HEAD: Normocephalic, atraumatic.  EYES: Pupils equal, round, reactive to light. Extraocular muscles intact. No scleral icterus.  MOUTH: Moist mucosal membrane. Dentition intact. No abscess noted.  EAR, NOSE, THROAT: Clear without exudates. No external lesions.  NECK: Supple. No thyromegaly. No nodules. No JVD.  PULMONARY: Decreased air entrly bilaterally CARDIOVASCULAR: S1 and S2. Regular rate and rhythm. No murmurs, rubs, or gallops. No edema. Pedal pulses 2+ bilaterally.  GASTROINTESTINAL: Soft, nontender, nondistended. No masses. Positive bowel sounds. No hepatosplenomegaly.  MUSCULOSKELETAL: No swelling, clubbing, or edema. Range of  motion full in all extremities.  NEUROLOGIC: Cranial nerves II through XII are intact. No gross focal neurological deficits. Sensation intact. Reflexes intact.  SKIN: No ulceration, lesions, rashes, or cyanosis. Skin warm and dry. Turgor intact.  PSYCHIATRIC: Mood, affect within normal limits. The patient is awake, alert and oriented x 3. Insight, judgment intact.       IMAGING    DG Chest 1 View  Result Date: 06/02/2020 CLINICAL DATA:  Shortness of breath, right lobe biopsy  performed Wednesday EXAM: CHEST  1 VIEW COMPARISON:  Radiograph 12/16/2019, CT 05/30/2020 FINDINGS: There is persistent atelectasis and/or consolidation in the left lung base with a left pleural effusion as was seen on comparison CT imaging 3 days prior. Some additional bronchitic and bronchiectatic changes are seen diffusely throughout the lungs with some chronically coarsened interstitial changes and reticulonodular opacities throughout the lungs in a similar distribution to more remote comparison. No new consolidative opacity. No pneumothorax. No convincing CT features of edema at this time. The aorta is calcified. The remaining cardiomediastinal contours are unremarkable. No acute osseous or soft tissue abnormality. Degenerative changes are present in the imaged spine and shoulders. Telemetry leads and support devices overlie the chest. IMPRESSION: 1. Persistent atelectasis and/or consolidation in the left lung base with a left pleural effusion as was seen on comparison CT imaging. 2. Some chronic bronchitic/bronchiectatic changes with coarsened interstitium and diffuse reticulonodular opacities conspicuous for a long-standing/chronic atypical infection including mycobacterial etiologies. 3. No other acute cardiopulmonary abnormalities. 4. The aorta is calcified. The remaining cardiomediastinal contours are unremarkable. Electronically Signed   By: Lovena Le M.D.   On: 06/02/2020 05:39   CT CHEST WO CONTRAST  Result Date:  05/30/2020 CLINICAL DATA:  Shortness of breath.  Preop for bronchoscopy. EXAM: CT CHEST WITHOUT CONTRAST TECHNIQUE: Multidetector CT imaging of the chest was performed following the standard protocol without IV contrast. COMPARISON:  May 03, 2020. FINDINGS: Cardiovascular: Atherosclerosis of thoracic aorta is noted without aneurysm formation. Normal cardiac size. Small pericardial effusion is noted. Coronary artery calcifications are noted. Mediastinum/Nodes: As noted on prior exam, there is extensive mediastinal adenopathy present particularly in the right paratracheal pretracheal subcarinal and left hilar regions. This results in narrowing of the left mainstem bronchus. Thyroid gland is unremarkable. Esophagus is unremarkable. Lungs/Pleura: No pneumothorax is noted. Mild biapical scarring is noted. Mild to moderate left pleural effusion is noted with adjacent left lower lobe postobstructive atelectasis or infiltrate. Upper Abdomen: No acute abnormality. Musculoskeletal: No chest wall mass or suspicious bone lesions identified. IMPRESSION: 1. Mild to moderate left pleural effusion is noted with adjacent left lower lobe postobstructive atelectasis or infiltrate. 2. Small pericardial effusion is noted. 3. Coronary artery calcifications are noted suggesting coronary artery disease. 4. As noted on prior exam, there is extensive mediastinal adenopathy present particularly in the right paratracheal, subcarinal and left hilar regions. This results in narrowing of the left mainstem bronchus. This is concerning for malignancy or metastatic disease. Aortic Atherosclerosis (ICD10-I70.0). Electronically Signed   By: Marijo Conception M.D.   On: 05/30/2020 09:32   US Venous Img Lower Bilateral (DVT)  Result Date: 06/03/2020 CLINICAL DATA:  Shortness of breath EXAM: BILATERAL LOWER EXTREMITY VENOUS DOPPLER ULTRASOUND TECHNIQUE: Gray-scale sonography with compression, as well as color and duplex ultrasound, were performed to  evaluate the deep venous system(s) from the level of the common femoral vein through the popliteal and proximal calf veins. COMPARISON:  None. FINDINGS: VENOUS Normal compressibility of the common femoral, superficial femoral, and popliteal veins, as well as the visualized calf veins. Visualized portions of profunda femoral vein and great saphenous vein unremarkable. No filling defects to suggest DVT on grayscale or color Doppler imaging. Doppler waveforms show normal direction of venous flow, normal respiratory plasticity and response to augmentation. Limited views of the contralateral common femoral vein are unremarkable. OTHER None. Limitations: none IMPRESSION: No acute deep vein thrombosis in the visualized bilateral lower extremities. Electronically Signed   By: Valentino Saxon MD  On: 06/03/2020 13:20   DG Chest Port 1 View  Result Date: 06/05/2020 CLINICAL DATA:  Shortness of breath EXAM: PORTABLE CHEST 1 VIEW COMPARISON:  06/02/2020 FINDINGS: Mild blunting of the left costophrenic angle is unchanged. Mild chronic bronchitic type changes. Calcific aortic atherosclerosis. There is left retrocardiac consolidation/atelectasis, unchanged. IMPRESSION: Unchanged left retrocardiac consolidation/atelectasis. Electronically Signed   By: Ulyses Jarred M.D.   On: 06/05/2020 02:30          ASSESSMENT/PLAN    Acute on chronic hypoxemic respiratory failure Due to Acute moderate exacerbation of COPD   - agree with levoquin and steroids - have stepped down to bid solumedrol 1m>>>prednisone with taper  - ddimer to r/o PE - mild elevation due to active cancer  - dvt UKoreato r/o DVT- negativ e  -will consider PE study if above is abnormal   - called and spoke with daugther [name] today    Extensive stage small cell lung cancer   - new diagnosis and may be cause of current exacerbation as mediastinal mass effect is singificant   - Have contacted oncologist - Dr RJanese Banksand patient has follow up set up  on outpatient.    Anxiety disorder NOS   - complicating respiratory status    - patient reports episodes of breathlessness and feelings of suffocation and drowing due to advanced chronic lung disease.    - she appreciates and responds well do TID PRN ativan 146m         Thank you for allowing me to participate in the care of this patient.    Patient/Family are satisfied with care plan and all questions have been answered.  This document was prepared using Dragon voice recognition software and may include unintentional dictation errors.     FuOttie GlazierM.D.  Division of [place]

## 2020-06-05 NOTE — NC FL2 (Signed)
[place] LEVEL OF CARE SCREENING TOOL     IDENTIFICATION  Patient Name: Christine Morris Birthdate: 04-14-1941 Sex: female Admission Date (Current Location): 06/02/2020  [place] and [place] Number:  Engineering geologist and Address:  [facility], [address] [place], [place] [address]      Provider Number: [id number]  Attending Physician Name and Address:  Enzo Bi, MD  Relative Name and Phone Number:  [place] [phone]    Current Level of Care: Hospital Recommended Level of Care: [place] Prior Approval Number:    Date Approved/Denied:   PASRR Number: [id number]  Discharge Plan: SNF    Current Diagnoses: Patient Active Problem List   Diagnosis Date Noted  . COPD (chronic obstructive pulmonary disease) with acute bronchitis ([place]) 06/02/2020  . Acute on chronic respiratory failure ([place]) 06/02/2020  . Lung cancer ([place]) 06/02/2020  . COPD with acute exacerbation ([place]) 06/02/2020  . Syncope 12/16/2019  . AF (paroxysmal atrial fibrillation) ([place]) 12/16/2019  . COPD with chronic bronchitis ([place]) 12/16/2019  . Chronic respiratory failure with hypoxia ([place]) 12/16/2019  . Sepsis ([place]) 05/31/2018  . TIA (transient ischemic attack) 10/11/2017  . CVA (cerebral vascular accident) ([place]) 10/11/2017  . Anxiety and depression 05/08/2015    Orientation RESPIRATION BLADDER Height & Weight     Self,Time,Place  O2 (4L [place]) Continent Weight: 43 kg Height:  5\' 3"  (160 cm)  BEHAVIORAL SYMPTOMS/MOOD NEUROLOGICAL BOWEL NUTRITION STATUS      Continent Diet  AMBULATORY STATUS COMMUNICATION OF NEEDS Skin   Limited Assist Verbally Normal                       Personal Care Assistance Level of Assistance  Bathing,Feeding,Dressing Bathing Assistance: Limited assistance Feeding assistance: Limited assistance Dressing Assistance: Limited assistance     Functional Limitations Info  Sight,Hearing,Speech Sight Info:  Adequate Hearing Info: Adequate Speech Info: Adequate    SPECIAL CARE FACTORS FREQUENCY  PT (By licensed PT),OT (By licensed OT)     PT Frequency: 5X WEEK OT Frequency: 5X WEEK            Contractures Contractures Info: Not present    Additional Factors Info  Code Status,Allergies Code Status Info: Full Allergies Info: Rivaroxaban           Current Medications (06/05/2020):  This is the current hospital active medication list Current Facility-Administered Medications  Medication Dose Route Frequency Provider Last Rate Last Admin  . 0.9 %  sodium chloride infusion  250 mL Intravenous PRN Gwynne Edinger, MD 10 mL/hr at 06/04/20 0619 250 mL at 06/04/20 0619  . albuterol (PROVENTIL) (2.5 MG/3ML) 0.083% nebulizer solution 2.5 mg  2.5 mg Nebulization Q2H PRN Wouk, Ailene Rud, MD      . apixaban Arne Cleveland) tablet 5 mg  5 mg Oral BID Rocky Morel, RPH   5 mg at 06/05/20 0818  . escitalopram (LEXAPRO) tablet 20 mg  20 mg Oral Daily Gwynne Edinger, MD   20 mg at 06/05/20 0820  . fluticasone furoate-vilanterol (BREO ELLIPTA) 200-25 MCG/INH 1 puff  1 puff Inhalation Daily Gwynne Edinger, MD   1 puff at 06/05/20 [phone]  . furosemide (LASIX) injection 20 mg  20 mg Intravenous Daily Ottie Glazier, MD   20 mg at 06/05/20 0818  . influenza vaccine adjuvanted (FLUAD) injection 0.5 mL  0.5 mL Intramuscular Tomorrow-1000 Enzo Bi, MD      . ipratropium-albuterol (DUONEB) 0.5-2.5 (3)  MG/3ML nebulizer solution 3 mL  3 mL Nebulization TID Enzo Bi, MD   3 mL at 06/05/20 0757  . levofloxacin (LEVAQUIN) tablet 750 mg  750 mg Oral Daily Ottie Glazier, MD   750 mg at 06/05/20 0818  . levothyroxine (SYNTHROID, LEVOTHROID) injection 38 mcg  38 mcg Intravenous Daily Lang Snow, NP   38 mcg at 06/05/20 0820  . LORazepam (ATIVAN) injection 1 mg  1 mg Intravenous Q6H PRN Gwynne Edinger, MD   1 mg at 06/04/20 [phone]  . montelukast (SINGULAIR) tablet 10 mg  10 mg Oral Toya Smothers, MD   10 mg at 06/05/20 0819  . [START ON 06/06/2020] predniSONE (DELTASONE) tablet 50 mg  50 mg Oral Q breakfast Ottie Glazier, MD      . roflumilast (DALIRESP) tablet 250 mcg  250 mcg Oral Daily Gwynne Edinger, MD   250 mcg at 06/05/20 [phone]  . simvastatin (ZOCOR) tablet 40 mg  40 mg Oral QPM Wouk, Ailene Rud, MD   40 mg at 06/04/20 1823  . sodium chloride flush (NS) 0.9 % injection 3 mL  3 mL Intravenous Q12H Wouk, Ailene Rud, MD   3 mL at 06/05/20 [phone]  . sodium chloride flush (NS) 0.9 % injection 3 mL  3 mL Intravenous PRN Wouk, Ailene Rud, MD      . umeclidinium bromide (INCRUSE ELLIPTA) 62.5 MCG/INH 1 puff  1 puff Inhalation Daily Wouk, Ailene Rud, MD   1 puff at 06/05/20 4166     Discharge Medications: Please see discharge summary for a list of discharge medications.  Relevant Imaging Results:  Relevant Lab Results:   Additional Information    Kerin Salen, RN

## 2020-06-05 NOTE — Progress Notes (Signed)
Mobility Specialist - Progress Note   06/05/20 1240  Mobility  Activity Transferred:  Bed to chair  Level of Assistance Contact guard assist, steadying assist  Assistive Device Front wheel walker  Distance Ambulated (ft) 5 ft  Mobility Response Tolerated well  Mobility performed by Mobility specialist  $Mobility charge 1 Mobility    Pre-mobility: 84 HR, 99% SpO2 During mobility: 112 HR, 98% SpO2 Post-mobility: 102 HR, 98% SpO2   Pt laying in bed upon arrival. Pt agreed to session. Pt able to get to EOB mod. Independently. Pt S2S to RW CGA. Pt transferred to chair CGA. VC required for hand placement, sequencing and safety. Overall, pt tolerated session well. O2 sat > 97%. Pt on 3L O2 . Pt left sitting on recliner w/ alarm set. Call bell and phone placed in reach. Nurse was notified.     Fadil Macmaster Mobility Specialist  06/05/20, 12:43 PM

## 2020-06-05 NOTE — Progress Notes (Signed)
PROGRESS NOTE    Christine Morris  ZOX:[id number] DOB: 1941-05-07 DOA: 06/02/2020 PCP: Chase Picket, MD    Assessment & Plan:   Active Problems:   CVA (cerebral vascular accident) ([place])   AF (paroxysmal atrial fibrillation) (HCC)   COPD with chronic bronchitis (HCC)   Chronic respiratory failure with hypoxia (HCC)   COPD (chronic obstructive pulmonary disease) with acute bronchitis (HCC)   Acute on chronic respiratory failure (HCC)   Lung cancer ([place])   COPD with acute exacerbation (HCC)   Christine Morris is a 80 y.o. female with medical history significant for severe copd, home o2 4 L, paroxysmal a fib on doac, hypothyroid, and recent dx of lung cancer, who presents with dyspnea.  Followed by [facility]. Bronchoscopy w/ lavage 05/30/20, brushings showing metastatic scc, culture showing pseudomonas. Has onc f/u scheduled later this month.   # COPD with acute exacerbation # Acute on chronic hypoxic hypercapnic respiratory failure # Hx severe copd on home o2.  --Here tachypnic to 40-50s satting normally on 5 L. Flu/covid neg.  Pt was put on BiPAP. --started on methylpred 60 iv q6, DuoNeb. --pulm consulted --weaned off BiPAP and to 4L today. Plan: --taper from IV solumedrol to prednisone 50 mg daily --schedule DuoNeb - cont home breo, daliresp, incurse, singulair --Continue supplemental O2 to keep sats between 88-92%, wean as tolerated  # Sepsis 2/2 pseudomonas PNA --tachycardia, tachypnea, leukocytosis.  No fever, neg procal.  Bronch lavage positive for pseudomonas - started on zosyn and levaquin on admission (discussed w/ Raul Del who advises double coverage for now) --zosyn d/c'ed. 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 Janese Banks to arrange chemo/RT --oupatient palliative care consult  # Hypokalemia # hypomagnesemia --monitor and replete PRN  # History CVA - cont simvastatin  # Parosyxmal  a fib Here in sinus --cont home Eliquis  # hypothyroid - cont home synthroid  # MDD - cont home lexapro  Anxiety --complicating respiratory status --IV ativan 69m q6h PRN  Debility --PT rec SNF   DVT prophylaxis: [id number]:[id number] Status: Full code  Family Communication:  Status is: inpatient Dispo:   The patient is from: home Anticipated d/c is to: home Anticipated d/c date is: whenever bed available Patient currently is medically ready to d/c.   Subjective and Interval History:  Doing better, ready to be discharged per pulm, however, PT rec SNF rehab.  Has been off of BiPAP.     Objective: Vitals:   06/05/20 1516 06/05/20 2000 06/05/20 2022 06/06/20 0500  BP: 117/77 121/74    Pulse: 98 86    Resp: 17 17    Temp: 98.2 F (36.8 C) 98.1 F (36.7 C)    TempSrc: Oral Oral    SpO2: 100% 98% 99%   Weight:    45.2 kg  Height:        Intake/Output Summary (Last 24 hours) at 06/06/2020 0601 Last data filed at 06/05/2020 2100 Gross per 24 hour  Intake 480 ml  Output 1050 ml  Net -570 ml   Filed Weights   06/04/20 0400 06/05/20 0331 06/06/20 0500  Weight: 46.1 kg 43 kg 45.2 kg    Examination:   Constitutional: NAD, AAOx3 HEENT: conjunctivae and lids normal, EOMI CV: No cyanosis.   RESP: on 3L  Extremities: No effusions, edema in BLE SKIN: warm, dry Neuro: II - XII grossly intact.   Psych: Normal mood and affect.  Appropriate judgement and reason  Data Reviewed: I have personally reviewed following labs and imaging studies  CBC: Recent Labs  Lab 05/30/20 1153 06/02/20 0451 06/03/20 0559 06/04/20 0532 06/05/20 0449  WBC  --  16.0* 11.5* 13.5* 10.3  NEUTROABS  --  13.6*  --   --   --   HGB 9.5* 8.9* 8.5* 8.7* 8.5*  HCT 28.0* 30.2* 27.4* 27.9* 27.2*  MCV  --  95.3 94.2 92.1 90.7  PLT  --  441* 358 357 568   Basic Metabolic Panel: Recent Labs  Lab 06/02/20 0451 06/02/20 1738 06/03/20 0559 06/04/20 0532 06/05/20 0449  NA 142 142 143 142  138  K 2.7* 5.2* 4.5 4.5 4.2  CL 102 109 110 107 99  CO2 _0 GLUCOSE 127* 159* 139* 103* 113*  BUN _1 25* 25*  CREATININE 0.61 0.60 0.65 0.68 0.72  CALCIUM 9.6 9.2 9.5 9.8 10.2  MG 1.6*  --  2.1 2.1 1.9   GFR: Estimated Creatinine Clearance: 41.4 mL/min (by C-G formula based on SCr of 0.72 mg/dL). Liver Function Tests: No results for input(s): AST, ALT, ALKPHOS, BILITOT, PROT, ALBUMIN in the last 168 hours. No results for input(s): LIPASE, AMYLASE in the last 168 hours. No results for input(s): AMMONIA in the last 168 hours. Coagulation Profile: Recent Labs  Lab 06/02/20 0451  INR 1.2   Cardiac Enzymes: No results for input(s): CKTOTAL, CKMB, CKMBINDEX, TROPONINI in the last 168 hours. BNP (last 3 results) No results for input(s): PROBNP in the last 8760 hours. HbA1C: No results for input(s): HGBA1C in the last 72 hours. CBG: No results for input(s): GLUCAP in the last 168 hours. Lipid Profile: No results for input(s): CHOL, HDL, LDLCALC, TRIG, CHOLHDL, LDLDIRECT in the last 72 hours. Thyroid Function Tests: No results for input(s): TSH, T4TOTAL, FREET4, T3FREE, THYROIDAB in the last 72 hours. Anemia Panel: No results for input(s): VITAMINB12, FOLATE, FERRITIN, TIBC, IRON, RETICCTPCT in the last 72 hours. Sepsis Labs: Recent Labs  Lab 06/02/20 0451  PROCALCITON <0.10  LATICACIDVEN 1.9    Recent Results (from the past 240 hour(s))  SARS CORONAVIRUS 2 (TAT 6-24 HRS) Nasopharyngeal Nasopharyngeal Swab     Status: None   Collection Time: 05/28/20  3:28 PM   Specimen: Nasopharyngeal Swab  Result Value Ref Range Status   SARS Coronavirus 2 NEGATIVE NEGATIVE Final    Comment: (NOTE) SARS-CoV-2 target nucleic acids are NOT DETECTED.  The SARS-CoV-2 RNA is generally detectable in upper and lower respiratory specimens during the acute phase of infection. Negative results do not preclude SARS-CoV-2 infection, do not rule out co-infections with other  pathogens, and should not be used as the sole basis for treatment or other patient management decisions. Negative results must be combined with clinical observations, patient history, and epidemiological information. The expected result is Negative.  Fact Sheet for Patients: SugarRoll.be  Fact Sheet for Healthcare Providers: https://www.woods-mathews.com/  This test is not yet approved or cleared by the [place] FDA and  has been authorized for detection and/or diagnosis of SARS-CoV-2 by FDA under an Emergency Use Authorization (EUA). This EUA will remain  in effect (meaning this test can be used) for the duration of the COVID-19 declaration under Se ction 564(b)(1) of the Act, 21 U.S.C. section 360bbb-3(b)(1), unless the authorization is terminated or revoked sooner.  Performed at [facility], [place] [address] [place], [place] [address]   Culture, respiratory     Status: None (Preliminary result)  Collection Time: 05/30/20  2:53 PM   Specimen: Bronchial Wash; Respiratory  Result Value Ref Range Status   Specimen Description   Final    BRONCHIAL WASHINGS Performed at [facility], [address] [place], [place] [address]    Special Requests   Final    NONE Performed at [facility], [address] [place], [place] [address]    Gram Stain   Final    MODERATE WBC PRESENT, PREDOMINANTLY PMN MODERATE GRAM NEGATIVE RODS    Culture   Final    MODERATE PSEUDOMONAS AERUGINOSA Sent to [place] for further susceptibility testing. Performed at [facility], [place] [address], [place], [place] [address]    Report Status PENDING  Incomplete  Acid Fast Smear (AFB)     Status: None   Collection Time: 05/30/20  2:53 PM   Specimen: Bronchoalveolar Lavage; Respiratory  Result Value Ref Range Status   AFB Specimen Processing Concentration  Final   Acid Fast Smear Negative  Final    Comment: (NOTE) Performed  At: [facility] [place], [place] [id number] Rush Farmer MD BS:[id number]    Source (AFB) BRONCHIAL ALVEOLAR LAVAGE  Final    Comment: Performed at [facility], [address] [place], [place] [address]  KOH prep     Status: None   Collection Time: 05/30/20  2:53 PM   Specimen: Bronchoalveolar Lavage  Result Value Ref Range Status   Specimen Description BRONCHIAL ALVEOLAR LAVAGE  Final   Special Requests NONE  Final   KOH Prep   Final    NO YEAST OR FUNGAL ELEMENTS SEEN Performed at [facility], [address], [place], [place] [address]    Report Status 05/30/2020 FINAL  Final  Aspergillus Ag, BAL/Serum     Status: None   Collection Time: 05/30/20  2:53 PM   Specimen: Bronchial Alveolar Lavage  Result Value Ref Range Status   Aspergillus Ag, BAL/Serum 0.05 0.00 - 0.49 Index Final    Comment: (NOTE) Performed At: [facility] [address] [place], [place] [id number] Rush Farmer MD SP:[id number]   Anaerobic culture     Status: None   Collection Time: 05/30/20  2:53 PM   Specimen: Bronchial Wash; Lung  Result Value Ref Range Status   Specimen Description   Final    BRONCHIAL WASHINGS Performed at [facility], [address], [place], [place] [address]    Special Requests   Final    NONE Performed at [facility], [address], [place], [place] [address]    Culture   Final    NO ANAEROBES ISOLATED Performed at [facility], [place] [address] [place], [place] [address]    Report Status 06/05/2020 FINAL  Final  Fungus Culture With Stain     Status: None (Preliminary result)   Collection Time: 05/30/20  2:53 PM  Result Value Ref Range Status   Fungus Stain Final report  Final    Comment: (NOTE) Performed At: [facility] [place], [place] [id number] Rush Farmer MD XB:[id number]    Fungus (Mycology) Culture PENDING  Incomplete   Fungal Source BRONCHIAL  ALVEOLAR LAVAGE  Final    Comment: Performed at [facility], [place] [address], [place], [place] [address]  Susceptibility, Aer + Anaerob     Status: Abnormal   Collection Time: 05/30/20  2:53 PM  Result Value Ref Range Status   Suscept, Aer + Anaerob Final report (  A)  Final    Comment: (NOTE) Performed At: [facility] [place], [place] [id number] Rush Farmer MD AT:[id number]    Source of Sample [id number]  Final    Comment: SUSCEPTIBILITY FOR P.AERUGINOSA BRONCHIAL [facility] Performed at [facility], [place] [address] [place], [place] [address]   Fungus Culture Result     Status: None   Collection Time: 05/30/20  2:53 PM  Result Value Ref Range Status   Result 1 Comment  Final    Comment: (NOTE) KOH/Calcofluor preparation:  no fungus observed. Performed At: [facility] [place], [place] [id number] Rush Farmer MD WV:[id number]   Susceptibility Result     Status: Abnormal   Collection Time: 05/30/20  2:53 PM  Result Value Ref Range Status   Suscept Result 1 Comment (A)  Final    Comment: (NOTE) Pseudomonas aeruginosa Identification performed by account, not confirmed by this laboratory.    Antimicrobial Suscept Comment  Final    Comment: (NOTE)      ** S = Susceptible; I = Intermediate; R = Resistant **                   P = Positive; N = Negative            MICS are expressed in micrograms per mL   Antibiotic                 RSLT#1    RSLT#2    RSLT#3    RSLT#4 Amikacin                       I Cefepime                       S Ceftazidime                    S Ciprofloxacin                  S Gentamicin                     I Imipenem                       S Levofloxacin                   S Meropenem                      S Piperacillin                   S Ticarcillin                    S Tobramycin                     S Performed At: [facility] [employer] [place], [place] [id number] Rush Farmer MD  KX:[id number]   Blood culture (routine x 2)     Status: None (Preliminary result)   Collection Time: 06/02/20  4:51 AM   Specimen: BLOOD  Result Value Ref Range Status   Specimen Description BLOOD RIGHT ANTECUBITAL  Final   Special Requests   Final    BOTTLES DRAWN AEROBIC AND ANAEROBIC Blood Culture adequate volume   Culture   Final    NO GROWTH 4 DAYS Performed at [facility], [address] [place], [place] [address]    Report Status PENDING  Incomplete  Blood culture (routine x 2)     Status: None (Preliminary result)   Collection Time: 06/02/20  4:51 AM   Specimen: BLOOD  Result Value Ref Range Status   Specimen Description BLOOD BLOOD RIGHT FOREARM  Final   Special Requests   Final    BOTTLES DRAWN AEROBIC AND ANAEROBIC Blood Culture results may not be optimal due to an excessive volume of blood received in culture bottles   Culture   Final    NO GROWTH 4 DAYS Performed at [facility], [address], [place], [place] [address]    Report Status PENDING  Incomplete  Resp Panel by RT-PCR (Flu A&B, Covid) Nasopharyngeal Swab     Status: None   Collection Time: 06/02/20  4:51 AM   Specimen: Nasopharyngeal Swab; Nasopharyngeal(NP) swabs in vial transport medium  Result Value Ref Range Status   SARS Coronavirus 2 by RT PCR NEGATIVE NEGATIVE Final    Comment: (NOTE) SARS-CoV-2 target nucleic acids are NOT DETECTED.  The SARS-CoV-2 RNA is generally detectable in upper respiratory specimens during the acute phase of infection. The lowest concentration of SARS-CoV-2 viral copies this assay can detect is 138 copies/mL. A negative result does not preclude SARS-Cov-2 infection and should not be used as the sole basis for treatment or other patient management decisions. A negative result may occur with  improper specimen collection/handling, submission of specimen other than nasopharyngeal swab, presence of viral mutation(s) within the areas targeted by this  assay, and inadequate number of viral copies(<138 copies/mL). A negative result must be combined with clinical observations, patient history, and epidemiological information. The expected result is Negative.  Fact Sheet for Patients:  EntrepreneurPulse.com.au  Fact Sheet for Healthcare Providers:  IncredibleEmployment.be  This test is no t yet approved or cleared by the [place] FDA and  has been authorized for detection and/or diagnosis of SARS-CoV-2 by FDA under an Emergency Use Authorization (EUA). This EUA will remain  in effect (meaning this test can be used) for the duration of the COVID-19 declaration under Section 564(b)(1) of the Act, 21 U.S.C.section 360bbb-3(b)(1), unless the authorization is terminated  or revoked sooner.       Influenza A by PCR NEGATIVE NEGATIVE Final   Influenza B by PCR NEGATIVE NEGATIVE Final    Comment: (NOTE) The Xpert Xpress SARS-CoV-2/FLU/RSV plus assay is intended as an aid in the diagnosis of influenza from Nasopharyngeal swab specimens and should not be used as a sole basis for treatment. Nasal washings and aspirates are unacceptable for Xpert Xpress SARS-CoV-2/FLU/RSV testing.  Fact Sheet for Patients: EntrepreneurPulse.com.au  Fact Sheet for Healthcare Providers: IncredibleEmployment.be  This test is not yet approved or cleared by the [place] FDA and has been authorized for detection and/or diagnosis of SARS-CoV-2 by FDA under an Emergency Use Authorization (EUA). This EUA will remain in effect (meaning this test can be used) for the duration of the COVID-19 declaration under Section 564(b)(1) of the Act, 21 U.S.C. section 360bbb-3(b)(1), unless the authorization is terminated or revoked.  Performed at [facility], [address] [place], [place] [address]       Radiology Studies: MR BRAIN W WO CONTRAST  Result Date:  06/05/2020 CLINICAL DATA:  Small-cell lung cancer, staging EXAM: MRI HEAD WITHOUT AND WITH CONTRAST TECHNIQUE: Multiplanar, multiecho pulse sequences of the brain and surrounding structures were obtained without and with intravenous contrast. CONTRAST:  66m GADAVIST  GADOBUTROL 1 MMOL/ML IV SOLN COMPARISON:  May 2019 FINDINGS: Motion artifact is present. Findings below are within this limitation. Brain: There is no acute infarction or intracranial hemorrhage. There is no intracranial mass, mass effect, or edema. There is no hydrocephalus or extra-axial fluid collection. Prominence of the ventricles and sulci reflects generalized parenchymal volume loss. Small chronic infarcts of the right frontal lobe cortex and caudate. Additional patchy and confluent areas of T2 hyperintensity in the supratentorial and pontine white matter are nonspecific but probably reflect moderate chronic microvascular ischemic changes. Appearance is similar to the prior study. No abnormal enhancement. Vascular: Major vessel flow voids at the skull base are preserved. Skull and upper cervical spine: Normal marrow signal is preserved. Sinuses/Orbits: Paranasal sinuses are aerated. Orbits are unremarkable. Other: Sella is unremarkable.  Mastoid air cells are clear. IMPRESSION: Suboptimal evaluation due to motion artifact. No evidence of intracranial metastatic disease. Small chronic infarcts and moderate chronic microvascular ischemic changes. Electronically Signed   By: Macy Mis M.D.   On: 06/05/2020 11:39   DG Chest Port 1 View  Result Date: 06/05/2020 CLINICAL DATA:  Shortness of breath EXAM: PORTABLE CHEST 1 VIEW COMPARISON:  06/02/2020 FINDINGS: Mild blunting of the left costophrenic angle is unchanged. Mild chronic bronchitic type changes. Calcific aortic atherosclerosis. There is left retrocardiac consolidation/atelectasis, unchanged. IMPRESSION: Unchanged left retrocardiac consolidation/atelectasis. Electronically Signed   By:  Ulyses Jarred M.D.   On: 06/05/2020 02:30     Scheduled Meds: . apixaban  5 mg Oral BID  . escitalopram  20 mg Oral Daily  . fluticasone furoate-vilanterol  1 puff Inhalation Daily  . furosemide  20 mg Intravenous Daily  . influenza vaccine adjuvanted  0.5 mL Intramuscular Tomorrow-1000  . ipratropium-albuterol  3 mL Nebulization TID  . levofloxacin  750 mg Oral Daily  . levothyroxine  38 mcg Intravenous Daily  . montelukast  10 mg Oral BH-q7a  . predniSONE  50 mg Oral Q breakfast  . roflumilast  250 mcg Oral Daily  . simvastatin  40 mg Oral QPM  . sodium chloride flush  3 mL Intravenous Q12H  . umeclidinium bromide  1 puff Inhalation Daily   Continuous Infusions: . sodium chloride 250 mL (06/04/20 0619)     LOS: 4 days     Enzo Bi, MD [facility] If 7PM-7AM, please contact night-coverage 06/06/2020, 6:01 AM

## 2020-06-05 NOTE — Care Management Important Message (Signed)
Important Message  Patient Details  Name: Christine Morris MRN: [id number] Date of Birth: Aug 20, 1940   Medicare Important Message Given:  Yes     Dannette Barbara 06/05/2020, 10:57 AM

## 2020-06-06 DIAGNOSIS — J9621 Acute and chronic respiratory failure with hypoxia: Secondary | ICD-10-CM | POA: Diagnosis not present

## 2020-06-06 DIAGNOSIS — J9622 Acute and chronic respiratory failure with hypercapnia: Secondary | ICD-10-CM | POA: Diagnosis not present

## 2020-06-06 LAB — CBC
HCT: 29.5 % — ABNORMAL LOW (ref 36.0–46.0)
Hemoglobin: 9.3 g/dL — ABNORMAL LOW (ref 12.0–15.0)
MCH: 28.2 pg (ref 26.0–34.0)
MCHC: 31.5 g/dL (ref 30.0–36.0)
MCV: 89.4 fL (ref 80.0–100.0)
Platelets: 394 10*3/uL (ref 150–400)
RBC: 3.3 MIL/uL — ABNORMAL LOW (ref 3.87–5.11)
RDW: 14.6 % (ref 11.5–15.5)
WBC: 10.7 10*3/uL — ABNORMAL HIGH (ref 4.0–10.5)
nRBC: 0 % (ref 0.0–0.2)

## 2020-06-06 LAB — BASIC METABOLIC PANEL
Anion gap: 8 (ref 5–15)
BUN: 32 mg/dL — ABNORMAL HIGH (ref 8–23)
CO2: 31 mmol/L (ref 22–32)
Calcium: 9.9 mg/dL (ref 8.9–10.3)
Chloride: 102 mmol/L (ref 98–111)
Creatinine, Ser: 0.89 mg/dL (ref 0.44–1.00)
GFR, Estimated: >60 mL/min (ref >60)
Glucose, Bld: 107 mg/dL — ABNORMAL HIGH (ref 70–99)
Potassium: 3.9 mmol/L (ref 3.5–5.1)
Sodium: 141 mmol/L (ref 135–145)

## 2020-06-06 LAB — SUSCEPTIBILITY, AER + ANAEROB: Source of Sample: 8680

## 2020-06-06 LAB — SUSCEPTIBILITY RESULT

## 2020-06-06 LAB — MAGNESIUM: Magnesium: 2 mg/dL (ref 1.7–2.4)

## 2020-06-06 MED ORDER — PREDNISONE 20 MG PO TABS
30.0000 mg | ORAL_TABLET | Freq: Every day | ORAL | Status: AC
  Administered 2020-06-10 – 2020-06-11 (×2): 30 mg via ORAL
  Filled 2020-06-06 (×2): qty 1

## 2020-06-06 MED ORDER — PREDNISONE 50 MG PO TABS
50.0000 mg | ORAL_TABLET | Freq: Every day | ORAL | Status: AC
  Administered 2020-06-07: 50 mg via ORAL
  Filled 2020-06-06: qty 1

## 2020-06-06 MED ORDER — PREDNISONE 20 MG PO TABS
40.0000 mg | ORAL_TABLET | Freq: Every day | ORAL | Status: AC
  Administered 2020-06-08 – 2020-06-09 (×2): 40 mg via ORAL
  Filled 2020-06-06 (×2): qty 2

## 2020-06-06 MED ORDER — PREDNISONE 10 MG PO TABS: 10.0000 mg | ORAL_TABLET | Freq: Every day | ORAL | Status: DC

## 2020-06-06 MED ORDER — PREDNISONE 20 MG PO TABS
20.0000 mg | ORAL_TABLET | Freq: Every day | ORAL | Status: DC
  Administered 2020-06-12: 20 mg via ORAL
  Filled 2020-06-06: qty 1

## 2020-06-06 NOTE — Progress Notes (Signed)
OT Cancellation Note  Patient Details Name: DEAMBER BUCKHALTER MRN: [id number] DOB: 1941/04/05   Cancelled Treatment:    Reason Eval/Treat Not Completed: Patient declined, no reason specified. OT continues to follow pt for therapy services. Pt received semi-supine in bed this date. She endorses nausea and politely declines functional activity this date. Therapist provides handout on energy conservation strategies to support pt safety and functional independence during meaningful occupations of daily life. Pt agreeable to review handout and discuss at next OT session. Will re-attempt at a later date/time as available and pt medically appropriate for OT tx session.   Shara Blazing, M.S., OTR/L Ascom: [phone] 06/06/20, 2:27 PM

## 2020-06-06 NOTE — Progress Notes (Signed)
Pulmonary Medicine          Date: 06/06/2020,   MRN# [id number] Christine Morris February 15, 1941     AdmissionWeight: 44.9 kg                 CurrentWeight: 45.2 kg      CHIEF COMPLAINT:   Mediastinal mass with LLL infiltrate   HISTORY OF PRESENT ILLNESS   Pleasant 80 year old female who was previously seen for worsening dyspnea and advanced COPD with chronic hypoxemia. Patient was last seen in June 2020. She is currently using oxygen 24 hours a day at 3 to 4 L/min. Previously she was on Incruse Ellipta as well as [employer], we had initiated DuoNeb nebulizer therapy as well as Roflumilast 250 mcg daily. Patient states she had initiated this therapy and feels slightly improved.   She continues to make thick tenacious phlegm however reports no problems expectorating it. Today we had discussed bronchopulmonary hygiene techniques including incentive spirometer to combat atelectasis and patient will have one ordered.   She also has bronchiectasis which is COPD related and remains high risk for pneumonia and hospitalization as in January 2020. We had also discussed using low-dose Zithromax thrice weekly for chronic suppressive therapy and patient is agreeable to this plan. I had encouraged patient to participate in graduated exercise program/pulmonary rehab. She denies constitutional symptoms.  08/02/19- O2 concentrator stopped working and she was able to notify [employer], they were kind enough to replace it with a new device on Monday.   12/12/19- patient states she had episodes of epistaxis, ironically she reports using flonase and this somehow has helped her.  Patient had CT chest 10/27/19 we reviewed this together, there is findings suggestive of atypical granulomatous infection such as MAC as well as med/hilar lymphadenopathy suggestive of possible neoplasm, we will obtian PET as recommended.   She stopped using her Inentive spirometer but we have encouraged her to use it more and  she is doing that.   02/14/20- patient was hospitalized for respiratory distress and syncope, she was thought to have possible arrythmia and was monitored with telemetry but subsequently improved and d/cd. She had findings of low BP. She reports staying well hydrated drinking water several times daily and also drinking pepsi. She has good appetite and is eating as well as using ensure shakes to supplement for nourishment but despite all this she has lost 3 more lbs. She hat PET scan we discussed in detail regardign RLL nodule and hilar lymphadenopathy, patient states she feels she will not be able to undergo biopsy or therapy for lung ca and wishes to wait for now and think about it with husband. In the interim we discussed modalities to remove phlegm such as addition of mucomyst to nebulizer regimen. She is to continue TOBI for bronchiectasis  Patient is here today for biopsy of mediastinal mass and airway inspection of lLL infiltrate with post obstructive process.    06/03/20- patient improved clinically she is now on 30Fio2 -BIPAP 12/6.  I have ordered ddimer to rule out dvt/pe and ordered US dvt LE study for today.  If negative on both will not order CTPE protocol since patient just had CT done to reduce radiation load as she does have oncology therapy planned.  Will obtain VBG and remove BIPAP today if able.  Reviewed care plan with patient and husband.  Secure message sent to respiratory therapist.    06/04/20- patient resting in bed, husband at bedside. She is on  2L [place], rhonchi bilaterally on auscultation.  We are in D/C planning stages of hospitalization. Weaning steroids today from 60 bid IV solumedrol to 40 with plan to transition to PO pred.  Antibiotics to PO levoquin 750 po daily for 3 more days to finish on outpatient.  Lasix 20 today for residual mild effusion and congestion on auscultation.  PT and OT today to expedite dc plan. Dicussed care plan with primary attending physician Dr Billie Ruddy.     06/05/20- patient is optimized for dc home.  S/p PT/OT and oncology evaluation.  Will follow up on outpatient basis.  May dc home on prednisone 71m with taper by 577mper day and levoquin 750 for 3 more days please.     06/06/20- patient sleeping in bed. Husband at bedside, we discussed care plan. She has been with nausea today. PT/OT following and plan for dc to rehab due to significant deconditioning.    PAST MEDICAL HISTORY   Past Medical History:  Diagnosis Date  . [place]ib ([facility]  . Anxiety   . Atrial fibrillation ([facility]  . Cervical stenosis of spine   . COPD (chronic obstructive pulmonary disease) ([facility]  . Depression   . Hyperlipidemia   . Hypothyroidism   . Nodule of right lung   . On supplemental oxygen by nasal cannula   . 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 Rupture of extensor tendon of finger   . Spondylosis of cervical region without myelopathy or radiculopathy   . Stroke ([facility]  . Vertigo   . Wartenberg syndrome      SURGICAL HISTORY   Past Surgical History:  Procedure Laterality Date  . ABDOMINAL HYSTERECTOMY    . APPENDECTOMY    . BREAST BIOPSY Left 1971  . BREAST CYST EXCISION Left 1971  . CATARACT SURGERY Bilateral   . LOOP RECORDER IMPLANT    . OOPHORECTOMY Bilateral   . VIDEO BRONCHOSCOPY WITH ENDOBRONCHIAL NAVIGATION N/A 05/30/2020   Procedure: VIDEO BRONCHOSCOPY WITH ENDOBRONCHIAL NAVIGATION;  Surgeon: AlOttie GlazierMD;  Location: [facility] ORS;  Service: Thoracic;  Laterality: N/A;  . VIDEO BRONCHOSCOPY WITH ENDOBRONCHIAL ULTRASOUND N/A 05/30/2020   Procedure: VIDEO BRONCHOSCOPY WITH ENDOBRONCHIAL ULTRASOUND;  Surgeon: AlOttie GlazierMD;  Location: [facility] ORS;  Service: Thoracic;  Laterality: N/A;     FAMILY HISTORY   History reviewed. No pertinent family history.   SOCIAL HISTORY   Social History   Tobacco Use  . Smoking status: Former SmResearch scientist (life sciences). Smokeless tobacco: Never Used  Vaping Use  . Vaping Use: Never used  Substance Use  Topics  . Alcohol use: Never  . Drug use: Never     MEDICATIONS    Home Medication:    Current Medication:  Current Facility-Administered Medications:  .  0.9 %  sodium chloride infusion, 250 mL, Intravenous, PRN, WoSi RaiderNoAilene RudMD, Last Rate: 10 mL/hr at 06/04/20 0619, 250 mL at 06/04/20 0619 .  albuterol (PROVENTIL) (2.5 MG/3ML) 0.083% nebulizer solution 2.5 mg, 2.5 mg, Nebulization, Q2H PRN, Wouk, NoAilene RudMD .  apixaban ([facility] Inctablet 5 mg, 5 mg, Oral, BID, WaRocky MorelRPH, 5 mg at 06/06/20 [id number]  escitalopram (LEXAPRO) tablet 20 mg, 20 mg, Oral, Daily, Wouk, NoAilene RudMD, 20 mg at 06/06/20 0926 .  fluticasone furoate-vilanterol (BREO ELLIPTA) 200-25 MCG/INH 1 puff, 1 puff, Inhalation, Daily, Wouk, NoAilene RudMD, 1 puff at 06/06/20 [phone]  furosemide (LASIX) injection 20 mg, 20 mg, Intravenous, Daily, Tykerria Mccubbins,  Teairra Millar, MD, 20 mg at 06/06/20 0923 .  ibuprofen (ADVIL) tablet 400 mg, 400 mg, Oral, Q6H PRN, Enzo Bi, MD, 400 mg at 06/05/20 1533 .  influenza vaccine adjuvanted (FLUAD) injection 0.5 mL, 0.5 mL, Intramuscular, Tomorrow-1000, Dolan, Carissa E, RPH .  ipratropium-albuterol (DUONEB) 0.5-2.5 (3) MG/3ML nebulizer solution 3 mL, 3 mL, Nebulization, TID, Enzo Bi, MD, 3 mL at 06/06/20 1344 .  levothyroxine (SYNTHROID, LEVOTHROID) injection 38 mcg, 38 mcg, Intravenous, Daily, Lang Snow, NP, 38 mcg at 06/06/20 0998 .  LORazepam (ATIVAN) injection 1 mg, 1 mg, Intravenous, Q6H PRN, Wouk, Ailene Rud, MD, 1 mg at 06/06/20 [phone] .  montelukast (SINGULAIR) tablet 10 mg, 10 mg, Oral, Toya Smothers, MD, 10 mg at 06/06/20 0927 .  ondansetron (ZOFRAN) injection 4 mg, 4 mg, Intravenous, Q4H PRN, Mansy, Jan A, MD, 4 mg at 06/06/20 1408 .  [START ON 06/07/2020] predniSONE (DELTASONE) tablet 50 mg, 50 mg, Oral, Q breakfast **FOLLOWED BY** [START ON 06/08/2020] predniSONE (DELTASONE) tablet 40 mg, 40 mg, Oral, Q breakfast **FOLLOWED BY** [START ON 06/10/2020]  predniSONE (DELTASONE) tablet 30 mg, 30 mg, Oral, Q breakfast **FOLLOWED BY** [START ON 06/12/2020] predniSONE (DELTASONE) tablet 20 mg, 20 mg, Oral, Q breakfast **FOLLOWED BY** [START ON 06/14/2020] predniSONE (DELTASONE) tablet 10 mg, 10 mg, Oral, Q breakfast, Shawna Clamp, MD .  roflumilast (DALIRESP) tablet 250 mcg, 250 mcg, Oral, Daily, Wouk, Ailene Rud, MD, 250 mcg at 06/06/20 0926 .  simvastatin (ZOCOR) tablet 40 mg, 40 mg, Oral, QPM, Wouk, Ailene Rud, MD, 40 mg at 06/05/20 1758 .  sodium chloride flush (NS) 0.9 % injection 3 mL, 3 mL, Intravenous, Q12H, Wouk, Ailene Rud, MD, 3 mL at 06/06/20 0927 .  sodium chloride flush (NS) 0.9 % injection 3 mL, 3 mL, Intravenous, PRN, Wouk, Ailene Rud, MD .  traZODone (DESYREL) tablet 25 mg, 25 mg, Oral, QHS PRN, Mansy, Jan A, MD .  umeclidinium bromide (INCRUSE ELLIPTA) 62.5 MCG/INH 1 puff, 1 puff, Inhalation, Daily, Wouk, Ailene Rud, MD, 1 puff at 06/06/20 5053    ALLERGIES   Rivaroxaban     REVIEW OF SYSTEMS    Review of Systems:  Gen:  Denies  fever, sweats, chills weigh loss  HEENT: Denies blurred vision, double vision, ear pain, eye pain, hearing loss, nose bleeds, sore throat Cardiac:  No dizziness, chest pain or heaviness, chest tightness,edema Resp:   Denies cough or sputum porduction, shortness of breath,wheezing, hemoptysis,  Gi: Denies swallowing difficulty, stomach pain, nausea or vomiting, diarrhea, constipation, bowel incontinence Gu:  Denies bladder incontinence, burning urine Ext:   Denies Joint pain, stiffness or swelling Skin: Denies  skin rash, easy bruising or bleeding or hives Endoc:  Denies polyuria, polydipsia , polyphagia or weight change Psych:   Denies depression, insomnia or hallucinations   Other:  All other systems negative   VS: BP (!) 118/59 (BP Location: Right Arm)   Pulse 71   Temp 98.1 F (36.7 C) (Oral)   Resp 18   Ht _0  (1.6 m)   Wt 45.2 kg   SpO2 97%   BMI 17.64 kg/m       PHYSICAL EXAM    GENERAL:NAD, no fevers, chills, no weakness no fatigue HEAD: Normocephalic, atraumatic.  EYES: Pupils equal, round, reactive to light. Extraocular muscles intact. No scleral icterus.  MOUTH: Moist mucosal membrane. Dentition intact. No abscess noted.  EAR, NOSE, THROAT: Clear without exudates. No external lesions.  NECK: Supple. No thyromegaly. No nodules. No JVD.  PULMONARY:  Decreased air entrly bilaterally CARDIOVASCULAR: S1 and S2. Regular rate and rhythm. No murmurs, rubs, or gallops. No edema. Pedal pulses 2+ bilaterally.  GASTROINTESTINAL: Soft, nontender, nondistended. No masses. Positive bowel sounds. No hepatosplenomegaly.  MUSCULOSKELETAL: No swelling, clubbing, or edema. Range of motion full in all extremities.  NEUROLOGIC: Cranial nerves II through XII are intact. No gross focal neurological deficits. Sensation intact. Reflexes intact.  SKIN: No ulceration, lesions, rashes, or cyanosis. Skin warm and dry. Turgor intact.  PSYCHIATRIC: Mood, affect within normal limits. The patient is awake, alert and oriented x 3. Insight, judgment intact.       IMAGING    DG Chest 1 View  Result Date: 06/02/2020 CLINICAL DATA:  Shortness of breath, right lobe biopsy performed Wednesday EXAM: CHEST  1 VIEW COMPARISON:  Radiograph 12/16/2019, CT 05/30/2020 FINDINGS: There is persistent atelectasis and/or consolidation in the left lung base with a left pleural effusion as was seen on comparison CT imaging 3 days prior. Some additional bronchitic and bronchiectatic changes are seen diffusely throughout the lungs with some chronically coarsened interstitial changes and reticulonodular opacities throughout the lungs in a similar distribution to more remote comparison. No new consolidative opacity. No pneumothorax. No convincing CT features of edema at this time. The aorta is calcified. The remaining cardiomediastinal contours are unremarkable. No acute osseous or soft tissue  abnormality. Degenerative changes are present in the imaged spine and shoulders. Telemetry leads and support devices overlie the chest. IMPRESSION: 1. Persistent atelectasis and/or consolidation in the left lung base with a left pleural effusion as was seen on comparison CT imaging. 2. Some chronic bronchitic/bronchiectatic changes with coarsened interstitium and diffuse reticulonodular opacities conspicuous for a long-standing/chronic atypical infection including mycobacterial etiologies. 3. No other acute cardiopulmonary abnormalities. 4. The aorta is calcified. The remaining cardiomediastinal contours are unremarkable. Electronically Signed   By: Lovena Le M.D.   On: 06/02/2020 05:39   CT CHEST WO CONTRAST  Result Date: 05/30/2020 CLINICAL DATA:  Shortness of breath.  Preop for bronchoscopy. EXAM: CT CHEST WITHOUT CONTRAST TECHNIQUE: Multidetector CT imaging of the chest was performed following the standard protocol without IV contrast. COMPARISON:  May 03, 2020. FINDINGS: Cardiovascular: Atherosclerosis of thoracic aorta is noted without aneurysm formation. Normal cardiac size. Small pericardial effusion is noted. Coronary artery calcifications are noted. Mediastinum/Nodes: As noted on prior exam, there is extensive mediastinal adenopathy present particularly in the right paratracheal pretracheal subcarinal and left hilar regions. This results in narrowing of the left mainstem bronchus. Thyroid gland is unremarkable. Esophagus is unremarkable. Lungs/Pleura: No pneumothorax is noted. Mild biapical scarring is noted. Mild to moderate left pleural effusion is noted with adjacent left lower lobe postobstructive atelectasis or infiltrate. Upper Abdomen: No acute abnormality. Musculoskeletal: No chest wall mass or suspicious bone lesions identified. IMPRESSION: 1. Mild to moderate left pleural effusion is noted with adjacent left lower lobe postobstructive atelectasis or infiltrate. 2. Small pericardial  effusion is noted. 3. Coronary artery calcifications are noted suggesting coronary artery disease. 4. As noted on prior exam, there is extensive mediastinal adenopathy present particularly in the right paratracheal, subcarinal and left hilar regions. This results in narrowing of the left mainstem bronchus. This is concerning for malignancy or metastatic disease. Aortic Atherosclerosis (ICD10-I70.0). Electronically Signed   By: Marijo Conception M.D.   On: 05/30/2020 09:32   MR BRAIN W WO CONTRAST  Result Date: 06/05/2020 CLINICAL DATA:  Small-cell lung cancer, staging EXAM: MRI HEAD WITHOUT AND WITH CONTRAST TECHNIQUE: Multiplanar, multiecho pulse sequences of  the brain and surrounding structures were obtained without and with intravenous contrast. CONTRAST:  51m GADAVIST GADOBUTROL 1 MMOL/ML IV SOLN COMPARISON:  May 2019 FINDINGS: Motion artifact is present. Findings below are within this limitation. Brain: There is no acute infarction or intracranial hemorrhage. There is no intracranial mass, mass effect, or edema. There is no hydrocephalus or extra-axial fluid collection. Prominence of the ventricles and sulci reflects generalized parenchymal volume loss. Small chronic infarcts of the right frontal lobe cortex and caudate. Additional patchy and confluent areas of T2 hyperintensity in the supratentorial and pontine white matter are nonspecific but probably reflect moderate chronic microvascular ischemic changes. Appearance is similar to the prior study. No abnormal enhancement. Vascular: Major vessel flow voids at the skull base are preserved. Skull and upper cervical spine: Normal marrow signal is preserved. Sinuses/Orbits: Paranasal sinuses are aerated. Orbits are unremarkable. Other: Sella is unremarkable.  Mastoid air cells are clear. IMPRESSION: Suboptimal evaluation due to motion artifact. No evidence of intracranial metastatic disease. Small chronic infarcts and moderate chronic microvascular ischemic  changes. Electronically Signed   By: PMacy MisM.D.   On: 06/05/2020 11:39   [place]Venous Img Lower Bilateral (DVT)  Result Date: 06/03/2020 CLINICAL DATA:  Shortness of breath EXAM: BILATERAL LOWER EXTREMITY VENOUS DOPPLER ULTRASOUND TECHNIQUE: Gray-scale sonography with compression, as well as color and duplex ultrasound, were performed to evaluate the deep venous system(s) from the level of the common femoral vein through the popliteal and proximal calf veins. COMPARISON:  None. FINDINGS: VENOUS Normal compressibility of the common femoral, superficial femoral, and popliteal veins, as well as the visualized calf veins. Visualized portions of profunda femoral vein and great saphenous vein unremarkable. No filling defects to suggest DVT on grayscale or color Doppler imaging. Doppler waveforms show normal direction of venous flow, normal respiratory plasticity and response to augmentation. Limited views of the contralateral common femoral vein are unremarkable. OTHER None. Limitations: none IMPRESSION: No acute deep vein thrombosis in the visualized bilateral lower extremities. Electronically Signed   By: SValentino SaxonMD   On: 06/03/2020 13:20   DG Chest Port 1 View  Result Date: 06/05/2020 CLINICAL DATA:  Shortness of breath EXAM: PORTABLE CHEST 1 VIEW COMPARISON:  06/02/2020 FINDINGS: Mild blunting of the left costophrenic angle is unchanged. Mild chronic bronchitic type changes. Calcific aortic atherosclerosis. There is left retrocardiac consolidation/atelectasis, unchanged. IMPRESSION: Unchanged left retrocardiac consolidation/atelectasis. Electronically Signed   By: KUlyses JarredM.D.   On: 06/05/2020 02:30          ASSESSMENT/PLAN    Acute on chronic hypoxemic respiratory failure Due to Acute moderate exacerbation of COPD   - agree with levoquin and steroids - have stepped down to bid solumedrol 469m>>prednisone with taper  - ddimer to r/o PE - mild elevation due to active  cancer  - dvt [place] r/o DVT- negativ e  -will consider PE study if above is abnormal   - called and spoke with daugther [name] today    Extensive stage small cell lung cancer   - new diagnosis and may be cause of current exacerbation as mediastinal mass effect is singificant   - Have contacted oncologist - Dr RaJanese Banksnd patient has follow up set up on outpatient.    Anxiety disorder NOS   - complicating respiratory status    - patient reports episodes of breathlessness and feelings of suffocation and drowing due to advanced chronic lung disease.    - she appreciates and responds well do TID PRN  ativan 95m.         Thank you for allowing me to participate in the care of this patient.    Patient/Family are satisfied with care plan and all questions have been answered.  This document was prepared using Dragon voice recognition software and may include unintentional dictation errors.     FOttie Glazier M.D.  Division of [facility]

## 2020-06-06 NOTE — Progress Notes (Signed)
Mobility Specialist - Progress Note   06/06/20 1122  Mobility  Activity Dangled on edge of bed  Level of Assistance Standby assist, set-up cues, supervision of patient - no hands on  Assistive Device Other (Comment) (bed rails)  Mobility Response Tolerated fair  Mobility performed by Mobility specialist  $Mobility charge 1 Mobility    Pt sleeping in bed upon arrival. Pt easily awakens. Pt states she's been "nauseous all night" but is still willing to participate in session. Pt able to get to EOB SBA. While dangling EOB for a few mins, pt c/o nausea getting worst. Deferred further mobility d/t nausea. Pt returned to supine SBA. In conclusion, pt tolerated session fair. Pt left laying in bed w/ alarm set and visitor present in room. Call bell and phone placed in reach. Nurse was notified.     Danielys Madry Mobility Specialist  06/06/20, 11:25 AM

## 2020-06-06 NOTE — Progress Notes (Signed)
PROGRESS NOTE    Christine Morris  FIE:[id number] DOB: November 22, 1940 DOA: 06/02/2020 PCP: Chase Picket, MD   Brief Narrative:  This 80 years old female with PMH significant for severe COPD on home oxygen 4 L, paroxysmal A. fib on DOAC, hypothyroidism, recent diagnosis of small cell lung cancer who presents with worsening shortness of breath.  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  Assessment & Plan:   Active Problems:   CVA (cerebral vascular accident) ([place])   AF (paroxysmal atrial fibrillation) (HCC)   COPD with chronic bronchitis (HCC)   Chronic respiratory failure with hypoxia (HCC)   COPD (chronic obstructive pulmonary disease) with acute bronchitis (HCC)   Acute on chronic respiratory failure (HCC)   Lung cancer (HCC)   COPD with acute exacerbation (HCC)   Acute on chronic hypoxic hypercapnic respiratory failure sec to COPD with acute exacerbation Patient presented with increased work of breathing, she was tachypneic,  She was requiring 5 L of supplemental oxygen to maintain saturation above 94%. Flu/covid neg.  Pt was put on BiPAP. Continue methylprednisone 60 iv q6, DuoNeb. Pulm consulted.  Recommended to continue Levaquin and Solu-Medrol Weaned off BiPAP and to 4L today. Start taperring from IV solumedrol to prednisone 50 mg daily Continue with scheduled DuoNeb Continue home breo, daliresp, incurse, singulair Continue supplemental O2 to keep sats between 88-92%, wean as tolerated  Sepsis 2/2 pseudomonas PNA Patient was found to have tachycardia, tachypnea, leukocytosis.  No fever, neg procal.  Bronch lavage positive for pseudomonas She was started onzosynand levaquin on admission (discussed w/ Raul Del who advises double coverage for now) --zosyn d/c'ed. Continue Levaquin.  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 Janese Banks to arrange chemo/RT --oupatient palliative care consult  Hypokalemia >>> resolved Hypomagnesemia >>> resolved  History CVA - cont simvastatin  Parosyxmal a fib Here in sinus cont home Eliquis  Hypothyroidism - cont home synthroid  MDD - cont home lexapro  Anxiety --complicating respiratory status -- Continue IV ativan 34m q6h PRN   DVT prophylaxis: Eliquis Code Status: Full code Family Communication: No family at bedside Disposition Plan:  Status is: Inpatient  Remains inpatient appropriate because:Inpatient level of care appropriate due to severity of illness   Dispo: The patient is from: Home              Anticipated d/c is to: Home              Anticipated d/c date is: > 3 days              Patient currently is not medically stable to d/c.  Consultants:   Pulmonology  Procedures: Antimicrobials:  Anti-infectives (From admission, onward)   Start     Dose/Rate Route Frequency Ordered Stop   06/04/20 1300  levofloxacin (LEVAQUIN) tablet 750 mg        750 mg Oral Daily 06/04/20 1204 06/06/20 0924   06/03/20 0600  levofloxacin (LEVAQUIN) IVPB 750 mg  Status:  Discontinued        750 mg 100 mL/hr over 90 Minutes Intravenous Every 24 hours 06/02/20 1357 06/02/20 1529   06/03/20 0600  levofloxacin (LEVAQUIN) IVPB 750 mg  Status:  Discontinued        750 mg 100 mL/hr over 90 Minutes Intravenous Every 48 hours 06/02/20 1529 06/04/20 1203   06/02/20 0830  piperacillin-tazobactam (  ZOSYN) IVPB 3.375 g  Status:  Discontinued        3.375 g 12.5 mL/hr over 240 Minutes Intravenous Every 8 hours 06/02/20 0823 06/04/20 1152   06/02/20 0515  cefTRIAXone (ROCEPHIN) 1 g in sodium chloride 0.9 % 100 mL IVPB        1 g 200 mL/hr over 30 Minutes Intravenous  Once 06/02/20 0510 06/02/20 0613   06/02/20 0515  azithromycin (ZITHROMAX) 500 mg in sodium chloride 0.9 % 250 mL IVPB        500 mg 250 mL/hr over 60 Minutes  Intravenous  Once 06/02/20 0510 06/02/20 0800     Subjective: Patient was seen and examined at bedside.  She feels better,  breathing is improved.  She is on 3 L saturation is 93%.  She denies any chest pain,  PT/ OT recommended SNF.  Objective: Vitals:   06/06/20 0834 06/06/20 1144 06/06/20 1345 06/06/20 1618  BP: 101/72 99/73  (!) 118/59  Pulse: 92 94  71  Resp: _0 Temp: 97.8 F (36.6 C) 98.2 F (36.8 C)  98.1 F (36.7 C)  TempSrc: Oral Axillary  Oral  SpO2: 97% 98% 98% 97%  Weight:      Height:        Intake/Output Summary (Last 24 hours) at 06/06/2020 1640 Last data filed at 06/06/2020 0900 Gross per 24 hour  Intake 240 ml  Output 650 ml  Net -410 ml   Filed Weights   06/04/20 0400 06/05/20 0331 06/06/20 0500  Weight: 46.1 kg 43 kg 45.2 kg    Examination:  General exam: Appears calm and comfortable, not in any acute distress. Respiratory system: Clear to auscultation. Respiratory effort normal. Cardiovascular system: S1 & S2 heard, RRR. No JVD, murmurs, rubs, gallops or clicks. No pedal edema. Gastrointestinal system: Abdomen is nondistended, soft and nontender. No organomegaly or masses felt. Normal bowel sounds heard. Central nervous system: Alert and oriented. No focal neurological deficits. Extremities: Symmetric 5 x 5 power.  No edema, no cyanosis, no clubbing. Skin: No rashes, lesions or ulcers Psychiatry: Judgement and insight appear normal. Mood & affect appropriate.     Data Reviewed: I have personally reviewed following labs and imaging studies  CBC: Recent Labs  Lab 06/02/20 0451 06/03/20 0559 06/04/20 0532 06/05/20 0449 06/06/20 0625  WBC 16.0* 11.5* 13.5* 10.3 10.7*  NEUTROABS 13.6*  --   --   --   --   HGB 8.9* 8.5* 8.7* 8.5* 9.3*  HCT 30.2* 27.4* 27.9* 27.2* 29.5*  MCV 95.3 94.2 92.1 90.7 89.4  PLT 441* 358 357 391 703   Basic Metabolic Panel: Recent Labs  Lab 06/02/20 0451 06/02/20 1738 06/03/20 0559 06/04/20 0532  06/05/20 0449 06/06/20 0625  NA 142 142 143 142 138 141  K 2.7* 5.2* 4.5 4.5 4.2 3.9  CL 102 109 110 107 99 102  CO2 _1 GLUCOSE 127* 159* 139* 103* 113* 107*  BUN _2 25* 25* 32*  CREATININE 0.61 0.60 0.65 0.68 0.72 0.89  CALCIUM 9.6 9.2 9.5 9.8 10.2 9.9  MG 1.6*  --  2.1 2.1 1.9 2.0   GFR: Estimated Creatinine Clearance: 37.2 mL/min (by C-G formula based on SCr of 0.89 mg/dL). Liver Function Tests: No results for input(s): AST, ALT, ALKPHOS, BILITOT, PROT, ALBUMIN in the last 168 hours. No results for input(s): LIPASE, AMYLASE in the last 168 hours. No results for input(s): AMMONIA in the last 168  hours. Coagulation Profile: Recent Labs  Lab 06/02/20 0451  INR 1.2   Cardiac Enzymes: No results for input(s): CKTOTAL, CKMB, CKMBINDEX, TROPONINI in the last 168 hours. BNP (last 3 results) No results for input(s): PROBNP in the last 8760 hours. HbA1C: No results for input(s): HGBA1C in the last 72 hours. CBG: No results for input(s): GLUCAP in the last 168 hours. Lipid Profile: No results for input(s): CHOL, HDL, LDLCALC, TRIG, CHOLHDL, LDLDIRECT in the last 72 hours. Thyroid Function Tests: No results for input(s): TSH, T4TOTAL, FREET4, T3FREE, THYROIDAB in the last 72 hours. Anemia Panel: No results for input(s): VITAMINB12, FOLATE, FERRITIN, TIBC, IRON, RETICCTPCT in the last 72 hours. Sepsis Labs: Recent Labs  Lab 06/02/20 0451  PROCALCITON <0.10  LATICACIDVEN 1.9    Recent Results (from the past 240 hour(s))  SARS CORONAVIRUS 2 (TAT 6-24 HRS) Nasopharyngeal Nasopharyngeal Swab     Status: None   Collection Time: 05/28/20  3:28 PM   Specimen: Nasopharyngeal Swab  Result Value Ref Range Status   SARS Coronavirus 2 NEGATIVE NEGATIVE Final    Comment: (NOTE) SARS-CoV-2 target nucleic acids are NOT DETECTED.  The SARS-CoV-2 RNA is generally detectable in upper and lower respiratory specimens during the acute phase of infection.  Negative results do not preclude SARS-CoV-2 infection, do not rule out co-infections with other pathogens, and should not be used as the sole basis for treatment or other patient management decisions. Negative results must be combined with clinical observations, patient history, and epidemiological information. The expected result is Negative.  Fact Sheet for Patients: SugarRoll.be  Fact Sheet for Healthcare Providers: https://www.woods-mathews.com/  This test is not yet approved or cleared by the [place] FDA and  has been authorized for detection and/or diagnosis of SARS-CoV-2 by FDA under an Emergency Use Authorization (EUA). This EUA will remain  in effect (meaning this test can be used) for the duration of the COVID-19 declaration under Se ction 564(b)(1) of the Act, 21 U.S.C. section 360bbb-3(b)(1), unless the authorization is terminated or revoked sooner.  Performed at [facility], [address], [place], [place] [address]   Culture, respiratory     Status: None (Preliminary result)   Collection Time: 05/30/20  2:53 PM   Specimen: Bronchial Wash; Respiratory  Result Value Ref Range Status   Specimen Description   Final    BRONCHIAL WASHINGS Performed at [facility], [address], [place], [place] [address]    Special Requests   Final    NONE Performed at [facility], [address] [place], [place] [address]    Gram Stain   Final    MODERATE WBC PRESENT, PREDOMINANTLY PMN MODERATE GRAM NEGATIVE RODS    Culture   Final    MODERATE PSEUDOMONAS AERUGINOSA Sent to [facility] for further susceptibility testing. Performed at [facility], [place] [address] [place], [place] [address]    Report Status PENDING  Incomplete  Acid Fast Smear (AFB)     Status: None   Collection Time: 05/30/20  2:53 PM   Specimen: Bronchoalveolar Lavage; Respiratory  Result Value Ref Range Status   AFB Specimen  Processing Concentration  Final   Acid Fast Smear Negative  Final    Comment: (NOTE) Performed At: [facility] [place], [place] [id number] Rush Farmer MD UD:[id number]    Source (AFB) BRONCHIAL ALVEOLAR LAVAGE  Final    Comment: Performed at [facility] [address] [place], [place] [address]  [place]  prep     Status: None   Collection Time: 05/30/20  2:53 PM   Specimen: Bronchoalveolar Lavage  Result Value Ref Range Status   Specimen Description BRONCHIAL ALVEOLAR LAVAGE  Final   Special Requests NONE  Final   KOH Prep   Final    NO YEAST OR FUNGAL ELEMENTS SEEN Performed at [facility], [address] [place], [place] [address]    Report Status 05/30/2020 FINAL  Final  Aspergillus Ag, BAL/Serum     Status: None   Collection Time: 05/30/20  2:53 PM   Specimen: Bronchial Alveolar Lavage  Result Value Ref Range Status   Aspergillus Ag, BAL/Serum 0.05 0.00 - 0.49 Index Final    Comment: (NOTE) Performed At: [facility] [place], [place] [id number] Rush Farmer MD [id number]   Anaerobic culture     Status: None   Collection Time: 05/30/20  2:53 PM   Specimen: Bronchial Wash; Lung  Result Value Ref Range Status   Specimen Description   Final    BRONCHIAL WASHINGS Performed at [facility], [address], [place], [place] [address]    Special Requests   Final    NONE Performed at [facility], [address] [place], [place] [address]    Culture   Final    NO ANAEROBES ISOLATED Performed at [facility], [place] [address], [place], [place] [address]    Report Status 06/05/2020 FINAL  Final  Fungus Culture With Stain     Status: None (Preliminary result)   Collection Time: 05/30/20  2:53 PM  Result Value Ref Range Status   Fungus Stain Final report  Final    Comment: (NOTE) Performed At: [facility] [place], [place] [id number] Rush Farmer MD  [id number]    Fungus (Mycology) Culture PENDING  Incomplete   Fungal Source BRONCHIAL ALVEOLAR LAVAGE  Final    Comment: Performed at [facility], [place] [address] [place], [place] [address]  Susceptibility, Aer + Anaerob     Status: Abnormal   Collection Time: 05/30/20  2:53 PM  Result Value Ref Range Status   Suscept, Aer + Anaerob Final report (A)  Final    Comment: (NOTE) Performed At: [facility] [place], [place] [id number] Rush Farmer MD [id number]    Source of Sample [phone]  Final    Comment: SUSCEPTIBILITY FOR P.AERUGINOSA BRONCHIAL [facility] Performed at [facility], [place] [address] [place], [place] [address]   Fungus Culture Result     Status: None   Collection Time: 05/30/20  2:53 PM  Result Value Ref Range Status   Result 1 Comment  Final    Comment: (NOTE) KOH/Calcofluor preparation:  no fungus observed. Performed At: [facility] [place], [place] [id number] Rush Farmer MD [id number]   Susceptibility Result     Status: Abnormal   Collection Time: 05/30/20  2:53 PM  Result Value Ref Range Status   Suscept Result 1 Comment (A)  Final    Comment: (NOTE) Pseudomonas aeruginosa Identification performed by account, not confirmed by this laboratory.    Antimicrobial Suscept Comment  Final    Comment: (NOTE)      ** S = Susceptible; I = Intermediate; R = Resistant **                   P = Positive; N = Negative  MICS are expressed in micrograms per mL   Antibiotic                 RSLT#1    RSLT#2    RSLT#3    RSLT#4 Amikacin                       I Cefepime                       S Ceftazidime                    S Ciprofloxacin                  S Gentamicin                     I Imipenem                       S Levofloxacin                   S Meropenem                      S Piperacillin                   S Ticarcillin                    S Tobramycin                      S Performed At: [facility] [employer] [address] [place], [place] [id number] Rush Farmer MD [id number]   Blood culture (routine x 2)     Status: None (Preliminary result)   Collection Time: 06/02/20  4:51 AM   Specimen: BLOOD  Result Value Ref Range Status   Specimen Description BLOOD RIGHT ANTECUBITAL  Final   Special Requests   Final    BOTTLES DRAWN AEROBIC AND ANAEROBIC Blood Culture adequate volume   Culture   Final    NO GROWTH 4 DAYS Performed at [facility], [address], [place], [place] [address]    Report Status PENDING  Incomplete  Blood culture (routine x 2)     Status: None (Preliminary result)   Collection Time: 06/02/20  4:51 AM   Specimen: BLOOD  Result Value Ref Range Status   Specimen Description BLOOD BLOOD RIGHT FOREARM  Final   Special Requests   Final    BOTTLES DRAWN AEROBIC AND ANAEROBIC Blood Culture results may not be optimal due to an excessive volume of blood received in culture bottles   Culture   Final    NO GROWTH 4 DAYS Performed at [facility], [address], [place],  [address]    Report Status PENDING  Incomplete  Resp Panel by RT-PCR (Flu A&B, Covid) Nasopharyngeal Swab     Status: None   Collection Time: 06/02/20  4:51 AM   Specimen: Nasopharyngeal Swab; Nasopharyngeal(NP) swabs in vial transport medium  Result Value Ref Range Status   SARS Coronavirus 2 by RT PCR NEGATIVE NEGATIVE Final    Comment: (NOTE) SARS-CoV-2 target nucleic acids are NOT DETECTED.  The SARS-CoV-2 RNA is generally detectable in upper respiratory specimens during the acute phase of infection. The lowest concentration of SARS-CoV-2 viral copies this assay can detect is 138 copies/mL. A negative result does not preclude SARS-Cov-2 infection  and should not be used as the sole basis for treatment or other patient management decisions. A negative result may occur with  improper specimen collection/handling, submission of  specimen other than nasopharyngeal swab, presence of viral mutation(s) within the areas targeted by this assay, and inadequate number of viral copies(<138 copies/mL). A negative result must be combined with clinical observations, patient history, and epidemiological information. The expected result is Negative.  Fact Sheet for Patients:  EntrepreneurPulse.com.au  Fact Sheet for Healthcare Providers:  IncredibleEmployment.be  This test is no t yet approved or cleared by the [place] FDA and  has been authorized for detection and/or diagnosis of SARS-CoV-2 by FDA under an Emergency Use Authorization (EUA). This EUA will remain  in effect (meaning this test can be used) for the duration of the COVID-19 declaration under Section 564(b)(1) of the Act, 21 U.S.C.section 360bbb-3(b)(1), unless the authorization is terminated  or revoked sooner.       Influenza A by PCR NEGATIVE NEGATIVE Final   Influenza B by PCR NEGATIVE NEGATIVE Final    Comment: (NOTE) The Xpert Xpress SARS-CoV-2/FLU/RSV plus assay is intended as an aid in the diagnosis of influenza from Nasopharyngeal swab specimens and should not be used as a sole basis for treatment. Nasal washings and aspirates are unacceptable for Xpert Xpress SARS-CoV-2/FLU/RSV testing.  Fact Sheet for Patients: EntrepreneurPulse.com.au  Fact Sheet for Healthcare Providers: IncredibleEmployment.be  This test is not yet approved or cleared by the [place] FDA and has been authorized for detection and/or diagnosis of SARS-CoV-2 by FDA under an Emergency Use Authorization (EUA). This EUA will remain in effect (meaning this test can be used) for the duration of the COVID-19 declaration under Section 564(b)(1) of the Act, 21 U.S.C. section 360bbb-3(b)(1), unless the authorization is terminated or revoked.  Performed at [facility], [address], [place], [place] [address]          Radiology Studies: MR BRAIN W WO CONTRAST  Result Date: 06/05/2020 CLINICAL DATA:  Small-cell lung cancer, staging EXAM: MRI HEAD WITHOUT AND WITH CONTRAST TECHNIQUE: Multiplanar, multiecho pulse sequences of the brain and surrounding structures were obtained without and with intravenous contrast. CONTRAST:  51m GADAVIST GADOBUTROL 1 MMOL/ML IV SOLN COMPARISON:  May 2019 FINDINGS: Motion artifact is present. Findings below are within this limitation. Brain: There is no acute infarction or intracranial hemorrhage. There is no intracranial mass, mass effect, or edema. There is no hydrocephalus or extra-axial fluid collection. Prominence of the ventricles and sulci reflects generalized parenchymal volume loss. Small chronic infarcts of the right frontal lobe cortex and caudate. Additional patchy and confluent areas of T2 hyperintensity in the supratentorial and pontine white matter are nonspecific but probably reflect moderate chronic microvascular ischemic changes. Appearance is similar to the prior study. No abnormal enhancement. Vascular: Major vessel flow voids at the skull base are preserved. Skull and upper cervical spine: Normal marrow signal is preserved. Sinuses/Orbits: Paranasal sinuses are aerated. Orbits are unremarkable. Other: Sella is unremarkable.  Mastoid air cells are clear. IMPRESSION: Suboptimal evaluation due to motion artifact. No evidence of intracranial metastatic disease. Small chronic infarcts and moderate chronic microvascular ischemic changes. Electronically Signed   By: PMacy MisM.D.   On: 06/05/2020 11:39   DG Chest Port 1 View  Result Date: 06/05/2020 CLINICAL DATA:  Shortness of breath EXAM: PORTABLE CHEST 1 VIEW COMPARISON:  06/02/2020 FINDINGS: Mild blunting of the left costophrenic angle is unchanged. Mild chronic bronchitic type changes. Calcific aortic atherosclerosis. There  is left retrocardiac consolidation/atelectasis,  unchanged. IMPRESSION: Unchanged left retrocardiac consolidation/atelectasis. Electronically Signed   By: Ulyses Jarred M.D.   On: 06/05/2020 02:30    Scheduled Meds: . apixaban  5 mg Oral BID  . escitalopram  20 mg Oral Daily  . fluticasone furoate-vilanterol  1 puff Inhalation Daily  . furosemide  20 mg Intravenous Daily  . influenza vaccine adjuvanted  0.5 mL Intramuscular Tomorrow-1000  . ipratropium-albuterol  3 mL Nebulization TID  . levothyroxine  38 mcg Intravenous Daily  . montelukast  10 mg Oral BH-q7a  . [START ON 06/07/2020] predniSONE  50 mg Oral Q breakfast   Followed by  . [START ON 06/08/2020] predniSONE  40 mg Oral Q breakfast   Followed by  . [START ON 06/10/2020] predniSONE  30 mg Oral Q breakfast   Followed by  . [START ON 06/12/2020] predniSONE  20 mg Oral Q breakfast   Followed by  . [START ON 06/14/2020] predniSONE  10 mg Oral Q breakfast  . roflumilast  250 mcg Oral Daily  . simvastatin  40 mg Oral QPM  . sodium chloride flush  3 mL Intravenous Q12H  . umeclidinium bromide  1 puff Inhalation Daily   Continuous Infusions: . sodium chloride 250 mL (06/04/20 0619)     LOS: 4 days    Time spent: 25 mins    Keyasha Miah, MD [facility]   If 7PM-7AM, please contact night-coverage

## 2020-06-06 NOTE — Progress Notes (Signed)
PT Cancellation Note  Patient Details Name: Christine Morris MRN: [id number] DOB: September 13, 1940   Cancelled Treatment:     Therapist in to see pt who continued to c/o nausea limiting tolerance for activity on this date.   Josie Dixon 06/06/2020, 12:16 PM

## 2020-06-07 ENCOUNTER — Other Ambulatory Visit: Payer: Medicare Other

## 2020-06-07 DIAGNOSIS — J9621 Acute and chronic respiratory failure with hypoxia: Secondary | ICD-10-CM | POA: Diagnosis not present

## 2020-06-07 DIAGNOSIS — J9622 Acute and chronic respiratory failure with hypercapnia: Secondary | ICD-10-CM | POA: Diagnosis not present

## 2020-06-07 LAB — COMPREHENSIVE METABOLIC PANEL
ALT: 13 U/L (ref 0–44)
AST: 18 U/L (ref 15–41)
Albumin: 3 g/dL — ABNORMAL LOW (ref 3.5–5.0)
Alkaline Phosphatase: 31 U/L — ABNORMAL LOW (ref 38–126)
Anion gap: 11 (ref 5–15)
BUN: 33 mg/dL — ABNORMAL HIGH (ref 8–23)
CO2: 28 mmol/L (ref 22–32)
Calcium: 9.8 mg/dL (ref 8.9–10.3)
Chloride: 99 mmol/L (ref 98–111)
Creatinine, Ser: 0.73 mg/dL (ref 0.44–1.00)
GFR, Estimated: >60 mL/min (ref >60)
Glucose, Bld: 111 mg/dL — ABNORMAL HIGH (ref 70–99)
Potassium: 3.3 mmol/L — ABNORMAL LOW (ref 3.5–5.1)
Sodium: 138 mmol/L (ref 135–145)
Total Bilirubin: 0.4 mg/dL (ref 0.3–1.2)
Total Protein: 6.5 g/dL (ref 6.5–8.1)

## 2020-06-07 LAB — CULTURE, BLOOD (ROUTINE X 2)
Culture: NO GROWTH
Culture: NO GROWTH
Special Requests: ADEQUATE

## 2020-06-07 LAB — CBC
HCT: 30.3 % — ABNORMAL LOW (ref 36.0–46.0)
Hemoglobin: 9.6 g/dL — ABNORMAL LOW (ref 12.0–15.0)
MCH: 28.3 pg (ref 26.0–34.0)
MCHC: 31.7 g/dL (ref 30.0–36.0)
MCV: 89.4 fL (ref 80.0–100.0)
Platelets: 405 10*3/uL — ABNORMAL HIGH (ref 150–400)
RBC: 3.39 MIL/uL — ABNORMAL LOW (ref 3.87–5.11)
RDW: 14.5 % (ref 11.5–15.5)
WBC: 9.7 10*3/uL (ref 4.0–10.5)
nRBC: 0 % (ref 0.0–0.2)

## 2020-06-07 LAB — MAGNESIUM: Magnesium: 2 mg/dL (ref 1.7–2.4)

## 2020-06-07 LAB — PHOSPHORUS: Phosphorus: 2.1 mg/dL — ABNORMAL LOW (ref 2.5–4.6)

## 2020-06-07 MED ORDER — K PHOS MONO-SOD PHOS DI & MONO 155-852-130 MG PO TABS
250.0000 mg | ORAL_TABLET | Freq: Three times a day (TID) | ORAL | Status: AC
  Administered 2020-06-07 – 2020-06-08 (×2): 250 mg via ORAL
  Filled 2020-06-07 (×9): qty 1

## 2020-06-07 MED ORDER — POTASSIUM CHLORIDE 20 MEQ PO PACK
40.0000 meq | PACK | Freq: Once | ORAL | Status: AC
  Administered 2020-06-07: 40 meq via ORAL
  Filled 2020-06-07: qty 2

## 2020-06-07 MED ORDER — PROMETHAZINE HCL 25 MG/ML IJ SOLN
6.2500 mg | Freq: Two times a day (BID) | INTRAMUSCULAR | Status: DC
  Administered 2020-06-07 – 2020-06-12 (×10): 6.25 mg via INTRAVENOUS
  Filled 2020-06-07 (×11): qty 1

## 2020-06-07 MED ORDER — SCOPOLAMINE 1 MG/3DAYS TD PT72
1.0000 | MEDICATED_PATCH | TRANSDERMAL | Status: DC
  Administered 2020-06-07 – 2020-06-10 (×2): 1.5 mg via TRANSDERMAL
  Filled 2020-06-07 (×2): qty 1

## 2020-06-07 NOTE — Progress Notes (Signed)
Tumor Board Documentation  STATIA BURDICK was presented by Dr Janese Banks at our Tumor Board on 06/07/2020, which included representatives from medical oncology,radiation oncology,internal medicine,navigation,pathology,radiology,surgical,pharmacy,genetics,research,palliative care,pulmonology.  Charnette currently presents as a new patient,for MDC,for new positive pathology with history of the following treatments: surgical intervention(s).  Additionally, we reviewed previous medical and familial history, history of present illness, and recent lab results along with all available histopathologic and imaging studies. The tumor board considered available treatment options and made the following recommendations: Radiation therapy (primary modality),Additional screening (PET Scan as outpatient) Possibly chemotherapy  The following procedures/referrals were also placed: No orders of the defined types were placed in this encounter.   Clinical Trial Status: not discussed   Staging used: To be determined  AJCC Staging:       Group: At least Stage III Small Cell Lung caner   National site-specific guidelines   were discussed with respect to the case.  Tumor board is a meeting of clinicians from various specialty areas who evaluate and discuss patients for whom a multidisciplinary approach is being considered. Final determinations in the plan of care are those of the provider(s). The responsibility for follow up of recommendations given during tumor board is that of the provider.   Today's extended care, comprehensive team conference, Brendaly was not present for the discussion and was not examined.   Multidisciplinary Tumor Board is a multidisciplinary case peer review process.  Decisions discussed in the Multidisciplinary Tumor Board reflect the opinions of the specialists present at the conference without having examined the patient.  Ultimately, treatment and diagnostic decisions rest with the primary  provider(s) and the patient.

## 2020-06-07 NOTE — Progress Notes (Signed)
Mobility Specialist - Progress Note   06/07/20 1259  Mobility  Activity Refused mobility  Mobility performed by Mobility specialist    Pt refused session at this time. States she feels "too lousy" and has already been working with therapy. Encouraged pt to at least dangle  EOB. Pt declined. States she does not want to do anything at the moment. Will re-attempt at a later date/time.    Gabreille Dardis Mobility Specialist  06/07/20, 1:01 PM

## 2020-06-07 NOTE — Progress Notes (Signed)
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    PT Comments    Treatment limited today as pt declined participation with OOB secondary to c/o moderate nausea despite being pre-medicated. However, pt agreeable to participate in supine BLE therex. Pt continues to demonstrate generalized weakness and decreased activity tolerance as she required increased assistance for SLR and ankle pumps with fatigue. Pt required max assist for repositioning in bed but was able to assist through BLE in hooklying position. Pt will continue to benefit from skilled acute PT services to address deficits for return to baseline function. Will continue to recommend SNF at DC. However, pt may improve to HHPT if respiratory status and severity of nausea improve.   Follow Up Recommendations  SNF (may improve to HHPT if respiratory status and nausea improves)     Equipment Recommendations  Rolling walker with 5" wheels;Wheelchair cushion (measurements PT);Wheelchair (measurements PT);3in1 (PT)    Recommendations for Other Services       Precautions / Restrictions Precautions Precautions: Fall Restrictions Weight Bearing Restrictions: No    Mobility  Bed Mobility               General bed mobility comments: Max assist for supine scoot towards HOB for repositioning. Pt able to assist through BLE in hooklying position.  Verbal cues provided for sequencing and set up to ensure safety with mobility.  Transfers                 General transfer comment: Pt declining OOB mobility today secondary to nausea  Ambulation/Gait                     Balance       Sitting balance - Comments: Pt declining OOB mobility today secondary to c/o nausea                                    Cognition Arousal/Alertness: Awake/alert Behavior During Therapy: WFL for tasks assessed/performed;Anxious Overall Cognitive Status: Within Functional Limits for tasks assessed                                 General Comments: Pt A&O and able to follow 100% of 2-step commands      Exercises Other Exercises Other Exercises: Pt declining OOB mobility today secondary to c/o moderate nausea. However, pt agreeable for supine therex. Pt able to participate in x15 BLE exercises including: ankle pumps, SAQ, glute sets, quad sets, heel slides, SLR, hip ABD/ADD, and isometric hip ADD. Increased assistance required for SLR and AP due to weakness. Other Exercises: Max assist required for supine scooting towards [facility] for repositioning at end of session. Pt able to assist with BLE in hooklying position. Verbal cues provided for sequencing and set up to ensure safety with mobility.  General Comments General comments (skin integrity, edema, etc.): Pt declining OOB mobility today secondary to c/o nausea      Pertinent Vitals/Pain Pain Assessment: No/denies pain (C/o moderate nausea)    Home Living                      Prior Function            PT Goals (current goals can now be found in the care plan section) Acute Rehab PT Goals Patient Stated Goal: to go home PT Goal Formulation: With patient Time For Goal Achievement: 06/18/20 Potential to Achieve Goals: Fair Progress towards PT goals: Not progressing toward goals - comment (secondary to nausea)    Frequency    Min  2X/week      PT Plan Current plan remains appropriate    Co-evaluation              AM-PAC PT "6 Clicks" Mobility   Outcome Measure  Help needed turning from your back to your side while in a flat bed without using bedrails?: None Help needed moving from lying on your back to sitting on the side of a flat bed without using bedrails?: None Help needed moving to and from a bed to a chair (including a wheelchair)?: A Little Help needed standing up from a chair using your arms (e.g., wheelchair or bedside chair)?: A Little Help needed to walk in hospital room?: A Lot Help needed climbing 3-5 steps with a railing? : A Lot 6 Click Score: 18    End of Session Equipment Utilized During Treatment: Oxygen (3 LPM) Activity Tolerance: Patient tolerated treatment well;Other (comment) (Limited by nausea) Patient left: in bed;with call bell/phone within reach;with bed alarm set;with family/visitor present   PT Visit Diagnosis: Muscle weakness (generalized) (M62.81);Difficulty in walking, not elsewhere classified (R26.2)     Time: 7471-8550 PT Time Calculation (min) (ACUTE ONLY): 19 min  Charges:  $Therapeutic Exercise: 8-22 mins                     Herminio Commons, PT, DPT 10:14 AM,06/07/20

## 2020-06-07 NOTE — TOC Progression Note (Signed)
Transition of Care [facility]) - Progression Note    Patient Details  Name: Christine Morris MRN: [id number] Date of Birth: [birth year]  Transition of Care [facility]) CM/SW [place], RN Phone Number: 06/07/2020, 1:11 PM  Clinical Narrative:  Spoke with patient and her Husband who was at the bedside about SNF. Patient agrees to go to Rehab. Feels she will do better then going home. Husband agrees also.          Expected Discharge Plan and Services                                                 Social Determinants of Health (SDOH) Interventions    Readmission Risk Interventions No flowsheet data found.

## 2020-06-07 NOTE — Progress Notes (Signed)
Occupational Therapy Treatment Patient Details Name: Christine Morris MRN: [id number] DOB: Jul 18, 1940 Today's Date: 06/07/2020    History of present illness Pt is a 80 y/o F with PMH: chronic hypoxic RF, advanced COPD (on 4-5L at baseline), bronchiectasis, Afib on Eliquis, and  known b/l small pleural effusions (s/p recent lung biopsy on Weds 1/5) who presents via EMS from home for assessment of acute onset of shortness of breath associate with increase in cough; admitted for management of acute/chronic respiratory failured due to COPD exacerbation.  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   OT comments  Christine Morris presents today with increased weakness and very limited endurance. She states she has no pain and no SOB; her O2 sats remain at 90% on 3L O2 via [place]. She does complain of nausea, which she said has been almost constant for several days. Her husband is present in the room and frequently interrupted the session as well as jumped in to answer questions directed to the pt. With encouragement, pt able to engage in bed mobility, LB dressing, and limited standing, but required frequent rest breaks and greatly increased time; she was able to tolerate standing for < 1 minute before becoming "too tired" to remain upright. Given pt's limited endurance and generalized weakness, recommend D/C to SNF. Discussed this with pt, and she was in agreement that she was not strong enough to return home at present.    Follow Up Recommendations  SNF    Equipment Recommendations       Recommendations for Other Services      Precautions / Restrictions Precautions Precautions: Fall Restrictions Weight Bearing Restrictions: No       Mobility Bed Mobility Overal bed mobility: Needs Assistance Bed Mobility: Supine to Sit;Sit to Supine     Supine to sit: Min assist Sit to supine: Min assist   General bed mobility comments: Min A for bed mobility,  with greatly increased time. Required a rest break during sit<supine. Min A required for supine scooting towards HOB -- pt uses LE to assist.  Transfers Overall transfer level: Needs assistance Equipment used: Rolling walker (2 wheeled) Transfers: Sit to/from Stand Sit to Stand: Min assist         General transfer comment: Pt declining OOB mobility today secondary to nausea    Balance Overall balance assessment: Needs assistance Sitting-balance support: No upper extremity supported;Feet supported Sitting balance-Leahy Scale: Good Sitting balance - Comments: required VC to sit in upright position, but able to right herself IND'ly once given the cue   Standing balance support: Bilateral upper extremity supported Standing balance-Leahy Scale: Fair Standing balance comment: able to stand, using RW, for < 1 minute, then reported "too tired" and returned to seated position                           ADL either performed or assessed with clinical judgement   ADL Overall ADL's : Needs assistance/impaired                     Lower Body Dressing: Min guard Lower Body Dressing Details (indicate cue type and reason): able to don/doff socks with increased time and close supervision for safety               General ADL Comments: Pt reluctant to participate in any ADL tasks, stating that she was too nauseous     Vision Patient Visual Report: No  change from baseline     Perception     Praxis      Cognition Arousal/Alertness: Awake/alert Behavior During Therapy: WFL for tasks assessed/performed;Anxious;Flat affect Overall Cognitive Status: Within Functional Limits for tasks assessed                                 General Comments: oriented to self, place, time        Exercises Other Exercises Other Exercises: Pt declining OOB mobility today secondary to c/o moderate nausea. However, pt agreeable for supine therex. Pt able to participate in  x15 BLE exercises including: ankle pumps, SAQ, glute sets, quad sets, heel slides, SLR, hip ABD/ADD, and isometric hip ADD. Increased assistance required for SLR and AP due to weakness. Other Exercises: Max assist required for supine scooting towards [facility] for repositioning at end of session. Pt able to assist with BLE in hooklying position. Verbal cues provided for sequencing and set up to ensure safety with mobility. Other Exercises: Bed mobility, t/f, educ re: EC   Shoulder Instructions       General Comments Pt declining OOB mobility today secondary to c/o nausea    Pertinent Vitals/ Pain       Pain Assessment: No/denies pain  Home Living                                          Prior Functioning/Environment              Frequency  Min 1X/week        Progress Toward Goals  OT Goals(current goals can now be found in the care plan section)  Progress towards OT goals: Progressing toward goals  Acute Rehab OT Goals Patient Stated Goal: to go home OT Goal Formulation: With patient Time For Goal Achievement: 06/18/20 Potential to Achieve Goals: Good  Plan Discharge plan needs to be updated    Co-evaluation                 AM-PAC OT "6 Clicks" Daily Activity     Outcome Measure   Help from another person eating meals?: None Help from another person taking care of personal grooming?: A Little Help from another person toileting, which includes using toliet, bedpan, or urinal?: A Lot Help from another person bathing (including washing, rinsing, drying)?: A Lot Help from another person to put on and taking off regular upper body clothing?: A Little Help from another person to put on and taking off regular lower body clothing?: A Lot 6 Click Score: 16    End of Session Equipment Utilized During Treatment: Rolling walker  OT Visit Diagnosis: Unsteadiness on feet (R26.81);Muscle weakness (generalized) (M62.81)   Activity Tolerance Patient  tolerated treatment well;Other (comment) (pt limited by nausea)   Patient Left in bed;with call bell/phone within reach;with bed alarm set;with family/visitor present;with nursing/sitter in room   Nurse Communication          Time: 2119-4174 OT Time Calculation (min): 20 min  Charges: OT General Charges $OT Visit: 1 Visit OT Treatments $Self Care/Home Management : 8-22 mins  Josiah Lobo, PhD, [place], OTR/L ascom [phone] 06/07/20, 11:47 AM

## 2020-06-07 NOTE — Progress Notes (Signed)
Pulmonary Medicine          Date: 06/07/2020,   MRN# [id number] Christine Morris 1941/02/18     AdmissionWeight: 44.9 kg                 CurrentWeight: 45.3 kg      CHIEF COMPLAINT:   Mediastinal mass with LLL infiltrate   HISTORY OF PRESENT ILLNESS   Pleasant 80 year old female who was previously seen for worsening dyspnea and advanced COPD with chronic hypoxemia. Patient was last seen in June 2020. She is currently using oxygen 24 hours a day at 3 to 4 L/min. Previously she was on Incruse Ellipta as well as [employer], we had initiated DuoNeb nebulizer therapy as well as Roflumilast 250 mcg daily. Patient states she had initiated this therapy and feels slightly improved.   She continues to make thick tenacious phlegm however reports no problems expectorating it. Today we had discussed bronchopulmonary hygiene techniques including incentive spirometer to combat atelectasis and patient will have one ordered.   She also has bronchiectasis which is COPD related and remains high risk for pneumonia and hospitalization as in January 2020. We had also discussed using low-dose Zithromax thrice weekly for chronic suppressive therapy and patient is agreeable to this plan. I had encouraged patient to participate in graduated exercise program/pulmonary rehab. She denies constitutional symptoms.  08/02/19- O2 concentrator stopped working and she was able to notify [employer], they were kind enough to replace it with a new device on Monday.   12/12/19- patient states she had episodes of epistaxis, ironically she reports using flonase and this somehow has helped her.  Patient had CT chest 10/27/19 we reviewed this together, there is findings suggestive of atypical granulomatous infection such as MAC as well as med/hilar lymphadenopathy suggestive of possible neoplasm, we will obtian PET as recommended.   She stopped using her Inentive spirometer but we have encouraged her to use it more and  she is doing that.   02/14/20- patient was hospitalized for respiratory distress and syncope, she was thought to have possible arrythmia and was monitored with telemetry but subsequently improved and d/cd. She had findings of low BP. She reports staying well hydrated drinking water several times daily and also drinking pepsi. She has good appetite and is eating as well as using ensure shakes to supplement for nourishment but despite all this she has lost 3 more lbs. She hat PET scan we discussed in detail regardign RLL nodule and hilar lymphadenopathy, patient states she feels she will not be able to undergo biopsy or therapy for lung ca and wishes to wait for now and think about it with husband. In the interim we discussed modalities to remove phlegm such as addition of mucomyst to nebulizer regimen. She is to continue TOBI for bronchiectasis  Patient is here today for biopsy of mediastinal mass and airway inspection of lLL infiltrate with post obstructive process.    06/03/20- patient improved clinically she is now on 30Fio2 -BIPAP 12/6.  I have ordered ddimer to rule out dvt/pe and ordered US dvt LE study for today.  If negative on both will not order CTPE protocol since patient just had CT done to reduce radiation load as she does have oncology therapy planned.  Will obtain VBG and remove BIPAP today if able.  Reviewed care plan with patient and husband.  Secure message sent to respiratory therapist.    06/04/20- patient resting in bed, husband at bedside. She is on  2L [place], rhonchi bilaterally on auscultation.  We are in D/C planning stages of hospitalization. Weaning steroids today from 60 bid IV solumedrol to 40 with plan to transition to PO pred.  Antibiotics to PO levoquin 750 po daily for 3 more days to finish on outpatient.  Lasix 20 today for residual mild effusion and congestion on auscultation.  PT and OT today to expedite dc plan. Dicussed care plan with primary attending physician Dr Billie Ruddy.     06/05/20- patient is optimized for dc home.  S/p PT/OT and oncology evaluation.  Will follow up on outpatient basis.  May dc home on prednisone 33m with taper by 538mper day and levoquin 750 for 3 more days please.     06/06/20- patient sleeping in bed. Husband at bedside, we discussed care plan. She has been with nausea today. PT/OT following and plan for dc to rehab due to significant deconditioning.   06/07/20- patient is with nausea, she received zofran and it did improve but not enough, ive added pheregan and scopolamine and dcd Daliresp and Singulair.  CBC is stable leukocytosis resolved.  Patient is waiting on rehab placement   PAST MEDICAL HISTORY   Past Medical History:  Diagnosis Date  . Afib ([facility]  . Anxiety   . Atrial fibrillation ([facility]CC[facility]2017  . Cervical stenosis of spine   . COPD (chronic obstructive pulmonary disease) ([facility]  . Depression   . Hyperlipidemia   . Hypothyroidism   . Nodule of right lung   . On supplemental oxygen by nasal cannula   . 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 Rupture of extensor tendon of finger   . Spondylosis of cervical region without myelopathy or radiculopathy   . Stroke (HCRed[place]  . Vertigo   . Wartenberg syndrome      SURGICAL HISTORY   Past Surgical History:  Procedure Laterality Date  . ABDOMINAL HYSTERECTOMY    . APPENDECTOMY    . BREAST BIOPSY Left 1971  . BREAST CYST EXCISION Left 1971  . CATARACT SURGERY Bilateral   . LOOP RECORDER IMPLANT    . OOPHORECTOMY Bilateral   . VIDEO BRONCHOSCOPY WITH ENDOBRONCHIAL NAVIGATION N/A 05/30/2020   Procedure: VIDEO BRONCHOSCOPY WITH ENDOBRONCHIAL NAVIGATION;  Surgeon: AlOttie GlazierMD;  Location: [facility] ORS;  Service: Thoracic;  Laterality: N/A;  . VIDEO BRONCHOSCOPY WITH ENDOBRONCHIAL ULTRASOUND N/A 05/30/2020   Procedure: VIDEO BRONCHOSCOPY WITH ENDOBRONCHIAL ULTRASOUND;  Surgeon: AlOttie GlazierMD;  Location: [facility] ORS;  Service: Thoracic;  Laterality: N/A;     FAMILY  HISTORY   History reviewed. No pertinent family history.   SOCIAL HISTORY   Social History   Tobacco Use  . Smoking status: Former SmResearch scientist (life sciences). Smokeless tobacco: Never Used  Vaping Use  . Vaping Use: Never used  Substance Use Topics  . Alcohol use: Never  . Drug use: Never     MEDICATIONS    Home Medication:    Current Medication:  Current Facility-Administered Medications:  .  0.9 %  sodium chloride infusion, 250 mL, Intravenous, PRN, WoSi RaiderNoAilene RudMD, Last Rate: 10 mL/hr at 06/04/20 0619, 250 mL at 06/04/20 0619 .  albuterol (PROVENTIL) (2.5 MG/3ML) 0.083% nebulizer solution 2.5 mg, 2.5 mg, Nebulization, Q2H PRN, Wouk, NoAilene RudMD .  apixaban (EBanner Desert Surgery Centertablet 5 mg, 5 mg, Oral, BID, WaRocky MorelRPH, 5 mg at 06/07/20 085974  escitalopram (LEXAPRO) tablet 20 mg, 20 mg, Oral, Daily, Wouk, NoAilene RudMD, 20  mg at 06/07/20 0851 .  fluticasone furoate-vilanterol (BREO ELLIPTA) 200-25 MCG/INH 1 puff, 1 puff, Inhalation, Daily, Wouk, Ailene Rud, MD, 1 puff at 06/06/20 8466 .  furosemide (LASIX) injection 20 mg, 20 mg, Intravenous, Daily, Ottie Glazier, MD, 20 mg at 06/07/20 0851 .  ibuprofen (ADVIL) tablet 400 mg, 400 mg, Oral, Q6H PRN, Enzo Bi, MD, 400 mg at 06/05/20 1533 .  influenza vaccine adjuvanted (FLUAD) injection 0.5 mL, 0.5 mL, Intramuscular, Tomorrow-1000, Dolan, Carissa E, RPH .  ipratropium-albuterol (DUONEB) 0.5-2.5 (3) MG/3ML nebulizer solution 3 mL, 3 mL, Nebulization, TID, Enzo Bi, MD, 3 mL at 06/07/20 1341 .  levothyroxine (SYNTHROID, LEVOTHROID) injection 38 mcg, 38 mcg, Intravenous, Daily, Lang Snow, NP, 38 mcg at 06/07/20 0849 .  LORazepam (ATIVAN) injection 1 mg, 1 mg, Intravenous, Q6H PRN, Wouk, Ailene Rud, MD, 1 mg at 06/07/20 0753 .  ondansetron (ZOFRAN) injection 4 mg, 4 mg, Intravenous, Q4H PRN, Mansy, Jan A, MD, 4 mg at 06/07/20 1304 .  phosphorus (K PHOS NEUTRAL) tablet 250 mg, 250 mg, Oral, TID, Shawna Clamp, MD,  250 mg at 06/07/20 0850 .  [COMPLETED] predniSONE (DELTASONE) tablet 50 mg, 50 mg, Oral, Q breakfast, 50 mg at 06/07/20 0851 **FOLLOWED BY** [START ON 06/08/2020] predniSONE (DELTASONE) tablet 40 mg, 40 mg, Oral, Q breakfast **FOLLOWED BY** [START ON 06/10/2020] predniSONE (DELTASONE) tablet 30 mg, 30 mg, Oral, Q breakfast **FOLLOWED BY** [START ON 06/12/2020] predniSONE (DELTASONE) tablet 20 mg, 20 mg, Oral, Q breakfast **FOLLOWED BY** [START ON 06/14/2020] predniSONE (DELTASONE) tablet 10 mg, 10 mg, Oral, Q breakfast, Shawna Clamp, MD .  promethazine (PHENERGAN) injection 6.25 mg, 6.25 mg, Intravenous, BID, Druanne Bosques, MD .  scopolamine (TRANSDERM-SCOP) 1 MG/3DAYS 1.5 mg, 1 patch, Transdermal, Q72H, Valentine Barney, MD .  simvastatin (ZOCOR) tablet 40 mg, 40 mg, Oral, QPM, Wouk, Ailene Rud, MD, 40 mg at 06/06/20 1638 .  sodium chloride flush (NS) 0.9 % injection 3 mL, 3 mL, Intravenous, Q12H, Wouk, Ailene Rud, MD, 3 mL at 06/07/20 0853 .  sodium chloride flush (NS) 0.9 % injection 3 mL, 3 mL, Intravenous, PRN, Wouk, Ailene Rud, MD .  traZODone (DESYREL) tablet 25 mg, 25 mg, Oral, QHS PRN, Mansy, Jan A, MD .  umeclidinium bromide (INCRUSE ELLIPTA) 62.5 MCG/INH 1 puff, 1 puff, Inhalation, Daily, Wouk, Ailene Rud, MD, 1 puff at 06/07/20 [phone]    ALLERGIES   Rivaroxaban     REVIEW OF SYSTEMS    Review of Systems:  Gen:  Denies  fever, sweats, chills weigh loss  HEENT: Denies blurred vision, double vision, ear pain, eye pain, hearing loss, nose bleeds, sore throat Cardiac:  No dizziness, chest pain or heaviness, chest tightness,edema Resp:   Denies cough or sputum porduction, shortness of breath,wheezing, hemoptysis,  Gi: Denies swallowing difficulty, stomach pain, nausea or vomiting, diarrhea, constipation, bowel incontinence Gu:  Denies bladder incontinence, burning urine Ext:   Denies Joint pain, stiffness or swelling Skin: Denies  skin rash, easy bruising or bleeding or  hives Endoc:  Denies polyuria, polydipsia , polyphagia or weight change Psych:   Denies depression, insomnia or hallucinations   Other:  All other systems negative   VS: BP 118/73 (BP Location: Right Arm)   Pulse 64   Temp 98.4 F (36.9 C)   Resp 17   Ht _0  (1.6 m)   Wt 45.3 kg   SpO2 98%   BMI 17.68 kg/m      PHYSICAL EXAM    GENERAL:NAD, no fevers, chills, no  weakness no fatigue HEAD: Normocephalic, atraumatic.  EYES: Pupils equal, round, reactive to light. Extraocular muscles intact. No scleral icterus.  MOUTH: Moist mucosal membrane. Dentition intact. No abscess noted.  EAR, NOSE, THROAT: Clear without exudates. No external lesions.  NECK: Supple. No thyromegaly. No nodules. No JVD.  PULMONARY: Decreased air entrly bilaterally CARDIOVASCULAR: S1 and S2. Regular rate and rhythm. No murmurs, rubs, or gallops. No edema. Pedal pulses 2+ bilaterally.  GASTROINTESTINAL: Soft, nontender, nondistended. No masses. Positive bowel sounds. No hepatosplenomegaly.  MUSCULOSKELETAL: No swelling, clubbing, or edema. Range of motion full in all extremities.  NEUROLOGIC: Cranial nerves II through XII are intact. No gross focal neurological deficits. Sensation intact. Reflexes intact.  SKIN: No ulceration, lesions, rashes, or cyanosis. Skin warm and dry. Turgor intact.  PSYCHIATRIC: Mood, affect within normal limits. The patient is awake, alert and oriented x 3. Insight, judgment intact.       IMAGING    DG Chest 1 View  Result Date: 06/02/2020 CLINICAL DATA:  Shortness of breath, right lobe biopsy performed Wednesday EXAM: CHEST  1 VIEW COMPARISON:  Radiograph 12/16/2019, CT 05/30/2020 FINDINGS: There is persistent atelectasis and/or consolidation in the left lung base with a left pleural effusion as was seen on comparison CT imaging 3 days prior. Some additional bronchitic and bronchiectatic changes are seen diffusely throughout the lungs with some chronically coarsened interstitial  changes and reticulonodular opacities throughout the lungs in a similar distribution to more remote comparison. No new consolidative opacity. No pneumothorax. No convincing CT features of edema at this time. The aorta is calcified. The remaining cardiomediastinal contours are unremarkable. No acute osseous or soft tissue abnormality. Degenerative changes are present in the imaged spine and shoulders. Telemetry leads and support devices overlie the chest. IMPRESSION: 1. Persistent atelectasis and/or consolidation in the left lung base with a left pleural effusion as was seen on comparison CT imaging. 2. Some chronic bronchitic/bronchiectatic changes with coarsened interstitium and diffuse reticulonodular opacities conspicuous for a long-standing/chronic atypical infection including mycobacterial etiologies. 3. No other acute cardiopulmonary abnormalities. 4. The aorta is calcified. The remaining cardiomediastinal contours are unremarkable. Electronically Signed   By: Lovena Le M.D.   On: 06/02/2020 05:39   CT CHEST WO CONTRAST  Result Date: 05/30/2020 CLINICAL DATA:  Shortness of breath.  Preop for bronchoscopy. EXAM: CT CHEST WITHOUT CONTRAST TECHNIQUE: Multidetector CT imaging of the chest was performed following the standard protocol without IV contrast. COMPARISON:  May 03, 2020. FINDINGS: Cardiovascular: Atherosclerosis of thoracic aorta is noted without aneurysm formation. Normal cardiac size. Small pericardial effusion is noted. Coronary artery calcifications are noted. Mediastinum/Nodes: As noted on prior exam, there is extensive mediastinal adenopathy present particularly in the right paratracheal pretracheal subcarinal and left hilar regions. This results in narrowing of the left mainstem bronchus. Thyroid gland is unremarkable. Esophagus is unremarkable. Lungs/Pleura: No pneumothorax is noted. Mild biapical scarring is noted. Mild to moderate left pleural effusion is noted with adjacent left  lower lobe postobstructive atelectasis or infiltrate. Upper Abdomen: No acute abnormality. Musculoskeletal: No chest wall mass or suspicious bone lesions identified. IMPRESSION: 1. Mild to moderate left pleural effusion is noted with adjacent left lower lobe postobstructive atelectasis or infiltrate. 2. Small pericardial effusion is noted. 3. Coronary artery calcifications are noted suggesting coronary artery disease. 4. As noted on prior exam, there is extensive mediastinal adenopathy present particularly in the right paratracheal, subcarinal and left hilar regions. This results in narrowing of the left mainstem bronchus. This is concerning for malignancy or  metastatic disease. Aortic Atherosclerosis (ICD10-I70.0). Electronically Signed   By: Marijo Conception M.D.   On: 05/30/2020 09:32   MR BRAIN W WO CONTRAST  Result Date: 06/05/2020 CLINICAL DATA:  Small-cell lung cancer, staging EXAM: MRI HEAD WITHOUT AND WITH CONTRAST TECHNIQUE: Multiplanar, multiecho pulse sequences of the brain and surrounding structures were obtained without and with intravenous contrast. CONTRAST:  26m GADAVIST GADOBUTROL 1 MMOL/ML IV SOLN COMPARISON:  May 2019 FINDINGS: Motion artifact is present. Findings below are within this limitation. Brain: There is no acute infarction or intracranial hemorrhage. There is no intracranial mass, mass effect, or edema. There is no hydrocephalus or extra-axial fluid collection. Prominence of the ventricles and sulci reflects generalized parenchymal volume loss. Small chronic infarcts of the right frontal lobe cortex and caudate. Additional patchy and confluent areas of T2 hyperintensity in the supratentorial and pontine white matter are nonspecific but probably reflect moderate chronic microvascular ischemic changes. Appearance is similar to the prior study. No abnormal enhancement. Vascular: Major vessel flow voids at the skull base are preserved. Skull and upper cervical spine: Normal marrow signal  is preserved. Sinuses/Orbits: Paranasal sinuses are aerated. Orbits are unremarkable. Other: Sella is unremarkable.  Mastoid air cells are clear. IMPRESSION: Suboptimal evaluation due to motion artifact. No evidence of intracranial metastatic disease. Small chronic infarcts and moderate chronic microvascular ischemic changes. Electronically Signed   By: PMacy MisM.D.   On: 06/05/2020 11:39   [place]Venous Img Lower Bilateral (DVT)  Result Date: 06/03/2020 CLINICAL DATA:  Shortness of breath EXAM: BILATERAL LOWER EXTREMITY VENOUS DOPPLER ULTRASOUND TECHNIQUE: Gray-scale sonography with compression, as well as color and duplex ultrasound, were performed to evaluate the deep venous system(s) from the level of the common femoral vein through the popliteal and proximal calf veins. COMPARISON:  None. FINDINGS: VENOUS Normal compressibility of the common femoral, superficial femoral, and popliteal veins, as well as the visualized calf veins. Visualized portions of profunda femoral vein and great saphenous vein unremarkable. No filling defects to suggest DVT on grayscale or color Doppler imaging. Doppler waveforms show normal direction of venous flow, normal respiratory plasticity and response to augmentation. Limited views of the contralateral common femoral vein are unremarkable. OTHER None. Limitations: none IMPRESSION: No acute deep vein thrombosis in the visualized bilateral lower extremities. Electronically Signed   By: SValentino SaxonMD   On: 06/03/2020 13:20   DG Chest Port 1 View  Result Date: 06/05/2020 CLINICAL DATA:  Shortness of breath EXAM: PORTABLE CHEST 1 VIEW COMPARISON:  06/02/2020 FINDINGS: Mild blunting of the left costophrenic angle is unchanged. Mild chronic bronchitic type changes. Calcific aortic atherosclerosis. There is left retrocardiac consolidation/atelectasis, unchanged. IMPRESSION: Unchanged left retrocardiac consolidation/atelectasis. Electronically Signed   By: KUlyses Jarred M.D.   On: 06/05/2020 02:30          ASSESSMENT/PLAN    Acute on chronic hypoxemic respiratory failure Due to Acute moderate exacerbation of COPD   - agree with levoquin and steroids - have stepped down to bid solumedrol 480m>>prednisone with taper  - ddimer to r/o PE - mild elevation due to active cancer  - dvt [place] r/o DVT- negativ e  -will consider PE study if above is abnormal   - has finished tx for psudomonas pna is back to baseline respiratory status.    Metastatic small cell lung cancer   - new diagnosis and may be cause of current exacerbation as mediastinal mass effect is singificant   - Have contacted oncologist - Dr  Janese Banks and patient has follow up set up on outpatient.    Anxiety disorder NOS   - complicating respiratory status    - patient reports episodes of breathlessness and feelings of suffocation and drowing due to advanced chronic lung disease.    - she appreciates and responds well do TID PRN ativan 78m.         Thank you for allowing me to participate in the care of this patient.    Patient/Family are satisfied with care plan and all questions have been answered.  This document was prepared using Dragon voice recognition software and may include unintentional dictation errors.     FOttie Glazier M.D.  Division of [facility]

## 2020-06-07 NOTE — Progress Notes (Addendum)
PROGRESS NOTE    Christine Morris  KZL:[id number] DOB: 1940-05-27 DOA: 06/02/2020 PCP: Chase Picket, MD   Brief Narrative:  This 80 years old female with PMH significant for severe COPD on home oxygen 4 L, paroxysmal A. fib on DOAC, hypothyroidism, recent diagnosis of small cell lung cancer who presents with worsening shortness of breath.  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  Assessment & Plan:   Active Problems:   CVA (cerebral vascular accident) ([place])   AF (paroxysmal atrial fibrillation) (HCC)   COPD with chronic bronchitis (HCC)   Chronic respiratory failure with hypoxia (HCC)   COPD (chronic obstructive pulmonary disease) with acute bronchitis (HCC)   Acute on chronic respiratory failure (HCC)   Lung cancer (HCC)   COPD with acute exacerbation (HCC)   Acute on chronic hypoxic hypercapnic respiratory failure sec to COPD with acute exacerbation Patient presented with increased work of breathing, she was tachypneic,  She was requiring 5 L of supplemental oxygen to maintain saturation above 94%. Flu/covid neg.  Pt was put on BiPAP. Continue methylprednisone 60 iv q6, DuoNeb. Pulm consulted.  Recommended to continue Levaquin and Solu-Medrol Weaned off BiPAP and to 4L today. Start taperring from IV solumedrol to prednisone 50 mg daily Continue with scheduled DuoNeb Continue home breo, daliresp, incurse, singulair Continue supplemental O2 to keep sats between 88-92%, wean as tolerated. She is weaned down to 3 L/min sats 94%.  Sepsis 2/2 pseudomonas PNA Patient was found to have tachycardia, tachypnea, leukocytosis.  No fever, neg procal.  Bronch lavage positive for pseudomonas She was started onzosynand levaquin on admission (discussed w/ Raul Del who advises double coverage for now) --zosyn d/c'ed. Continue Levaquin.  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 Janese Banks to arrange chemo/RT --oupatient palliative care consult  Hypokalemia >>> resolved Hypomagnesemia >>> resolved  History CVA - cont simvastatin  Parosyxmal a fib Here in sinus cont home Eliquis  Hypothyroidism - cont home synthroid  MDD - cont home lexapro  Anxiety --complicating respiratory status -- Continue IV ativan 52m q6h PRN   DVT prophylaxis: Eliquis Code Status: Full code Family Communication: No family at bedside Disposition Plan:  Status is: Inpatient  Remains inpatient appropriate because:Inpatient level of care appropriate due to severity of illness   Dispo: The patient is from: Home              Anticipated d/c is to:  SNF              Anticipated d/c date is: > 3 days              Patient currently is not medically stable to d/c.  Consultants:   Pulmonology  Procedures: Antimicrobials:  Anti-infectives (From admission, onward)   Start     Dose/Rate Route Frequency Ordered Stop   06/04/20 1300  levofloxacin (LEVAQUIN) tablet 750 mg        750 mg Oral Daily 06/04/20 1204 06/06/20 0924   06/03/20 0600  levofloxacin (LEVAQUIN) IVPB 750 mg  Status:  Discontinued        750 mg 100 mL/hr over 90 Minutes Intravenous Every 24 hours 06/02/20 1357 06/02/20 1529   06/03/20 0600  levofloxacin (LEVAQUIN) IVPB 750 mg  Status:  Discontinued        750 mg 100 mL/hr over 90 Minutes Intravenous Every 48 hours  06/02/20 1529 06/04/20 1203   06/02/20 0830  piperacillin-tazobactam (ZOSYN) IVPB 3.375 g  Status:  Discontinued        3.375 g 12.5 mL/hr over 240 Minutes Intravenous Every 8 hours 06/02/20 0823 06/04/20 1152   06/02/20 0515  cefTRIAXone (ROCEPHIN) 1 g in sodium chloride 0.9 % 100 mL IVPB        1 g 200 mL/hr over 30 Minutes Intravenous  Once 06/02/20 0510 06/02/20 0613   06/02/20 0515  azithromycin (ZITHROMAX) 500 mg in sodium chloride 0.9 % 250 mL IVPB        500  mg 250 mL/hr over 60 Minutes Intravenous  Once 06/02/20 0510 06/02/20 0800     Subjective: Patient was seen and examined at bedside.  Overnight events noted.  She feels better,  breathing has improved.  She is on 3 L saturation is 93%.  She denies any chest pain.  Objective: Vitals:   06/07/20 0313 06/07/20 0737 06/07/20 0741 06/07/20 1127  BP: 136/78 104/85  123/66  Pulse: 99 92  (!) 51  Resp: _0 Temp: 98.7 F (37.1 C) 98.5 F (36.9 C)  97.9 F (36.6 C)  TempSrc:      SpO2: 100% 98% 98% 99%  Weight: 45.3 kg     Height:        Intake/Output Summary (Last 24 hours) at 06/07/2020 1611 Last data filed at 06/07/2020 1441 Gross per 24 hour  Intake 240 ml  Output 1350 ml  Net -1110 ml   Filed Weights   06/05/20 0331 06/06/20 0500 06/07/20 0313  Weight: 43 kg 45.2 kg 45.3 kg    Examination:  General exam: Appears calm and comfortable, not in any acute distress. Respiratory system: Clear to auscultation. Respiratory effort normal. Cardiovascular system: S1 & S2 heard, RRR. No JVD, murmurs, rubs, gallops or clicks. No pedal edema. Gastrointestinal system: Abdomen is nondistended, soft and nontender. No organomegaly or masses felt. Normal bowel sounds heard. Central nervous system: Alert and oriented. No focal neurological deficits. Extremities: Symmetric 5 x 5 power.  No edema, no cyanosis, no clubbing. Skin: No rashes, lesions or ulcers Psychiatry: Judgement and insight appear normal. Mood & affect appropriate.     Data Reviewed: I have personally reviewed following labs and imaging studies  CBC: Recent Labs  Lab 06/02/20 0451 06/03/20 0559 06/04/20 0532 06/05/20 0449 06/06/20 0625 06/07/20 0605  WBC 16.0* 11.5* 13.5* 10.3 10.7* 9.7  NEUTROABS 13.6*  --   --   --   --   --   HGB 8.9* 8.5* 8.7* 8.5* 9.3* 9.6*  HCT 30.2* 27.4* 27.9* 27.2* 29.5* 30.3*  MCV 95.3 94.2 92.1 90.7 89.4 89.4  PLT 441* 358 357 391 394 017*   Basic Metabolic Panel: Recent Labs   Lab 06/03/20 0559 06/04/20 0532 06/05/20 0449 06/06/20 0625 06/07/20 0605  NA 143 142 138 141 138  K 4.5 4.5 4.2 3.9 3.3*  CL 110 107 99 102 99  CO2 _1 GLUCOSE 139* 103* 113* 107* 111*  BUN 22 25* 25* 32* 33*  CREATININE 0.65 0.68 0.72 0.89 0.73  CALCIUM 9.5 9.8 10.2 9.9 9.8  MG 2.1 2.1 1.9 2.0 2.0  PHOS  --   --   --   --  2.1*   GFR: Estimated Creatinine Clearance: 41.4 mL/min (by C-G formula based on SCr of 0.73 mg/dL). Liver Function Tests: Recent Labs  Lab 06/07/20 0605  AST 18  ALT 13  ALKPHOS 31*  BILITOT 0.4  PROT 6.5  ALBUMIN 3.0*   No results for input(s): LIPASE, AMYLASE in the last 168 hours. No results for input(s): AMMONIA in the last 168 hours. Coagulation Profile: Recent Labs  Lab 06/02/20 0451  INR 1.2   Cardiac Enzymes: No results for input(s): CKTOTAL, CKMB, CKMBINDEX, TROPONINI in the last 168 hours. BNP (last 3 results) No results for input(s): PROBNP in the last 8760 hours. HbA1C: No results for input(s): HGBA1C in the last 72 hours. CBG: No results for input(s): GLUCAP in the last 168 hours. Lipid Profile: No results for input(s): CHOL, HDL, LDLCALC, TRIG, CHOLHDL, LDLDIRECT in the last 72 hours. Thyroid Function Tests: No results for input(s): TSH, T4TOTAL, FREET4, T3FREE, THYROIDAB in the last 72 hours. Anemia Panel: No results for input(s): VITAMINB12, FOLATE, FERRITIN, TIBC, IRON, RETICCTPCT in the last 72 hours. Sepsis Labs: Recent Labs  Lab 06/02/20 0451  PROCALCITON <0.10  LATICACIDVEN 1.9    Recent Results (from the past 240 hour(s))  Culture, respiratory     Status: None (Preliminary result)   Collection Time: 05/30/20  2:53 PM   Specimen: Bronchial Wash; Respiratory  Result Value Ref Range Status   Specimen Description   Final    BRONCHIAL WASHINGS Performed at [facility], [address], [place], [place] [address]    Special Requests   Final    NONE Performed at [facility],  [address] [place], [place] [address]    Gram Stain   Final    MODERATE WBC PRESENT, PREDOMINANTLY PMN MODERATE GRAM NEGATIVE RODS    Culture   Final    MODERATE PSEUDOMONAS AERUGINOSA Sent to [place] for further susceptibility testing. Performed at [facility], [place] [address] [place], [place] [address]    Report Status PENDING  Incomplete  Acid Fast Smear (AFB)     Status: None   Collection Time: 05/30/20  2:53 PM   Specimen: Bronchoalveolar Lavage; Respiratory  Result Value Ref Range Status   AFB Specimen Processing Concentration  Final   Acid Fast Smear Negative  Final    Comment: (NOTE) Performed At: [facility] [place], [place] [id number] Rush Farmer MD [id number]    Source (AFB) BRONCHIAL ALVEOLAR LAVAGE  Final    Comment: Performed at [facility], [address] [place], [place] [address]  KOH prep     Status: None   Collection Time: 05/30/20  2:53 PM   Specimen: Bronchoalveolar Lavage  Result Value Ref Range Status   Specimen Description BRONCHIAL ALVEOLAR LAVAGE  Final   Special Requests NONE  Final   KOH Prep   Final    NO YEAST OR FUNGAL ELEMENTS SEEN Performed at [facility], [address], [place], [place] [address]    Report Status 05/30/2020 FINAL  Final  Aspergillus Ag, BAL/Serum     Status: None   Collection Time: 05/30/20  2:53 PM   Specimen: Bronchial Alveolar Lavage  Result Value Ref Range Status   Aspergillus Ag, BAL/Serum 0.05 0.00 - 0.49 Index Final    Comment: (NOTE) Performed At: [facility] [address] [place], [place] [id number] Rush Farmer MD IN:[id number]   Anaerobic culture     Status: None   Collection Time: 05/30/20  2:53 PM   Specimen: Bronchial Wash; Lung  Result Value Ref Range Status   Specimen Description   Final    BRONCHIAL WASHINGS Performed at [facility], [address] [address],  [place], [place] [address]    Special Requests   Final     NONE Performed at [facility], [address] [place], [place] [address]    Culture   Final    NO ANAEROBES ISOLATED Performed at [facility], [place] [address] [place], [place] [address]    Report Status 06/05/2020 FINAL  Final  Fungus Culture With Stain     Status: None (Preliminary result)   Collection Time: 05/30/20  2:53 PM  Result Value Ref Range Status   Fungus Stain Final report  Final    Comment: (NOTE) Performed At: [facility] [place], [place] [id number] Rush Farmer MD [id number]    Fungus (Mycology) Culture PENDING  Incomplete   Fungal Source BRONCHIAL ALVEOLAR LAVAGE  Final    Comment: Performed at [facility], [place] [address], [place], [place] [address]  Susceptibility, Aer + Anaerob     Status: Abnormal   Collection Time: 05/30/20  2:53 PM  Result Value Ref Range Status   Suscept, Aer + Anaerob Final report (A)  Final    Comment: (NOTE) Performed At: [facility] [place], [place] [id number] Rush Farmer MD CM:[id number]    Source of Sample [phone]  Final    Comment: SUSCEPTIBILITY FOR P.AERUGINOSA BRONCHIAL [facility] Performed at [facility], [place] [address], [place], [place] [address]   Fungus Culture Result     Status: None   Collection Time: 05/30/20  2:53 PM  Result Value Ref Range Status   Result 1 Comment  Final    Comment: (NOTE) KOH/Calcofluor preparation:  no fungus observed. Performed At: [facility] [place], [place] [id number] Rush Farmer MD [id number]   Susceptibility Result     Status: Abnormal   Collection Time: 05/30/20  2:53 PM  Result Value Ref Range Status   Suscept Result 1 Comment (A)  Final    Comment: (NOTE) Pseudomonas aeruginosa Identification performed by account, not confirmed by this laboratory.    Antimicrobial Suscept Comment  Final    Comment: (NOTE)      ** S = Susceptible; I = Intermediate; R = Resistant **                    P = Positive; N = Negative            MICS are expressed in micrograms per mL   Antibiotic                 RSLT#1    RSLT#2    RSLT#3    RSLT#4 Amikacin                       I Cefepime                       S Ceftazidime                    S Ciprofloxacin                  S Gentamicin                     I Imipenem                       S Levofloxacin  S Meropenem                      S Piperacillin                   S Ticarcillin                    S Tobramycin                     S Performed At: [facility] [employer] [address] [place], [place] [id number] Rush Farmer MD PZ:[id number]   Blood culture (routine x 2)     Status: None   Collection Time: 06/02/20  4:51 AM   Specimen: BLOOD  Result Value Ref Range Status   Specimen Description BLOOD RIGHT ANTECUBITAL  Final   Special Requests   Final    BOTTLES DRAWN AEROBIC AND ANAEROBIC Blood Culture adequate volume   Culture   Final    NO GROWTH 5 DAYS Performed at [facility], [address] [place], [place] [address]    Report Status 06/07/2020 FINAL  Final  Blood culture (routine x 2)     Status: None   Collection Time: 06/02/20  4:51 AM   Specimen: BLOOD  Result Value Ref Range Status   Specimen Description BLOOD BLOOD RIGHT FOREARM  Final   Special Requests   Final    BOTTLES DRAWN AEROBIC AND ANAEROBIC Blood Culture results may not be optimal due to an excessive volume of blood received in culture bottles   Culture   Final    NO GROWTH 5 DAYS Performed at [facility], [address] [place], [place] [address]    Report Status 06/07/2020 FINAL  Final  Resp Panel by RT-PCR (Flu A&B, Covid) Nasopharyngeal Swab     Status: None   Collection Time: 06/02/20  4:51 AM   Specimen: Nasopharyngeal Swab; Nasopharyngeal(NP) swabs in vial transport medium  Result Value Ref Range Status   SARS Coronavirus 2 by RT PCR NEGATIVE NEGATIVE Final    Comment:  (NOTE) SARS-CoV-2 target nucleic acids are NOT DETECTED.  The SARS-CoV-2 RNA is generally detectable in upper respiratory specimens during the acute phase of infection. The lowest concentration of SARS-CoV-2 viral copies this assay can detect is 138 copies/mL. A negative result does not preclude SARS-Cov-2 infection and should not be used as the sole basis for treatment or other patient management decisions. A negative result may occur with  improper specimen collection/handling, submission of specimen other than nasopharyngeal swab, presence of viral mutation(s) within the areas targeted by this assay, and inadequate number of viral copies(<138 copies/mL). A negative result must be combined with clinical observations, patient history, and epidemiological information. The expected result is Negative.  Fact Sheet for Patients:  EntrepreneurPulse.com.au  Fact Sheet for Healthcare Providers:  IncredibleEmployment.be  This test is no t yet approved or cleared by the [place] FDA and  has been authorized for detection and/or diagnosis of SARS-CoV-2 by FDA under an Emergency Use Authorization (EUA). This EUA will remain  in effect (meaning this test can be used) for the duration of the COVID-19 declaration under Section 564(b)(1) of the Act, 21 U.S.C.section 360bbb-3(b)(1), unless the authorization is terminated  or revoked sooner.       Influenza A by PCR NEGATIVE NEGATIVE Final   Influenza B by PCR NEGATIVE NEGATIVE Final    Comment: (NOTE) The Xpert Xpress SARS-CoV-2/FLU/RSV plus assay is intended as an  aid in the diagnosis of influenza from Nasopharyngeal swab specimens and should not be used as a sole basis for treatment. Nasal washings and aspirates are unacceptable for Xpert Xpress SARS-CoV-2/FLU/RSV testing.  Fact Sheet for Patients: EntrepreneurPulse.com.au  Fact Sheet for Healthcare  Providers: IncredibleEmployment.be  This test is not yet approved or cleared by the [place] FDA and has been authorized for detection and/or diagnosis of SARS-CoV-2 by FDA under an Emergency Use Authorization (EUA). This EUA will remain in effect (meaning this test can be used) for the duration of the COVID-19 declaration under Section 564(b)(1) of the Act, 21 U.S.C. section 360bbb-3(b)(1), unless the authorization is terminated or revoked.  Performed at [facility], [address], [place], [place] [address]          Radiology Studies: No results found.  Scheduled Meds: . apixaban  5 mg Oral BID  . escitalopram  20 mg Oral Daily  . fluticasone furoate-vilanterol  1 puff Inhalation Daily  . furosemide  20 mg Intravenous Daily  . influenza vaccine adjuvanted  0.5 mL Intramuscular Tomorrow-1000  . ipratropium-albuterol  3 mL Nebulization TID  . levothyroxine  38 mcg Intravenous Daily  . montelukast  10 mg Oral BH-q7a  . phosphorus  250 mg Oral TID  . [START ON 06/08/2020] predniSONE  40 mg Oral Q breakfast   Followed by  . [START ON 06/10/2020] predniSONE  30 mg Oral Q breakfast   Followed by  . [START ON 06/12/2020] predniSONE  20 mg Oral Q breakfast   Followed by  . [START ON 06/14/2020] predniSONE  10 mg Oral Q breakfast  . roflumilast  250 mcg Oral Daily  . simvastatin  40 mg Oral QPM  . sodium chloride flush  3 mL Intravenous Q12H  . umeclidinium bromide  1 puff Inhalation Daily   Continuous Infusions: . sodium chloride 250 mL (06/04/20 0619)     LOS: 5 days    Time spent: 25 mins    Dimetri Armitage, MD [facility]   If 7PM-7AM, please contact night-coverage

## 2020-06-08 DIAGNOSIS — J9621 Acute and chronic respiratory failure with hypoxia: Secondary | ICD-10-CM | POA: Diagnosis not present

## 2020-06-08 DIAGNOSIS — J9622 Acute and chronic respiratory failure with hypercapnia: Secondary | ICD-10-CM | POA: Diagnosis not present

## 2020-06-08 LAB — CBC
HCT: 29.8 % — ABNORMAL LOW (ref 36.0–46.0)
Hemoglobin: 9.6 g/dL — ABNORMAL LOW (ref 12.0–15.0)
MCH: 28.7 pg (ref 26.0–34.0)
MCHC: 32.2 g/dL (ref 30.0–36.0)
MCV: 89.2 fL (ref 80.0–100.0)
Platelets: 405 10*3/uL — ABNORMAL HIGH (ref 150–400)
RBC: 3.34 MIL/uL — ABNORMAL LOW (ref 3.87–5.11)
RDW: 14.4 % (ref 11.5–15.5)
WBC: 9.6 10*3/uL (ref 4.0–10.5)
nRBC: 0 % (ref 0.0–0.2)

## 2020-06-08 LAB — BASIC METABOLIC PANEL
Anion gap: 7 (ref 5–15)
BUN: 35 mg/dL — ABNORMAL HIGH (ref 8–23)
CO2: 32 mmol/L (ref 22–32)
Calcium: 9.6 mg/dL (ref 8.9–10.3)
Chloride: 99 mmol/L (ref 98–111)
Creatinine, Ser: 0.92 mg/dL (ref 0.44–1.00)
GFR, Estimated: >60 mL/min (ref >60)
Glucose, Bld: 139 mg/dL — ABNORMAL HIGH (ref 70–99)
Potassium: 3.6 mmol/L (ref 3.5–5.1)
Sodium: 138 mmol/L (ref 135–145)

## 2020-06-08 LAB — PHOSPHORUS: Phosphorus: 1.9 mg/dL — ABNORMAL LOW (ref 2.5–4.6)

## 2020-06-08 LAB — MAGNESIUM: Magnesium: 1.9 mg/dL (ref 1.7–2.4)

## 2020-06-08 MED ORDER — LEVOTHYROXINE SODIUM 50 MCG PO TABS
75.0000 ug | ORAL_TABLET | Freq: Every day | ORAL | Status: DC
  Administered 2020-06-09 – 2020-06-12 (×4): 75 ug via ORAL
  Filled 2020-06-08 (×4): qty 1

## 2020-06-08 NOTE — Progress Notes (Signed)
Patient transported by RN to Rm 150. Report given to RN Butch Penny. Patient belongings with patient.

## 2020-06-08 NOTE — TOC Progression Note (Signed)
Transition of Care [facility]) - Progression Note    Patient Details  Name: Christine Morris MRN: [id number] Date of Birth: Jan 20, 1941  Transition of Care [facility]) CM/SW [place], RN Phone Number: 06/08/2020, 12:09 PM  Clinical Narrative:  Spoke with patient who is more alert and oriented compared to yesterday. Patient states she wants to go home, not in SNF, agrees to home health services. Spoke with patients daughter [name], who understands Home Health will follow up and service patient at home. [facility] provides oxygen and patient states husband will bring portable tank when discharged. [facility] to provide [facility] PT/OT/RN services.          Expected Discharge Plan and Services                                                 Social Determinants of Health (SDOH) Interventions    Readmission Risk Interventions No flowsheet data found.

## 2020-06-08 NOTE — Care Management Important Message (Signed)
Important Message  Patient Details  Name: Christine Morris MRN: [id number] Date of Birth: May 23, 1941   Medicare Important Message Given:  Yes     Dannette Barbara 06/08/2020, 1:03 PM

## 2020-06-08 NOTE — Progress Notes (Signed)
Pulmonary Medicine          Date: 06/08/2020,   MRN# [id number] Christine Morris Nov 27, 1940     AdmissionWeight: 44.9 kg                 CurrentWeight: 45.3 kg      CHIEF COMPLAINT:   Mediastinal mass with LLL infiltrate   HISTORY OF PRESENT ILLNESS   Pleasant 80 year old female who was previously seen for worsening dyspnea and advanced COPD with chronic hypoxemia. Patient was last seen in June 2020. She is currently using oxygen 24 hours a day at 3 to 4 L/min. Previously she was on Incruse Ellipta as well as [employer], we had initiated DuoNeb nebulizer therapy as well as Roflumilast 250 mcg daily. Patient states she had initiated this therapy and feels slightly improved.   She continues to make thick tenacious phlegm however reports no problems expectorating it. Today we had discussed bronchopulmonary hygiene techniques including incentive spirometer to combat atelectasis and patient will have one ordered.   She also has bronchiectasis which is COPD related and remains high risk for pneumonia and hospitalization as in January 2020. We had also discussed using low-dose Zithromax thrice weekly for chronic suppressive therapy and patient is agreeable to this plan. I had encouraged patient to participate in graduated exercise program/pulmonary rehab. She denies constitutional symptoms.  08/02/19- O2 concentrator stopped working and she was able to notify [employer], they were kind enough to replace it with a new device on Monday.   12/12/19- patient states she had episodes of epistaxis, ironically she reports using flonase and this somehow has helped her.  Patient had CT chest 10/27/19 we reviewed this together, there is findings suggestive of atypical granulomatous infection such as MAC as well as med/hilar lymphadenopathy suggestive of possible neoplasm, we will obtian PET as recommended.   She stopped using her Inentive spirometer but we have encouraged her to use it more and  she is doing that.   02/14/20- patient was hospitalized for respiratory distress and syncope, she was thought to have possible arrythmia and was monitored with telemetry but subsequently improved and d/cd. She had findings of low BP. She reports staying well hydrated drinking water several times daily and also drinking pepsi. She has good appetite and is eating as well as using ensure shakes to supplement for nourishment but despite all this she has lost 3 more lbs. She hat PET scan we discussed in detail regardign RLL nodule and hilar lymphadenopathy, patient states she feels she will not be able to undergo biopsy or therapy for lung ca and wishes to wait for now and think about it with husband. In the interim we discussed modalities to remove phlegm such as addition of mucomyst to nebulizer regimen. She is to continue TOBI for bronchiectasis  Patient is here today for biopsy of mediastinal mass and airway inspection of lLL infiltrate with post obstructive process.    06/03/20- patient improved clinically she is now on 30Fio2 -BIPAP 12/6.  I have ordered ddimer to rule out dvt/pe and ordered US dvt LE study for today.  If negative on both will not order CTPE protocol since patient just had CT done to reduce radiation load as she does have oncology therapy planned.  Will obtain VBG and remove BIPAP today if able.  Reviewed care plan with patient and husband.  Secure message sent to respiratory therapist.    06/04/20- patient resting in bed, husband at bedside. She is on  2L [place], rhonchi bilaterally on auscultation.  We are in D/C planning stages of hospitalization. Weaning steroids today from 60 bid IV solumedrol to 40 with plan to transition to PO pred.  Antibiotics to PO levoquin 750 po daily for 3 more days to finish on outpatient.  Lasix 20 today for residual mild effusion and congestion on auscultation.  PT and OT today to expedite dc plan. Dicussed care plan with primary attending physician Dr Billie Ruddy.     06/05/20- patient is optimized for dc home.  S/p PT/OT and oncology evaluation.  Will follow up on outpatient basis.  May dc home on prednisone 61m with taper by 559mper day and levoquin 750 for 3 more days please.     06/06/20- patient sleeping in bed. Husband at bedside, we discussed care plan. She has been with nausea today. PT/OT following and plan for dc to rehab due to significant deconditioning.   06/07/20- patient is with nausea, she received zofran and it did improve but not enough, ive added pheregan and scopolamine and dcd Daliresp and Singulair.  CBC is stable leukocytosis resolved.  Patient is waiting on rehab placement  06/08/20- patient is improved.  Nausea is better.  She is on 3L/min [place].  D/C planning now   PAST MEDICAL HISTORY   Past Medical History:  Diagnosis Date  . [place]ib ([facility]  . Anxiety   . Atrial fibrillation ([facility]  . Cervical stenosis of spine   . COPD (chronic obstructive pulmonary disease) (HCTobias  . Depression   . Hyperlipidemia   . Hypothyroidism   . Nodule of right lung   . On supplemental oxygen by nasal cannula   . 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 Rupture of extensor tendon of finger   . Spondylosis of cervical region without myelopathy or radiculopathy   . Stroke ([facility]  . Vertigo   . Wartenberg syndrome      SURGICAL HISTORY   Past Surgical History:  Procedure Laterality Date  . ABDOMINAL HYSTERECTOMY    . APPENDECTOMY    . BREAST BIOPSY Left 1971  . BREAST CYST EXCISION Left 1971  . CATARACT SURGERY Bilateral   . LOOP RECORDER IMPLANT    . OOPHORECTOMY Bilateral   . VIDEO BRONCHOSCOPY WITH ENDOBRONCHIAL NAVIGATION N/A 05/30/2020   Procedure: VIDEO BRONCHOSCOPY WITH ENDOBRONCHIAL NAVIGATION;  Surgeon: AlOttie GlazierMD;  Location: [facility] ORS;  Service: Thoracic;  Laterality: N/A;  . VIDEO BRONCHOSCOPY WITH ENDOBRONCHIAL ULTRASOUND N/A 05/30/2020   Procedure: VIDEO BRONCHOSCOPY WITH ENDOBRONCHIAL ULTRASOUND;  Surgeon:  AlOttie GlazierMD;  Location: [facility] ORS;  Service: Thoracic;  Laterality: N/A;     FAMILY HISTORY   History reviewed. No pertinent family history.   SOCIAL HISTORY   Social History   Tobacco Use  . Smoking status: Former SmResearch scientist (life sciences). Smokeless tobacco: Never Used  Vaping Use  . Vaping Use: Never used  Substance Use Topics  . Alcohol use: Never  . Drug use: Never     MEDICATIONS    Home Medication:    Current Medication:  Current Facility-Administered Medications:  .  0.9 %  sodium chloride infusion, 250 mL, Intravenous, PRN, WoSi RaiderNoAilene RudMD, Last Rate: 10 mL/hr at 06/04/20 0619, 250 mL at 06/04/20 0619 .  albuterol (PROVENTIL) (2.5 MG/3ML) 0.083% nebulizer solution 2.5 mg, 2.5 mg, Nebulization, Q2H PRN, Wouk, NoAilene RudMD .  apixaban (ELIQUIS) tablet 5 mg, 5 mg, Oral, BID, WaRocky MorelRPH, 5 mg  at 06/08/20 0923 .  escitalopram (LEXAPRO) tablet 20 mg, 20 mg, Oral, Daily, Wouk, Ailene Rud, MD, 20 mg at 06/08/20 [phone]  fluticasone furoate-vilanterol (BREO ELLIPTA) 200-25 MCG/INH 1 puff, 1 puff, Inhalation, Daily, Wouk, Ailene Rud, MD, 1 puff at 06/08/20 0931 .  furosemide (LASIX) injection 20 mg, 20 mg, Intravenous, Daily, Ottie Glazier, MD, 20 mg at 06/08/20 0924 .  ibuprofen (ADVIL) tablet 400 mg, 400 mg, Oral, Q6H PRN, Enzo Bi, MD, 400 mg at 06/05/20 1533 .  influenza vaccine adjuvanted (FLUAD) injection 0.5 mL, 0.5 mL, Intramuscular, Tomorrow-1000, Dolan, Carissa E, RPH .  ipratropium-albuterol (DUONEB) 0.5-2.5 (3) MG/3ML nebulizer solution 3 mL, 3 mL, Nebulization, TID, Enzo Bi, MD, 3 mL at 06/08/20 1354 .  [START ON 06/09/2020] levothyroxine (SYNTHROID) tablet 75 mcg, 75 mcg, Oral, Q0600, Shawna Clamp, MD .  LORazepam (ATIVAN) injection 1 mg, 1 mg, Intravenous, Q6H PRN, Si Raider, Ailene Rud, MD, 1 mg at 06/07/20 0753 .  ondansetron (ZOFRAN) injection 4 mg, 4 mg, Intravenous, Q4H PRN, Mansy, Jan A, MD, 4 mg at 06/08/20 1615 .  phosphorus (K PHOS  NEUTRAL) tablet 250 mg, 250 mg, Oral, TID, Shawna Clamp, MD, 250 mg at 06/07/20 0850 .  [COMPLETED] predniSONE (DELTASONE) tablet 50 mg, 50 mg, Oral, Q breakfast, 50 mg at 06/07/20 0851 **FOLLOWED BY** predniSONE (DELTASONE) tablet 40 mg, 40 mg, Oral, Q breakfast, 40 mg at 06/08/20 0923 **FOLLOWED BY** [START ON 06/10/2020] predniSONE (DELTASONE) tablet 30 mg, 30 mg, Oral, Q breakfast **FOLLOWED BY** [START ON 06/12/2020] predniSONE (DELTASONE) tablet 20 mg, 20 mg, Oral, Q breakfast **FOLLOWED BY** [START ON 06/14/2020] predniSONE (DELTASONE) tablet 10 mg, 10 mg, Oral, Q breakfast, Shawna Clamp, MD .  promethazine (PHENERGAN) injection 6.25 mg, 6.25 mg, Intravenous, BID, Lanney Gins, Raya Mckinstry, MD, 6.25 mg at 06/08/20 0934 .  scopolamine (TRANSDERM-SCOP) 1 MG/3DAYS 1.5 mg, 1 patch, Transdermal, Q72H, Lanney Gins, Nolyn Eilert, MD, 1.5 mg at 06/07/20 1834 .  simvastatin (ZOCOR) tablet 40 mg, 40 mg, Oral, QPM, Wouk, Ailene Rud, MD, 40 mg at 06/08/20 1815 .  sodium chloride flush (NS) 0.9 % injection 3 mL, 3 mL, Intravenous, Q12H, Wouk, Ailene Rud, MD, 3 mL at 06/08/20 0945 .  sodium chloride flush (NS) 0.9 % injection 3 mL, 3 mL, Intravenous, PRN, Wouk, Ailene Rud, MD .  traZODone (DESYREL) tablet 25 mg, 25 mg, Oral, QHS PRN, Mansy, Jan A, MD .  umeclidinium bromide (INCRUSE ELLIPTA) 62.5 MCG/INH 1 puff, 1 puff, Inhalation, Daily, Wouk, Ailene Rud, MD, 1 puff at 06/08/20 0945    ALLERGIES   Rivaroxaban     REVIEW OF SYSTEMS    Review of Systems:  Gen:  Denies  fever, sweats, chills weigh loss  HEENT: Denies blurred vision, double vision, ear pain, eye pain, hearing loss, nose bleeds, sore throat Cardiac:  No dizziness, chest pain or heaviness, chest tightness,edema Resp:   Denies cough or sputum porduction, shortness of breath,wheezing, hemoptysis,  Gi: Denies swallowing difficulty, stomach pain, nausea or vomiting, diarrhea, constipation, bowel incontinence Gu:  Denies bladder incontinence,  burning urine Ext:   Denies Joint pain, stiffness or swelling Skin: Denies  skin rash, easy bruising or bleeding or hives Endoc:  Denies polyuria, polydipsia , polyphagia or weight change Psych:   Denies depression, insomnia or hallucinations   Other:  All other systems negative   VS: BP 115/73 (BP Location: Right Arm)   Pulse 91   Temp 97.7 F (36.5 C)   Resp 16   Ht _0  (1.6 m)  Wt 45.3 kg   SpO2 98%   BMI 17.68 kg/m      PHYSICAL EXAM    GENERAL:NAD, no fevers, chills, no weakness no fatigue HEAD: Normocephalic, atraumatic.  EYES: Pupils equal, round, reactive to light. Extraocular muscles intact. No scleral icterus.  MOUTH: Moist mucosal membrane. Dentition intact. No abscess noted.  EAR, NOSE, THROAT: Clear without exudates. No external lesions.  NECK: Supple. No thyromegaly. No nodules. No JVD.  PULMONARY: Decreased air entrly bilaterally CARDIOVASCULAR: S1 and S2. Regular rate and rhythm. No murmurs, rubs, or gallops. No edema. Pedal pulses 2+ bilaterally.  GASTROINTESTINAL: Soft, nontender, nondistended. No masses. Positive bowel sounds. No hepatosplenomegaly.  MUSCULOSKELETAL: No swelling, clubbing, or edema. Range of motion full in all extremities.  NEUROLOGIC: Cranial nerves II through XII are intact. No gross focal neurological deficits. Sensation intact. Reflexes intact.  SKIN: No ulceration, lesions, rashes, or cyanosis. Skin warm and dry. Turgor intact.  PSYCHIATRIC: Mood, affect within normal limits. The patient is awake, alert and oriented x 3. Insight, judgment intact.       IMAGING    DG Chest 1 View  Result Date: 06/02/2020 CLINICAL DATA:  Shortness of breath, right lobe biopsy performed Wednesday EXAM: CHEST  1 VIEW COMPARISON:  Radiograph 12/16/2019, CT 05/30/2020 FINDINGS: There is persistent atelectasis and/or consolidation in the left lung base with a left pleural effusion as was seen on comparison CT imaging 3 days prior. Some additional  bronchitic and bronchiectatic changes are seen diffusely throughout the lungs with some chronically coarsened interstitial changes and reticulonodular opacities throughout the lungs in a similar distribution to more remote comparison. No new consolidative opacity. No pneumothorax. No convincing CT features of edema at this time. The aorta is calcified. The remaining cardiomediastinal contours are unremarkable. No acute osseous or soft tissue abnormality. Degenerative changes are present in the imaged spine and shoulders. Telemetry leads and support devices overlie the chest. IMPRESSION: 1. Persistent atelectasis and/or consolidation in the left lung base with a left pleural effusion as was seen on comparison CT imaging. 2. Some chronic bronchitic/bronchiectatic changes with coarsened interstitium and diffuse reticulonodular opacities conspicuous for a long-standing/chronic atypical infection including mycobacterial etiologies. 3. No other acute cardiopulmonary abnormalities. 4. The aorta is calcified. The remaining cardiomediastinal contours are unremarkable. Electronically Signed   By: Lovena Le M.D.   On: 06/02/2020 05:39   CT CHEST WO CONTRAST  Result Date: 05/30/2020 CLINICAL DATA:  Shortness of breath.  Preop for bronchoscopy. EXAM: CT CHEST WITHOUT CONTRAST TECHNIQUE: Multidetector CT imaging of the chest was performed following the standard protocol without IV contrast. COMPARISON:  May 03, 2020. FINDINGS: Cardiovascular: Atherosclerosis of thoracic aorta is noted without aneurysm formation. Normal cardiac size. Small pericardial effusion is noted. Coronary artery calcifications are noted. Mediastinum/Nodes: As noted on prior exam, there is extensive mediastinal adenopathy present particularly in the right paratracheal pretracheal subcarinal and left hilar regions. This results in narrowing of the left mainstem bronchus. Thyroid gland is unremarkable. Esophagus is unremarkable. Lungs/Pleura: No  pneumothorax is noted. Mild biapical scarring is noted. Mild to moderate left pleural effusion is noted with adjacent left lower lobe postobstructive atelectasis or infiltrate. Upper Abdomen: No acute abnormality. Musculoskeletal: No chest wall mass or suspicious bone lesions identified. IMPRESSION: 1. Mild to moderate left pleural effusion is noted with adjacent left lower lobe postobstructive atelectasis or infiltrate. 2. Small pericardial effusion is noted. 3. Coronary artery calcifications are noted suggesting coronary artery disease. 4. As noted on prior exam, there is extensive mediastinal  adenopathy present particularly in the right paratracheal, subcarinal and left hilar regions. This results in narrowing of the left mainstem bronchus. This is concerning for malignancy or metastatic disease. Aortic Atherosclerosis (ICD10-I70.0). Electronically Signed   By: Marijo Conception M.D.   On: 05/30/2020 09:32   MR BRAIN W WO CONTRAST  Result Date: 06/05/2020 CLINICAL DATA:  Small-cell lung cancer, staging EXAM: MRI HEAD WITHOUT AND WITH CONTRAST TECHNIQUE: Multiplanar, multiecho pulse sequences of the brain and surrounding structures were obtained without and with intravenous contrast. CONTRAST:  68m GADAVIST GADOBUTROL 1 MMOL/ML IV SOLN COMPARISON:  May 2019 FINDINGS: Motion artifact is present. Findings below are within this limitation. Brain: There is no acute infarction or intracranial hemorrhage. There is no intracranial mass, mass effect, or edema. There is no hydrocephalus or extra-axial fluid collection. Prominence of the ventricles and sulci reflects generalized parenchymal volume loss. Small chronic infarcts of the right frontal lobe cortex and caudate. Additional patchy and confluent areas of T2 hyperintensity in the supratentorial and pontine white matter are nonspecific but probably reflect moderate chronic microvascular ischemic changes. Appearance is similar to the prior study. No abnormal  enhancement. Vascular: Major vessel flow voids at the skull base are preserved. Skull and upper cervical spine: Normal marrow signal is preserved. Sinuses/Orbits: Paranasal sinuses are aerated. Orbits are unremarkable. Other: Sella is unremarkable.  Mastoid air cells are clear. IMPRESSION: Suboptimal evaluation due to motion artifact. No evidence of intracranial metastatic disease. Small chronic infarcts and moderate chronic microvascular ischemic changes. Electronically Signed   By: PMacy MisM.D.   On: 06/05/2020 11:39   UK[place]Venous Img Lower Bilateral (DVT)  Result Date: 06/03/2020 CLINICAL DATA:  Shortness of breath EXAM: BILATERAL LOWER EXTREMITY VENOUS DOPPLER ULTRASOUND TECHNIQUE: Gray-scale sonography with compression, as well as color and duplex ultrasound, were performed to evaluate the deep venous system(s) from the level of the common femoral vein through the popliteal and proximal calf veins. COMPARISON:  None. FINDINGS: VENOUS Normal compressibility of the common femoral, superficial femoral, and popliteal veins, as well as the visualized calf veins. Visualized portions of profunda femoral vein and great saphenous vein unremarkable. No filling defects to suggest DVT on grayscale or color Doppler imaging. Doppler waveforms show normal direction of venous flow, normal respiratory plasticity and response to augmentation. Limited views of the contralateral common femoral vein are unremarkable. OTHER None. Limitations: none IMPRESSION: No acute deep vein thrombosis in the visualized bilateral lower extremities. Electronically Signed   By: SValentino SaxonMD   On: 06/03/2020 13:20   DG Chest Port 1 View  Result Date: 06/05/2020 CLINICAL DATA:  Shortness of breath EXAM: PORTABLE CHEST 1 VIEW COMPARISON:  06/02/2020 FINDINGS: Mild blunting of the left costophrenic angle is unchanged. Mild chronic bronchitic type changes. Calcific aortic atherosclerosis. There is left retrocardiac  consolidation/atelectasis, unchanged. IMPRESSION: Unchanged left retrocardiac consolidation/atelectasis. Electronically Signed   By: KUlyses JarredM.D.   On: 06/05/2020 02:30          ASSESSMENT/PLAN    Acute on chronic hypoxemic respiratory failure Due to Acute moderate exacerbation of COPD   - agree with levoquin and steroids - have stepped down to bid solumedrol 452m>>prednisone with taper  - ddimer to r/o PE - mild elevation due to active cancer  - dvt US[place]ore[place] r/o DVT- negativ e  -will consider PE study if above is abnormal   - has finished tx for psudomonas pna is back to baseline respiratory status.    Metastatic small cell lung  cancer   - new diagnosis and may be cause of current exacerbation as mediastinal mass effect is singificant   - Have contacted oncologist - Dr Janese Banks and patient has follow up set up on outpatient.    Anxiety disorder NOS   - complicating respiratory status    - patient reports episodes of breathlessness and feelings of suffocation and drowing due to advanced chronic lung disease.    - she appreciates and responds well do TID PRN ativan 19m.         Thank you for allowing me to participate in the care of this patient.    Patient/Family are satisfied with care plan and all questions have been answered.  This document was prepared using Dragon voice recognition software and may include unintentional dictation errors.     FOttie Glazier M.D.  Division of [facility]

## 2020-06-08 NOTE — Progress Notes (Signed)
Physical Therapy Treatment Patient Details Name: Christine Morris MRN: [id number] DOB: [birth year] Today's Date: 06/08/2020    History of Present Illness Pt is a 80 y/o F with PMH: chronic hypoxic RF, advanced COPD (on 4-5L at baseline), bronchiectasis, Afib on Eliquis, and  known b/l small pleural effusions (s/p recent lung biopsy on Weds 1/5) who presents via EMS from home for assessment of acute onset of shortness of breath associate with increase in cough; admitted for management of acute/chronic respiratory failured due to COPD exacerbation.  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    PT Comments    Patient received in bed, reports continued nausea, no vomiting. Declining OOB initially, then husband arrived and assist with encouraging patient to get oob. Patient requires mod assist to raise trunk to sitting position. Min/mod assist for sit to stand and pivot to recliner. Patient generally looking unwell. Reports dizziness with sitting HR in 80s, O2 sats in 90s on 3 liter of O2. Patient will continue to benefit from skilled PT while here to improve strength, functional independence and safety with mobility.        Follow Up Recommendations  SNF;Supervision for mobility/OOB     Equipment Recommendations  3in1 (PT)    Recommendations for Other Services       Precautions / Restrictions Precautions Precautions: Fall Restrictions Weight Bearing Restrictions: No    Mobility  Bed Mobility Overal bed mobility: Needs Assistance Bed Mobility: Supine to Sit     Supine to sit: Mod assist     General bed mobility comments: mod assist to raise trunk to sitting position  Transfers Overall transfer level: Needs assistance Equipment used: 1 person hand held assist Transfers: Sit to/from Stand;Stand Pivot Transfers Sit to Stand: Mod assist Stand pivot transfers: Mod assist       General transfer comment: patient mobility limited  by not feeling well, nausea.  Ambulation/Gait                 Stairs             Wheelchair Mobility    Modified Rankin (Stroke Patients Only)       Balance Overall balance assessment: Needs assistance Sitting-balance support: Feet supported Sitting balance-Leahy Scale: Good Sitting balance - Comments: ble to sit unsupported   Standing balance support: Bilateral upper extremity supported;During functional activity Standing balance-Leahy Scale: Fair Standing balance comment: Requires B UE support to stand and pivot to recliner                            Cognition Arousal/Alertness: Awake/alert Behavior During Therapy: WFL for tasks assessed/performed Overall Cognitive Status: Within Functional Limits for tasks assessed                                        Exercises Total Joint Exercises Ankle Circles/Pumps: AROM;Both;10 reps Hip ABduction/ADduction: AAROM;Both;10 reps Straight Leg Raises: AROM;10 reps;Both    General Comments        Pertinent Vitals/Pain Pain Assessment: No/denies pain    Home Living                      Prior Function            PT Goals (current goals can now be found in the care plan section) Acute Rehab PT Goals  PT Goal Formulation: With patient Time For Goal Achievement: 06/18/20 Potential to Achieve Goals: Fair Progress towards PT goals: Not progressing toward goals - comment (limited by nausea, light headed)    Frequency    Min 2X/week      PT Plan Current plan remains appropriate    Co-evaluation              AM-PAC PT "6 Clicks" Mobility   Outcome Measure  Help needed turning from your back to your side while in a flat bed without using bedrails?: A Little Help needed moving from lying on your back to sitting on the side of a flat bed without using bedrails?: A Lot Help needed moving to and from a bed to a chair (including a wheelchair)?: A Lot Help needed  standing up from a chair using your arms (e.g., wheelchair or bedside chair)?: A Lot Help needed to walk in hospital room?: A Lot Help needed climbing 3-5 steps with a railing? : A Lot 6 Click Score: 13    End of Session Equipment Utilized During Treatment: Oxygen Activity Tolerance: Patient tolerated treatment well;Other (comment) Patient left: in chair;with chair alarm set;with call bell/phone within reach;with family/visitor present Nurse Communication: Mobility status PT Visit Diagnosis: Muscle weakness (generalized) (M62.81);Difficulty in walking, not elsewhere classified (R26.2);Unsteadiness on feet (R26.81);Other abnormalities of gait and mobility (R26.89)     Time: 1015-1030 PT Time Calculation (min) (ACUTE ONLY): 15 min  Charges:  $Therapeutic Activity: 8-22 mins                     Roan Sawchuk, PT, GCS 06/08/20,10:46 AM

## 2020-06-08 NOTE — Progress Notes (Signed)
PROGRESS NOTE    Christine Morris  KGM:[id number] DOB: 11-Nov-1940 DOA: 06/02/2020 PCP: Chase Picket, MD   Brief Narrative:  This 80 years old female with PMH significant for severe COPD on home oxygen 4 L, paroxysmal A. fib on DOAC, hypothyroidism, recent diagnosis of small cell lung cancer who presents with worsening shortness of breath.  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  Patient is admitted for acute on chronic hypoxic and hypercapnic respiratory failure secondary to COPD exacerbation.  She has improved and she is at her baseline oxygen requirement.  She has completed antibiotic treatment for possible pneumonia.  Assessment & Plan:   Active Problems:   CVA (cerebral vascular accident) ([place])   AF (paroxysmal atrial fibrillation) (HCC)   COPD with chronic bronchitis (HCC)   Chronic respiratory failure with hypoxia (HCC)   COPD (chronic obstructive pulmonary disease) with acute bronchitis (HCC)   Acute on chronic respiratory failure (HCC)   Lung cancer (HCC)   COPD with acute exacerbation (HCC)   Acute on chronic hypoxic hypercapnic respiratory failure sec to COPD with acute exacerbation Patient presented with increased work of breathing, she was tachypneic,  She was requiring 5 L of supplemental oxygen to maintain saturation above 94%. Flu/covid neg.  Pt was put on BiPAP. Continue methylprednisone 60 iv q6, DuoNeb. Pulm consulted.  Recommended to continue Levaquin and Solu-Medrol Weaned off BiPAP and to 4L today. Start taperring from IV solumedrol to prednisone 50 mg daily Continue with scheduled DuoNeb Continue home breo, daliresp, incurse, singulair Continue supplemental O2 to keep sats between 88-92%, wean as tolerated. She is weaned down to 3 L/min sats 94%.  Sepsis 2/2 pseudomonas PNA Patient was found to have tachycardia,  tachypnea, leukocytosis.  No fever, neg procal.  Bronch lavage positive for pseudomonas She was started onzosynand levaquin on admission (discussed w/ Raul Del who advises double coverage for now) --zosyn d/c'ed. 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 Janese Banks to arrange chemo/RT --oupatient palliative care consult  Hypokalemia >>> resolved Hypomagnesemia >>> resolved  History CVA - cont simvastatin  Parosyxmal a fib Here in sinus cont home Eliquis  Hypothyroidism - cont home synthroid  MDD - cont home lexapro  Anxiety --complicating respiratory status -- Continue IV ativan 53m q6h PRN   DVT prophylaxis: Eliquis Code Status: Full code Family Communication: No family at bedside Disposition Plan:  Status is: Inpatient  Remains inpatient appropriate because:Inpatient level of care appropriate due to severity of illness   Dispo: The patient is from: Home              Anticipated d/c is to:  SNF/ Home with home health services              Anticipated d/c date is: > 3 days              Patient currently is not medically stable to d/c.  Consultants:   Pulmonology  Procedures: Antimicrobials:  Anti-infectives (From admission, onward)   Start     Dose/Rate Route Frequency Ordered Stop   06/04/20 1300  levofloxacin (LEVAQUIN) tablet 750 mg        750 mg Oral Daily 06/04/20 1204 06/06/20 0924   06/03/20 0600  levofloxacin (LEVAQUIN) IVPB 750 mg  Status:  Discontinued  750 mg 100 mL/hr over 90 Minutes Intravenous Every 24 hours 06/02/20 1357 06/02/20 1529   06/03/20 0600  levofloxacin (LEVAQUIN) IVPB 750 mg  Status:  Discontinued        750 mg 100 mL/hr over 90 Minutes Intravenous Every 48 hours 06/02/20 1529 06/04/20 1203   06/02/20 0830  piperacillin-tazobactam (ZOSYN) IVPB 3.375 g  Status:  Discontinued        3.375 g 12.5 mL/hr over 240 Minutes  Intravenous Every 8 hours 06/02/20 0823 06/04/20 1152   06/02/20 0515  cefTRIAXone (ROCEPHIN) 1 g in sodium chloride 0.9 % 100 mL IVPB        1 g 200 mL/hr over 30 Minutes Intravenous  Once 06/02/20 0510 06/02/20 0613   06/02/20 0515  azithromycin (ZITHROMAX) 500 mg in sodium chloride 0.9 % 250 mL IVPB        500 mg 250 mL/hr over 60 Minutes Intravenous  Once 06/02/20 0510 06/02/20 0800     Subjective: Patient was seen and examined at bedside.  Overnight events noted.  She reports feeling better,  breathing has improved.  she is on 3 L O2 saturation is 94%.  Objective: Vitals:   06/08/20 0747 06/08/20 1032 06/08/20 1146 06/08/20 1355  BP:   116/88   Pulse:   91   Resp:   19   Temp:   98.2 F (36.8 C)   TempSrc:   Oral   SpO2: 98% 93% 100% 97%  Weight:      Height:        Intake/Output Summary (Last 24 hours) at 06/08/2020 1528 Last data filed at 06/08/2020 1023 Gross per 24 hour  Intake 240 ml  Output 975 ml  Net -735 ml   Filed Weights   06/05/20 0331 06/06/20 0500 06/07/20 0313  Weight: 43 kg 45.2 kg 45.3 kg    Examination:  General exam: Appears calm and comfortable, not in any acute distress. Respiratory system: Clear to auscultation. Respiratory effort normal. Cardiovascular system: S1 & S2 heard, RRR. No JVD, murmurs, rubs, gallops or clicks. No pedal edema. Gastrointestinal system: Abdomen is nondistended, soft and nontender. No organomegaly or masses felt. Normal bowel sounds heard. Central nervous system: Alert and oriented. No focal neurological deficits. Extremities: Symmetric 5 x 5 power.  No edema, no cyanosis, no clubbing. Skin: No rashes, lesions or ulcers Psychiatry: Judgement and insight appear normal. Mood & affect appropriate.     Data Reviewed: I have personally reviewed following labs and imaging studies  CBC: Recent Labs  Lab 06/02/20 0451 06/03/20 0559 06/04/20 0532 06/05/20 0449 06/06/20 0625 06/07/20 0605 06/08/20 0441  WBC 16.0*    < > 13.5* 10.3 10.7* 9.7 9.6  NEUTROABS 13.6*  --   --   --   --   --   --   HGB 8.9*   < > 8.7* 8.5* 9.3* 9.6* 9.6*  HCT 30.2*   < > 27.9* 27.2* 29.5* 30.3* 29.8*  MCV 95.3   < > 92.1 90.7 89.4 89.4 89.2  PLT 441*   < > 357 391 394 405* 405*   < > = values in this interval not displayed.   Basic Metabolic Panel: Recent Labs  Lab 06/04/20 0532 06/05/20 0449 06/06/20 0625 06/07/20 0605 06/08/20 0441  NA 142 138 141 138 138  K 4.5 4.2 3.9 3.3* 3.6  CL 107 99 102 99 99  CO2 _0 32  GLUCOSE 103* 113* 107* 111* 139*  BUN 25* 25* 32*  33* 35*  CREATININE 0.68 0.72 0.89 0.73 0.92  CALCIUM 9.8 10.2 9.9 9.8 9.6  MG 2.1 1.9 2.0 2.0 1.9  PHOS  --   --   --  2.1* 1.9*   GFR: Estimated Creatinine Clearance: 36 mL/min (by C-G formula based on SCr of 0.92 mg/dL). Liver Function Tests: Recent Labs  Lab 06/07/20 0605  AST 18  ALT 13  ALKPHOS 31*  BILITOT 0.4  PROT 6.5  ALBUMIN 3.0*   No results for input(s): LIPASE, AMYLASE in the last 168 hours. No results for input(s): AMMONIA in the last 168 hours. Coagulation Profile: Recent Labs  Lab 06/02/20 0451  INR 1.2   Cardiac Enzymes: No results for input(s): CKTOTAL, CKMB, CKMBINDEX, TROPONINI in the last 168 hours. BNP (last 3 results) No results for input(s): PROBNP in the last 8760 hours. HbA1C: No results for input(s): HGBA1C in the last 72 hours. CBG: No results for input(s): GLUCAP in the last 168 hours. Lipid Profile: No results for input(s): CHOL, HDL, LDLCALC, TRIG, CHOLHDL, LDLDIRECT in the last 72 hours. Thyroid Function Tests: No results for input(s): TSH, T4TOTAL, FREET4, T3FREE, THYROIDAB in the last 72 hours. Anemia Panel: No results for input(s): VITAMINB12, FOLATE, FERRITIN, TIBC, IRON, RETICCTPCT in the last 72 hours. Sepsis Labs: Recent Labs  Lab 06/02/20 0451  PROCALCITON <0.10  LATICACIDVEN 1.9    Recent Results (from the past 240 hour(s))  Culture, respiratory     Status: None  (Preliminary result)   Collection Time: 05/30/20  2:53 PM   Specimen: Bronchial Wash; Respiratory  Result Value Ref Range Status   Specimen Description   Final    BRONCHIAL WASHINGS Performed at [facility], [address] [place], [place] [address]    Special Requests   Final    NONE Performed at [facility], [address] [place], [place] [address]    Gram Stain   Final    MODERATE WBC PRESENT, PREDOMINANTLY PMN MODERATE GRAM NEGATIVE RODS    Culture   Final    MODERATE PSEUDOMONAS AERUGINOSA Sent to [facility] for further susceptibility testing. Performed at [facility], [place] [address] [place], [place] [address]    Report Status PENDING  Incomplete  Acid Fast Smear (AFB)     Status: None   Collection Time: 05/30/20  2:53 PM   Specimen: Bronchoalveolar Lavage; Respiratory  Result Value Ref Range Status   AFB Specimen Processing Concentration  Final   Acid Fast Smear Negative  Final    Comment: (NOTE) Performed At: [facility] [place], [place] [id number] Rush Farmer MD [id number]    Source (AFB) BRONCHIAL ALVEOLAR LAVAGE  Final    Comment: Performed at [facility], [address] [place], [place] [address]  KOH prep     Status: None   Collection Time: 05/30/20  2:53 PM   Specimen: Bronchoalveolar Lavage  Result Value Ref Range Status   Specimen Description BRONCHIAL ALVEOLAR LAVAGE  Final   Special Requests NONE  Final   KOH Prep   Final    NO YEAST OR FUNGAL ELEMENTS SEEN Performed at [facility], [address] [place], [place] [address]    Report Status 05/30/2020 FINAL  Final  Aspergillus Ag, BAL/Serum     Status: None   Collection Time: 05/30/20  2:53 PM   Specimen: Bronchial Alveolar Lavage  Result Value Ref Range Status   Aspergillus Ag, BAL/Serum 0.05 0.00 - 0.49 Index Final  Comment: (NOTE) Performed At: [facility] [place], [place]  [id number] Rush Farmer MD [id number]   Anaerobic culture     Status: None   Collection Time: 05/30/20  2:53 PM   Specimen: Bronchial Wash; Lung  Result Value Ref Range Status   Specimen Description   Final    BRONCHIAL WASHINGS Performed at [facility], [address], [place], [place] [address]    Special Requests   Final    NONE Performed at [facility], [address], [place], [place] [address]    Culture   Final    NO ANAEROBES ISOLATED Performed at [facility], [place] [address] [place], [place] [address]    Report Status 06/05/2020 FINAL  Final  Fungus Culture With Stain     Status: None (Preliminary result)   Collection Time: 05/30/20  2:53 PM  Result Value Ref Range Status   Fungus Stain Final report  Final    Comment: (NOTE) Performed At: [facility] [place], [place] [id number] Rush Farmer MD [id number]    Fungus (Mycology) Culture PENDING  Incomplete   Fungal Source BRONCHIAL ALVEOLAR LAVAGE  Final    Comment: Performed at [facility], [place] [address] [place], [place] [address]  Susceptibility, Aer + Anaerob     Status: Abnormal   Collection Time: 05/30/20  2:53 PM  Result Value Ref Range Status   Suscept, Aer + Anaerob Final report (A)  Final    Comment: (NOTE) Performed At: [facility] [place], [place] [id number] Rush Farmer MD [id number]    Source of Sample [phone]  Final    Comment: SUSCEPTIBILITY FOR P.AERUGINOSA BRONCHIAL [facility] Performed at [facility], [place] [address] [place], [place] [address]   Fungus Culture Result     Status: None   Collection Time: 05/30/20  2:53 PM  Result Value Ref Range Status   Result 1 Comment  Final    Comment: (NOTE) KOH/Calcofluor preparation:  no fungus observed. Performed At: [facility] [place], [place] [id number] Rush Farmer MD [id number]   Susceptibility Result     Status:  Abnormal   Collection Time: 05/30/20  2:53 PM  Result Value Ref Range Status   Suscept Result 1 Comment (A)  Final    Comment: (NOTE) Pseudomonas aeruginosa Identification performed by account, not confirmed by this laboratory.    Antimicrobial Suscept Comment  Final    Comment: (NOTE)      ** S = Susceptible; I = Intermediate; R = Resistant **                   P = Positive; N = Negative            MICS are expressed in micrograms per mL   Antibiotic                 RSLT#1    RSLT#2    RSLT#3    RSLT#4 Amikacin                       I Cefepime                       S Ceftazidime                    S Ciprofloxacin  S Gentamicin                     I Imipenem                       S Levofloxacin                   S Meropenem                      S Piperacillin                   S Ticarcillin                    S Tobramycin                     S Performed At: [facility] [employer] [address] [place], [place] [id number] Rush Farmer MD [id number]   Blood culture (routine x 2)     Status: None   Collection Time: 06/02/20  4:51 AM   Specimen: BLOOD  Result Value Ref Range Status   Specimen Description BLOOD RIGHT ANTECUBITAL  Final   Special Requests   Final    BOTTLES DRAWN AEROBIC AND ANAEROBIC Blood Culture adequate volume   Culture   Final    NO GROWTH 5 DAYS Performed at [facility], [address] [place], [place] [address]    Report Status 06/07/2020 FINAL  Final  Blood culture (routine x 2)     Status: None   Collection Time: 06/02/20  4:51 AM   Specimen: BLOOD  Result Value Ref Range Status   Specimen Description BLOOD BLOOD RIGHT FOREARM  Final   Special Requests   Final    BOTTLES DRAWN AEROBIC AND ANAEROBIC Blood Culture results may not be optimal due to an excessive volume of blood received in culture bottles   Culture   Final    NO GROWTH 5 DAYS Performed at [facility], [address] [place], [place]  [address]    Report Status 06/07/2020 FINAL  Final  Resp Panel by RT-PCR (Flu A&B, Covid) Nasopharyngeal Swab     Status: None   Collection Time: 06/02/20  4:51 AM   Specimen: Nasopharyngeal Swab; Nasopharyngeal(NP) swabs in vial transport medium  Result Value Ref Range Status   SARS Coronavirus 2 by RT PCR NEGATIVE NEGATIVE Final    Comment: (NOTE) SARS-CoV-2 target nucleic acids are NOT DETECTED.  The SARS-CoV-2 RNA is generally detectable in upper respiratory specimens during the acute phase of infection. The lowest concentration of SARS-CoV-2 viral copies this assay can detect is 138 copies/mL. A negative result does not preclude SARS-Cov-2 infection and should not be used as the sole basis for treatment or other patient management decisions. A negative result may occur with  improper specimen collection/handling, submission of specimen other than nasopharyngeal swab, presence of viral mutation(s) within the areas targeted by this assay, and inadequate number of viral copies(<138 copies/mL). A negative result must be combined with clinical observations, patient history, and epidemiological information. The expected result is Negative.  Fact Sheet for Patients:  EntrepreneurPulse.com.au  Fact Sheet for Healthcare Providers:  IncredibleEmployment.be  This test is no t yet approved or cleared by the [place] FDA and  has been authorized for detection and/or diagnosis of SARS-CoV-2 by FDA under an Emergency Use Authorization (EUA). This EUA will remain  in effect (meaning this  test can be used) for the duration of the COVID-19 declaration under Section 564(b)(1) of the Act, 21 U.S.C.section 360bbb-3(b)(1), unless the authorization is terminated  or revoked sooner.       Influenza A by PCR NEGATIVE NEGATIVE Final   Influenza B by PCR NEGATIVE NEGATIVE Final    Comment: (NOTE) The Xpert Xpress SARS-CoV-2/FLU/RSV plus assay is intended as an  aid in the diagnosis of influenza from Nasopharyngeal swab specimens and should not be used as a sole basis for treatment. Nasal washings and aspirates are unacceptable for Xpert Xpress SARS-CoV-2/FLU/RSV testing.  Fact Sheet for Patients: EntrepreneurPulse.com.au  Fact Sheet for Healthcare Providers: IncredibleEmployment.be  This test is not yet approved or cleared by the [place] FDA and has been authorized for detection and/or diagnosis of SARS-CoV-2 by FDA under an Emergency Use Authorization (EUA). This EUA will remain in effect (meaning this test can be used) for the duration of the COVID-19 declaration under Section 564(b)(1) of the Act, 21 U.S.C. section 360bbb-3(b)(1), unless the authorization is terminated or revoked.  Performed at [facility], [address] [place], [place] [address]          Radiology Studies: No results found.  Scheduled Meds: . apixaban  5 mg Oral BID  . escitalopram  20 mg Oral Daily  . fluticasone furoate-vilanterol  1 puff Inhalation Daily  . furosemide  20 mg Intravenous Daily  . influenza vaccine adjuvanted  0.5 mL Intramuscular Tomorrow-1000  . ipratropium-albuterol  3 mL Nebulization TID  . [START ON 06/09/2020] levothyroxine  75 mcg Oral Q0600  . phosphorus  250 mg Oral TID  . predniSONE  40 mg Oral Q breakfast   Followed by  . [START ON 06/10/2020] predniSONE  30 mg Oral Q breakfast   Followed by  . [START ON 06/12/2020] predniSONE  20 mg Oral Q breakfast   Followed by  . [START ON 06/14/2020] predniSONE  10 mg Oral Q breakfast  . promethazine  6.25 mg Intravenous BID  . scopolamine  1 patch Transdermal Q72H  . simvastatin  40 mg Oral QPM  . sodium chloride flush  3 mL Intravenous Q12H  . umeclidinium bromide  1 puff Inhalation Daily   Continuous Infusions: . sodium chloride 250 mL (06/04/20 0619)     LOS: 6 days    Time spent: 25 mins    Tai Skelly, MD [facility]   If 7PM-7AM, please contact night-coverage

## 2020-06-08 NOTE — Progress Notes (Addendum)
Mobility Specialist - Progress Note   06/08/20 1223  Mobility  Activity  (seated exercises)  Level of Assistance Standby assist, set-up cues, supervision of patient - no hands on  Assistive Device None  Mobility Response Tolerated well  Mobility performed by Mobility specialist  $Mobility charge 1 Mobility    Pt seated on recliner upon arrival. Pt agreeable to seated exercises. Pt performed: ankle pumps x 10, kicks x 10, and marches x 10. No c/o pain or SOB. Pt did c/o nausea. In conclusion, pt tolerated session well. Pt remained seated on recliner at the end of session w/ husband present in room. Nurse was notified.    Themmy Ksor Mobility Specialist  06/08/20, 12:25 PM

## 2020-06-09 DIAGNOSIS — J9621 Acute and chronic respiratory failure with hypoxia: Secondary | ICD-10-CM | POA: Diagnosis not present

## 2020-06-09 DIAGNOSIS — J9622 Acute and chronic respiratory failure with hypercapnia: Secondary | ICD-10-CM | POA: Diagnosis not present

## 2020-06-09 DIAGNOSIS — L8990 Pressure ulcer of unspecified site, unspecified stage: Secondary | ICD-10-CM | POA: Insufficient documentation

## 2020-06-09 LAB — BASIC METABOLIC PANEL
Anion gap: 9 (ref 5–15)
BUN: 35 mg/dL — ABNORMAL HIGH (ref 8–23)
CO2: 29 mmol/L (ref 22–32)
Calcium: 9.6 mg/dL (ref 8.9–10.3)
Chloride: 100 mmol/L (ref 98–111)
Creatinine, Ser: 0.92 mg/dL (ref 0.44–1.00)
GFR, Estimated: >60 mL/min (ref >60)
Glucose, Bld: 123 mg/dL — ABNORMAL HIGH (ref 70–99)
Potassium: 3.5 mmol/L (ref 3.5–5.1)
Sodium: 138 mmol/L (ref 135–145)

## 2020-06-09 LAB — CBC
HCT: 30.5 % — ABNORMAL LOW (ref 36.0–46.0)
Hemoglobin: 10.3 g/dL — ABNORMAL LOW (ref 12.0–15.0)
MCH: 30 pg (ref 26.0–34.0)
MCHC: 33.8 g/dL (ref 30.0–36.0)
MCV: 88.9 fL (ref 80.0–100.0)
Platelets: 444 10*3/uL — ABNORMAL HIGH (ref 150–400)
RBC: 3.43 MIL/uL — ABNORMAL LOW (ref 3.87–5.11)
RDW: 14.6 % (ref 11.5–15.5)
WBC: 10.8 10*3/uL — ABNORMAL HIGH (ref 4.0–10.5)
nRBC: 0 % (ref 0.0–0.2)

## 2020-06-09 LAB — PHOSPHORUS: Phosphorus: 2 mg/dL — ABNORMAL LOW (ref 2.5–4.6)

## 2020-06-09 LAB — MAGNESIUM: Magnesium: 1.9 mg/dL (ref 1.7–2.4)

## 2020-06-09 MED ORDER — K PHOS MONO-SOD PHOS DI & MONO 155-852-130 MG PO TABS
250.0000 mg | ORAL_TABLET | Freq: Three times a day (TID) | ORAL | Status: AC
  Administered 2020-06-09 – 2020-06-10 (×6): 250 mg via ORAL
  Filled 2020-06-09 (×6): qty 1

## 2020-06-09 MED ORDER — SCOPOLAMINE 1 MG/3DAYS TD PT72: 1.0000 | MEDICATED_PATCH | TRANSDERMAL | 12 refills | Status: AC

## 2020-06-09 MED ORDER — APIXABAN 5 MG PO TABS: 5.0000 mg | ORAL_TABLET | Freq: Two times a day (BID) | ORAL | 1 refills | Status: AC

## 2020-06-09 MED ORDER — PREDNISONE 10 MG PO TABS: 30.0000 mg | ORAL_TABLET | Freq: Every day | ORAL | 0 refills | Status: DC

## 2020-06-09 NOTE — Discharge Summary (Addendum)
Physician Discharge Summary  Christine Morris DDU:[id number] DOB: 08/17/1940 DOA: 06/02/2020  PCP: Chase Picket, MD  Admit date: 06/02/2020   Discharge date: 06/12/2020.  Admitted From: Home.  Disposition:   Home with Home services.  Recommendations for Outpatient Follow-up:  1. Follow up with PCP in 1-2 weeks. 2. Please obtain BMP/CBC in one week. 3. Advised to continue prednisone taper as directed. 4. Advised to follow-up pulmonology as scheduled.  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 fib on DOAC, hypothyroidism, recent diagnosis of small cell lung cancer who presents with worsening shortness of breath.  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  Patient is admitted for acute on chronic hypoxic and hypercapnic respiratory failure secondary to COPD exacerbation.  She has improved and she is at her baseline oxygen requirement.  She has completed antibiotic treatment for possible pneumonia.  She was followed by pulmonology in the hospital was started on Solu-Medrol and antibiotics which she completed the course.  He seems much improved and at her baseline.  PT recommended a skilled nursing facility but patient wants to be discharged home with home services.  Patient was discharged but on the day of discharge she became severely short of breath with dizziness.  Discharge was held and then snowstorm happened which further delayed her discharge.  Patient seems much better and is being discharged today on 06/12/2020. Patient is being discharged and she will follow-up with pulmonology in [place] clinic.  She was managed  for below problems.  Discharge Diagnoses:  Active Problems:   CVA (cerebral vascular accident) ([place])   AF (paroxysmal atrial fibrillation) (HCC)   COPD with chronic bronchitis (HCC)   Chronic respiratory failure with hypoxia (HCC)   COPD (chronic obstructive pulmonary disease) with acute bronchitis (HCC)   Acute on chronic respiratory failure (HCC)   Lung cancer (HCC)   COPD with acute exacerbation (HCC)  Acute on chronic hypoxic and hypercapnic respiratory failure sec to COPD with acute exacerbation Patient presented with increased work of breathing, she was tachypneic,  She was requiring 5 L of supplemental oxygen to maintain saturation above 94%. Flu/covid neg. Pt was put on BiPAP. Continue methylprednisone 60 iv q6, DuoNeb. Pulm consulted.  Recommended to continue Levaquin and Solu-Medrol Weaned off BiPAP and to 4L  [place]ia [place] Start taperring from IV solumedrol to prednisone 50 mg daily Continue with scheduled DuoNeb Continue home breo, daliresp, incurse, singulair Continue supplemental O2 to keep sats between 88-92%, wean as tolerated. She is weaned down to 3 L/min sats 94%. Cleared from pulmonology to be discharged.  Sepsis 2/2 pseudomonas PNA : Patient was found to have tachycardia, tachypnea, leukocytosis. No fever, neg procal. Bronch lavage positive for pseudomonas She was started on zosynand levaquin on admission (discussed w/ Raul Del who advises double coverage for now) --zosyn d/c'ed. She has completed antibiotic treatment for possible pneumonia. -She has completed Levaquin treatment for 3 more days as per pulmonology.  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 Janese Banks to arrange chemo/RT --oupatient palliative care consult  Hypokalemia >>> resolved.  Hypomagnesemia >>> resolved.   History CVA - cont simvastatin  Parosyxmal a fib Here  in sinus cont home Eliquis  Hypothyroidism - cont home synthroid  MDD -  cont home lexapro  Anxiety --complicating respiratory status -- Continue IV ativan 72m q6h PRN  Discharge Instructions  Discharge Instructions    Call MD for:  difficulty breathing, headache or visual disturbances   Complete by: As directed    Call MD for:  persistant dizziness or light-headedness   Complete by: As directed    Call MD for:  persistant nausea and vomiting   Complete by: As directed    Diet - low sodium heart healthy   Complete by: As directed    Diet Carb Modified   Complete by: As directed    Discharge instructions   Complete by: As directed    Advised to follow-up with primary care physician in 1 week.   Advised to continue prednisone taper as directed Advised to follow-up pulmonology as scheduled.   Increase activity slowly   Complete by: As directed      Allergies as of 06/09/2020      Reactions   Rivaroxaban Hives      Medication List    STOP taking these medications   azithromycin 250 MG tablet Commonly known as: ZITHROMAX   tobramycin (PF) 300 MG/5ML nebulizer solution Commonly known as: TOBI     TAKE these medications   acetaminophen 500 MG tablet Commonly known as: TYLENOL Take 1,000 mg by mouth every 6 (six) hours as needed for pain.   apixaban 5 MG Tabs tablet Commonly known as: ELIQUIS Take 1 tablet (5 mg total) by mouth 2 (two) times daily. What changed:   medication strength  how much to take  when to take this   Breo Ellipta 200-25 MCG/INH Aepb Generic drug: fluticasone furoate-vilanterol Inhale 1 puff into the lungs daily.   calcitRIOL 0.5 MCG capsule Commonly known as: ROCALTROL Take 0.5 mcg by mouth daily.   Daliresp 250 MCG Tabs Generic drug: Roflumilast Take 250 mcg by mouth daily.   escitalopram 20 MG tablet Commonly known as: LEXAPRO Take 20 mg by mouth daily.   fenofibrate 160 MG tablet Take 160 mg by mouth daily.   ferrous gluconate 324 MG tablet Commonly known as: FERGON Take 324 mg by mouth daily  with breakfast.   Incruse Ellipta 62.5 MCG/INH Aepb Generic drug: umeclidinium bromide Inhale 1 puff into the lungs daily.   levothyroxine 75 MCG tablet Commonly known as: SYNTHROID Take 75 mcg by mouth daily before breakfast.   megestrol 625 MG/5ML suspension Commonly known as: MEGACE ES Take 5 mLs by mouth daily.   montelukast 10 MG tablet Commonly known as: SINGULAIR Take 10 mg by mouth every morning.   potassium chloride 10 MEQ tablet Commonly known as: KLOR-CON Take 10 mEq by mouth daily.   predniSONE 10 MG tablet Commonly known as: DELTASONE Take 3 tablets (30 mg total) by mouth daily with breakfast. Advised to take prednisone 30 mg daily for 3 days then 20 mg daily for 3 days then 10 mg daily for 3 days and stop Start taking on: June 10, 2020 What changed:   medication strength  how much to take  when to take this  additional instructions   scopolamine 1 MG/3DAYS Commonly known as: TRANSDERM-SCOP Place 1 patch (1.5 mg total) onto the skin every 3 (three) days. Start taking on: June 10, 2020   vitamin B-12 1000 MCG tablet Commonly known as: CYANOCOBALAMIN Take 1,000 mcg by mouth daily.       Follow-up Information  Chase Picket, MD Follow up in 1 week(s).   Specialty: Family Medicine Contact information: [place] [address] [phone]        Ottie Glazier, MD Follow up in 1 week(s).   Specialty: Pulmonary Disease Contact information: [address] [place] [address] [phone]              Allergies  Allergen Reactions  . Rivaroxaban Hives    Consultations:  Pulmonology    Procedures/Studies: DG Chest 1 View  Result Date: 06/02/2020 CLINICAL DATA:  Shortness of breath, right lobe biopsy performed Wednesday EXAM: CHEST  1 VIEW COMPARISON:  Radiograph 12/16/2019, CT 05/30/2020 FINDINGS: There is persistent atelectasis and/or consolidation in the left lung base with a left pleural  effusion as was seen on comparison CT imaging 3 days prior. Some additional bronchitic and bronchiectatic changes are seen diffusely throughout the lungs with some chronically coarsened interstitial changes and reticulonodular opacities throughout the lungs in a similar distribution to more remote comparison. No new consolidative opacity. No pneumothorax. No convincing CT features of edema at this time. The aorta is calcified. The remaining cardiomediastinal contours are unremarkable. No acute osseous or soft tissue abnormality. Degenerative changes are present in the imaged spine and shoulders. Telemetry leads and support devices overlie the chest. IMPRESSION: 1. Persistent atelectasis and/or consolidation in the left lung base with a left pleural effusion as was seen on comparison CT imaging. 2. Some chronic bronchitic/bronchiectatic changes with coarsened interstitium and diffuse reticulonodular opacities conspicuous for a long-standing/chronic atypical infection including mycobacterial etiologies. 3. No other acute cardiopulmonary abnormalities. 4. The aorta is calcified. The remaining cardiomediastinal contours are unremarkable. Electronically Signed   By: Lovena Le M.D.   On: 06/02/2020 05:39   CT CHEST WO CONTRAST  Result Date: 05/30/2020 CLINICAL DATA:  Shortness of breath.  Preop for bronchoscopy. EXAM: CT CHEST WITHOUT CONTRAST TECHNIQUE: Multidetector CT imaging of the chest was performed following the standard protocol without IV contrast. COMPARISON:  May 03, 2020. FINDINGS: Cardiovascular: Atherosclerosis of thoracic aorta is noted without aneurysm formation. Normal cardiac size. Small pericardial effusion is noted. Coronary artery calcifications are noted. Mediastinum/Nodes: As noted on prior exam, there is extensive mediastinal adenopathy present particularly in the right paratracheal pretracheal subcarinal and left hilar regions. This results in narrowing of the left mainstem bronchus.  Thyroid gland is unremarkable. Esophagus is unremarkable. Lungs/Pleura: No pneumothorax is noted. Mild biapical scarring is noted. Mild to moderate left pleural effusion is noted with adjacent left lower lobe postobstructive atelectasis or infiltrate. Upper Abdomen: No acute abnormality. Musculoskeletal: No chest wall mass or suspicious bone lesions identified. IMPRESSION: 1. Mild to moderate left pleural effusion is noted with adjacent left lower lobe postobstructive atelectasis or infiltrate. 2. Small pericardial effusion is noted. 3. Coronary artery calcifications are noted suggesting coronary artery disease. 4. As noted on prior exam, there is extensive mediastinal adenopathy present particularly in the right paratracheal, subcarinal and left hilar regions. This results in narrowing of the left mainstem bronchus. This is concerning for malignancy or metastatic disease. Aortic Atherosclerosis (ICD10-I70.0). Electronically Signed   By: Marijo Conception M.D.   On: 05/30/2020 09:32   MR BRAIN W WO CONTRAST  Result Date: 06/05/2020 CLINICAL DATA:  Small-cell lung cancer, staging EXAM: MRI HEAD WITHOUT AND WITH CONTRAST TECHNIQUE: Multiplanar, multiecho pulse sequences of the brain and surrounding structures were obtained without and with intravenous contrast. CONTRAST:  62m GADAVIST GADOBUTROL 1 MMOL/ML IV SOLN COMPARISON:  May 2019 FINDINGS: Motion  artifact is present. Findings below are within this limitation. Brain: There is no acute infarction or intracranial hemorrhage. There is no intracranial mass, mass effect, or edema. There is no hydrocephalus or extra-axial fluid collection. Prominence of the ventricles and sulci reflects generalized parenchymal volume loss. Small chronic infarcts of the right frontal lobe cortex and caudate. Additional patchy and confluent areas of T2 hyperintensity in the supratentorial and pontine white matter are nonspecific but probably reflect moderate chronic microvascular  ischemic changes. Appearance is similar to the prior study. No abnormal enhancement. Vascular: Major vessel flow voids at the skull base are preserved. Skull and upper cervical spine: Normal marrow signal is preserved. Sinuses/Orbits: Paranasal sinuses are aerated. Orbits are unremarkable. Other: Sella is unremarkable.  Mastoid air cells are clear. IMPRESSION: Suboptimal evaluation due to motion artifact. No evidence of intracranial metastatic disease. Small chronic infarcts and moderate chronic microvascular ischemic changes. Electronically Signed   By: Macy Mis M.D.   On: 06/05/2020 11:39   US Venous Img Lower Bilateral (DVT)  Result Date: 06/03/2020 CLINICAL DATA:  Shortness of breath EXAM: BILATERAL LOWER EXTREMITY VENOUS DOPPLER ULTRASOUND TECHNIQUE: Gray-scale sonography with compression, as well as color and duplex ultrasound, were performed to evaluate the deep venous system(s) from the level of the common femoral vein through the popliteal and proximal calf veins. COMPARISON:  None. FINDINGS: VENOUS Normal compressibility of the common femoral, superficial femoral, and popliteal veins, as well as the visualized calf veins. Visualized portions of profunda femoral vein and great saphenous vein unremarkable. No filling defects to suggest DVT on grayscale or color Doppler imaging. Doppler waveforms show normal direction of venous flow, normal respiratory plasticity and response to augmentation. Limited views of the contralateral common femoral vein are unremarkable. OTHER None. Limitations: none IMPRESSION: No acute deep vein thrombosis in the visualized bilateral lower extremities. Electronically Signed   By: Valentino Saxon MD   On: 06/03/2020 13:20   DG Chest Port 1 View  Result Date: 06/05/2020 CLINICAL DATA:  Shortness of breath EXAM: PORTABLE CHEST 1 VIEW COMPARISON:  06/02/2020 FINDINGS: Mild blunting of the left costophrenic angle is unchanged. Mild chronic bronchitic type changes.  Calcific aortic atherosclerosis. There is left retrocardiac consolidation/atelectasis, unchanged. IMPRESSION: Unchanged left retrocardiac consolidation/atelectasis. Electronically Signed   By: Ulyses Jarred M.D.   On: 06/05/2020 02:30     Subjective: Patient was seen and examined at bedside.  Overnight events noted.  Patient reports feeling much better,  breathing has improved and she wants to be discharged home.  Home health services been arranged.  Discharge Exam: Vitals:   06/09/20 0221 06/09/20 0910  BP: 129/64 111/63  Pulse: 74 66  Resp: 17 (!) 22  Temp: 98.4 F (36.9 C) (!) 97 F (36.1 C)  SpO2: 99% 100%   Vitals:   06/08/20 1714 06/08/20 2335 06/09/20 0221 06/09/20 0910  BP: 115/73 105/77 129/64 111/63  Pulse: 91 77 74 66  Resp: _0 (!) 22  Temp: 97.7 F (36.5 C) 97.8 F (36.6 C) 98.4 F (36.9 C) (!) 97 F (36.1 C)  TempSrc:    Axillary  SpO2: 98% 97% 99% 100%  Weight:      Height:        General: Pt is alert, awake, not in acute distress Cardiovascular: RRR, S1/S2 +, no rubs, no gallops Respiratory: CTA bilaterally, no wheezing, no rhonchi Abdominal: Soft, NT, ND, bowel sounds + Extremities: no edema, no cyanosis    The results of significant diagnostics from this hospitalization (  including imaging, microbiology, ancillary and laboratory) are listed below for reference.     Microbiology: Recent Results (from the past 240 hour(s))  Culture, respiratory     Status: None (Preliminary result)   Collection Time: 05/30/20  2:53 PM   Specimen: Bronchial Wash; Respiratory  Result Value Ref Range Status   Specimen Description   Final    BRONCHIAL WASHINGS Performed at [facility], [address] [place], [place] [address]    Special Requests   Final    NONE Performed at [facility], [address] [place], [place] [address]    Gram Stain   Final    MODERATE WBC PRESENT, PREDOMINANTLY PMN MODERATE GRAM NEGATIVE RODS    Culture    Final    MODERATE PSEUDOMONAS AERUGINOSA Sent to [facility] for further susceptibility testing. Performed at [facility], [place] [address], [place], [place] [address]    Report Status PENDING  Incomplete  Acid Fast Smear (AFB)     Status: None   Collection Time: 05/30/20  2:53 PM   Specimen: Bronchoalveolar Lavage; Respiratory  Result Value Ref Range Status   AFB Specimen Processing Concentration  Final   Acid Fast Smear Negative  Final    Comment: (NOTE) Performed At: [facility] [place], [place] [id number] Rush Farmer MD NL:[id number]    Source (AFB) BRONCHIAL ALVEOLAR LAVAGE  Final    Comment: Performed at [facility], [address] [place], [place] [address]  KOH prep     Status: None   Collection Time: 05/30/20  2:53 PM   Specimen: Bronchoalveolar Lavage  Result Value Ref Range Status   Specimen Description BRONCHIAL ALVEOLAR LAVAGE  Final   Special Requests NONE  Final   KOH Prep   Final    NO YEAST OR FUNGAL ELEMENTS SEEN Performed at [facility], [address], [place], [place] [address]    Report Status 05/30/2020 FINAL  Final  Aspergillus Ag, BAL/Serum     Status: None   Collection Time: 05/30/20  2:53 PM   Specimen: Bronchial Alveolar Lavage  Result Value Ref Range Status   Aspergillus Ag, BAL/Serum 0.05 0.00 - 0.49 Index Final    Comment: (NOTE) Performed At: [facility] [address] [place], [place] [id number] Rush Farmer MD [id number]   Anaerobic culture     Status: None   Collection Time: 05/30/20  2:53 PM   Specimen: Bronchial Wash; Lung  Result Value Ref Range Status   Specimen Description   Final    BRONCHIAL WASHINGS Performed at [facility], [address] [place], [place] [address]    Special Requests   Final    NONE Performed at [facility], [address] [place], [place] [address]    Culture   Final    NO ANAEROBES ISOLATED Performed at [facility], [place] [address], [place], [place] [address]    Report Status 06/05/2020 FINAL  Final  Fungus Culture With Stain     Status: None (Preliminary result)   Collection Time: 05/30/20  2:53 PM  Result Value Ref Range Status   Fungus Stain Final report  Final    Comment: (NOTE) Performed At: [facility] [place], [place] [id number] Rush Farmer MD YO:[id number]    Fungus (Mycology) Culture PENDING  Incomplete   Fungal Source BRONCHIAL ALVEOLAR LAVAGE  Final    Comment: Performed at [facility], [place] [address]  [place], [place] [address]  Susceptibility, Aer + Anaerob     Status: Abnormal   Collection Time: 05/30/20  2:53 PM  Result Value Ref Range Status   Suscept, Aer + Anaerob Final report (A)  Final    Comment: (NOTE) Performed At: [facility] [place], [place] [id number] Rush Farmer MD XN:[id number]    Source of Sample [phone]  Final    Comment: SUSCEPTIBILITY FOR P.AERUGINOSA BRONCHIAL [facility] Performed at [facility], [place] [address] [place], [place] [address]   Fungus Culture Result     Status: None   Collection Time: 05/30/20  2:53 PM  Result Value Ref Range Status   Result 1 Comment  Final    Comment: (NOTE) KOH/Calcofluor preparation:  no fungus observed. Performed At: [facility] [place], [place] [id number] Rush Farmer MD SV:[id number]   Susceptibility Result     Status: Abnormal   Collection Time: 05/30/20  2:53 PM  Result Value Ref Range Status   Suscept Result 1 Comment (A)  Final    Comment: (NOTE) Pseudomonas aeruginosa Identification performed by account, not confirmed by this laboratory.    Antimicrobial Suscept Comment  Final    Comment: (NOTE)      ** S = Susceptible; I = Intermediate; R = Resistant **                   P = Positive; N = Negative            MICS are expressed in micrograms per mL   Antibiotic                 RSLT#1    RSLT#2    RSLT#3     RSLT#4 Amikacin                       I Cefepime                       S Ceftazidime                    S Ciprofloxacin                  S Gentamicin                     I Imipenem                       S Levofloxacin                   S Meropenem                      S Piperacillin                   S Ticarcillin                    S Tobramycin                     S Performed At: [facility] [employer] [place], [place] [id number] Rush Farmer MD QJ:[id number]   Blood culture (routine x 2)     Status: None   Collection Time: 06/02/20  4:51 AM   Specimen: BLOOD  Result Value Ref Range Status   Specimen Description BLOOD RIGHT ANTECUBITAL  Final   Special Requests  Final    BOTTLES DRAWN AEROBIC AND ANAEROBIC Blood Culture adequate volume   Culture   Final    NO GROWTH 5 DAYS Performed at [facility], [address] [place], [place] [address]    Report Status 06/07/2020 FINAL  Final  Blood culture (routine x 2)     Status: None   Collection Time: 06/02/20  4:51 AM   Specimen: BLOOD  Result Value Ref Range Status   Specimen Description BLOOD BLOOD RIGHT FOREARM  Final   Special Requests   Final    BOTTLES DRAWN AEROBIC AND ANAEROBIC Blood Culture results may not be optimal due to an excessive volume of blood received in culture bottles   Culture   Final    NO GROWTH 5 DAYS Performed at [facility], [address] [place], [place] [address]    Report Status 06/07/2020 FINAL  Final  Resp Panel by RT-PCR (Flu A&B, Covid) Nasopharyngeal Swab     Status: None   Collection Time: 06/02/20  4:51 AM   Specimen: Nasopharyngeal Swab; Nasopharyngeal(NP) swabs in vial transport medium  Result Value Ref Range Status   SARS Coronavirus 2 by RT PCR NEGATIVE NEGATIVE Final    Comment: (NOTE) SARS-CoV-2 target nucleic acids are NOT DETECTED.  The SARS-CoV-2 RNA is generally detectable in upper respiratory specimens during the acute phase of  infection. The lowest concentration of SARS-CoV-2 viral copies this assay can detect is 138 copies/mL. A negative result does not preclude SARS-Cov-2 infection and should not be used as the sole basis for treatment or other patient management decisions. A negative result may occur with  improper specimen collection/handling, submission of specimen other than nasopharyngeal swab, presence of viral mutation(s) within the areas targeted by this assay, and inadequate number of viral copies(<138 copies/mL). A negative result must be combined with clinical observations, patient history, and epidemiological information. The expected result is Negative.  Fact Sheet for Patients:  EntrepreneurPulse.com.au  Fact Sheet for Healthcare Providers:  IncredibleEmployment.be  This test is no t yet approved or cleared by the [place] FDA and  has been authorized for detection and/or diagnosis of SARS-CoV-2 by FDA under an Emergency Use Authorization (EUA). This EUA will remain  in effect (meaning this test can be used) for the duration of the COVID-19 declaration under Section 564(b)(1) of the Act, 21 U.S.C.section 360bbb-3(b)(1), unless the authorization is terminated  or revoked sooner.       Influenza A by PCR NEGATIVE NEGATIVE Final   Influenza B by PCR NEGATIVE NEGATIVE Final    Comment: (NOTE) The Xpert Xpress SARS-CoV-2/FLU/RSV plus assay is intended as an aid in the diagnosis of influenza from Nasopharyngeal swab specimens and should not be used as a sole basis for treatment. Nasal washings and aspirates are unacceptable for Xpert Xpress SARS-CoV-2/FLU/RSV testing.  Fact Sheet for Patients: EntrepreneurPulse.com.au  Fact Sheet for Healthcare Providers: IncredibleEmployment.be  This test is not yet approved or cleared by the [place] FDA and has been authorized for detection and/or diagnosis of SARS-CoV-2  by FDA under an Emergency Use Authorization (EUA). This EUA will remain in effect (meaning this test can be used) for the duration of the COVID-19 declaration under Section 564(b)(1) of the Act, 21 U.S.C. section 360bbb-3(b)(1), unless the authorization is terminated or revoked.  Performed at [facility], [address] [place], [place] [address]      Labs: BNP (last 3 results) Recent Labs    06/02/20 0451  BNP 129.7*  Basic Metabolic Panel: Recent Labs  Lab 06/05/20 0449 06/06/20 0625 06/07/20 0605 06/08/20 0441 06/09/20 0524  NA 138 141 138 138 138  K 4.2 3.9 3.3* 3.6 3.5  CL 99 102 99 99 100  CO2 _0 32 29  GLUCOSE 113* 107* 111* 139* 123*  BUN 25* 32* 33* 35* 35*  CREATININE 0.72 0.89 0.73 0.92 0.92  CALCIUM 10.2 9.9 9.8 9.6 9.6  MG 1.9 2.0 2.0 1.9 1.9  PHOS  --   --  2.1* 1.9* 2.0*   Liver Function Tests: Recent Labs  Lab 06/07/20 0605  AST 18  ALT 13  ALKPHOS 31*  BILITOT 0.4  PROT 6.5  ALBUMIN 3.0*   No results for input(s): LIPASE, AMYLASE in the last 168 hours. No results for input(s): AMMONIA in the last 168 hours. CBC: Recent Labs  Lab 06/05/20 0449 06/06/20 0625 06/07/20 0605 06/08/20 0441 06/09/20 0524  WBC 10.3 10.7* 9.7 9.6 10.8*  HGB 8.5* 9.3* 9.6* 9.6* 10.3*  HCT 27.2* 29.5* 30.3* 29.8* 30.5*  MCV 90.7 89.4 89.4 89.2 88.9  PLT 391 394 405* 405* 444*   Cardiac Enzymes: No results for input(s): CKTOTAL, CKMB, CKMBINDEX, TROPONINI in the last 168 hours. BNP: Invalid input(s): POCBNP CBG: No results for input(s): GLUCAP in the last 168 hours. D-Dimer No results for input(s): DDIMER in the last 72 hours. Hgb A1c No results for input(s): HGBA1C in the last 72 hours. Lipid Profile No results for input(s): CHOL, HDL, LDLCALC, TRIG, CHOLHDL, LDLDIRECT in the last 72 hours. Thyroid function studies No results for input(s): TSH, T4TOTAL, T3FREE, THYROIDAB in the last 72 hours.  Invalid input(s): FREET3 Anemia  work up No results for input(s): VITAMINB12, FOLATE, FERRITIN, TIBC, IRON, RETICCTPCT in the last 72 hours. Urinalysis    Component Value Date/Time   COLORURINE YELLOW (A) 10/11/2017 0223   APPEARANCEUR HAZY (A) 10/11/2017 0223   APPEARANCEUR Clear 09/30/2012 0829   LABSPEC 1.013 10/11/2017 0223   LABSPEC 1.012 09/30/2012 0829   PHURINE 6.0 10/11/2017 0223   GLUCOSEU NEGATIVE 10/11/2017 0223   GLUCOSEU 50 mg/dL 09/30/2012 0829   HGBUR SMALL (A) 10/11/2017 0223   BILIRUBINUR NEGATIVE 10/11/2017 0223   BILIRUBINUR Negative 09/30/2012 0829   KETONESUR NEGATIVE 10/11/2017 0223   PROTEINUR NEGATIVE 10/11/2017 0223   NITRITE POSITIVE (A) 10/11/2017 0223   LEUKOCYTESUR SMALL (A) 10/11/2017 0223   LEUKOCYTESUR Negative 09/30/2012 0829   Sepsis Labs Invalid input(s): PROCALCITONIN,  WBC,  LACTICIDVEN Microbiology Recent Results (from the past 240 hour(s))  Culture, respiratory     Status: None (Preliminary result)   Collection Time: 05/30/20  2:53 PM   Specimen: Bronchial Wash; Respiratory  Result Value Ref Range Status   Specimen Description   Final    BRONCHIAL WASHINGS Performed at [facility], [address], [place], [place] [address]    Special Requests   Final    NONE Performed at [facility], [address] [place], [place] [address]    Gram Stain   Final    MODERATE WBC PRESENT, PREDOMINANTLY PMN MODERATE GRAM NEGATIVE RODS    Culture   Final    MODERATE PSEUDOMONAS AERUGINOSA Sent to [place] for further susceptibility testing. Performed at [facility], [place] [address] [place], [place] [address]    Report Status PENDING  Incomplete  Acid Fast Smear (AFB)     Status: None   Collection Time: 05/30/20  2:53 PM   Specimen: Bronchoalveolar Lavage; Respiratory  Result Value  Ref Range Status   AFB Specimen Processing Concentration  Final   Acid Fast Smear Negative  Final    Comment: (NOTE) Performed At: [facility] [place], [place] [id number] Rush Farmer MD IR:[id number]    Source (AFB) BRONCHIAL ALVEOLAR LAVAGE  Final    Comment: Performed at [facility], [address] [place], [place] [address]  KOH prep     Status: None   Collection Time: 05/30/20  2:53 PM   Specimen: Bronchoalveolar Lavage  Result Value Ref Range Status   Specimen Description BRONCHIAL ALVEOLAR LAVAGE  Final   Special Requests NONE  Final   KOH Prep   Final    NO YEAST OR FUNGAL ELEMENTS SEEN Performed at [facility], [address] [place], [place] [address]    Report Status 05/30/2020 FINAL  Final  Aspergillus Ag, BAL/Serum     Status: None   Collection Time: 05/30/20  2:53 PM   Specimen: Bronchial Alveolar Lavage  Result Value Ref Range Status   Aspergillus Ag, BAL/Serum 0.05 0.00 - 0.49 Index Final    Comment: (NOTE) Performed At: [facility] [address] [place], [place] [id number] Rush Farmer MD FY:[id number]   Anaerobic culture     Status: None   Collection Time: 05/30/20  2:53 PM   Specimen: Bronchial Wash; Lung  Result Value Ref Range Status   Specimen Description   Final    BRONCHIAL WASHINGS Performed at [facility], [address], [place], [place] [address]    Special Requests   Final    NONE Performed at [facility], [address], [place], [place] [address]    Culture   Final    NO ANAEROBES ISOLATED Performed at [facility], [place] [address], [place], [place] [address]    Report Status 06/05/2020 FINAL  Final  Fungus Culture With Stain     Status: None (Preliminary result)   Collection Time: 05/30/20  2:53 PM  Result Value Ref Range Status   Fungus Stain Final report  Final    Comment: (NOTE) Performed At: [facility] [place], [place] [id number] Rush Farmer MD YP:[id number]    Fungus (Mycology) Culture PENDING  Incomplete   Fungal Source BRONCHIAL ALVEOLAR LAVAGE  Final    Comment:  Performed at [facility], [place] [address] [place], [place] [address]  Susceptibility, Aer + Anaerob     Status: Abnormal   Collection Time: 05/30/20  2:53 PM  Result Value Ref Range Status   Suscept, Aer + Anaerob Final report (A)  Final    Comment: (NOTE) Performed At: [facility] [place], [place] [id number] Rush Farmer MD LZ:[id number]    Source of Sample [phone]  Final    Comment: SUSCEPTIBILITY FOR P.AERUGINOSA BRONCHIAL [facility] Performed at [facility], [place] [address], [place], [place] [address]   Fungus Culture Result     Status: None   Collection Time: 05/30/20  2:53 PM  Result Value Ref Range Status   Result 1 Comment  Final    Comment: (NOTE) KOH/Calcofluor preparation:  no fungus observed. Performed At: [facility] [place], [place] [id number] Rush Farmer MD MH:[id number]   Susceptibility Result     Status: Abnormal   Collection Time: 05/30/20  2:53 PM  Result Value Ref Range Status   Suscept Result 1 Comment (A)  Final    Comment: (NOTE) Pseudomonas aeruginosa Identification performed by  account, not confirmed by this laboratory.    Antimicrobial Suscept Comment  Final    Comment: (NOTE)      ** S = Susceptible; I = Intermediate; R = Resistant **                   P = Positive; N = Negative            MICS are expressed in micrograms per mL   Antibiotic                 RSLT#1    RSLT#2    RSLT#3    RSLT#4 Amikacin                       I Cefepime                       S Ceftazidime                    S Ciprofloxacin                  S Gentamicin                     I Imipenem                       S Levofloxacin                   S Meropenem                      S Piperacillin                   S Ticarcillin                    S Tobramycin                     S Performed At: [facility] [employer] [place], [place] [id number] Rush Farmer MD PQ:[id number]   Blood culture  (routine x 2)     Status: None   Collection Time: 06/02/20  4:51 AM   Specimen: BLOOD  Result Value Ref Range Status   Specimen Description BLOOD RIGHT ANTECUBITAL  Final   Special Requests   Final    BOTTLES DRAWN AEROBIC AND ANAEROBIC Blood Culture adequate volume   Culture   Final    NO GROWTH 5 DAYS Performed at [facility], [address] [place], [place] [address]    Report Status 06/07/2020 FINAL  Final  Blood culture (routine x 2)     Status: None   Collection Time: 06/02/20  4:51 AM   Specimen: BLOOD  Result Value Ref Range Status   Specimen Description BLOOD BLOOD RIGHT FOREARM  Final   Special Requests   Final    BOTTLES DRAWN AEROBIC AND ANAEROBIC Blood Culture results may not be optimal due to an excessive volume of blood received in culture bottles   Culture   Final    NO GROWTH 5 DAYS Performed at [facility], [address] [place], [place] [address]    Report Status 06/07/2020 FINAL  Final  Resp Panel by RT-PCR (Flu A&B, Covid) Nasopharyngeal Swab     Status: None   Collection Time: 06/02/20  4:51 AM   Specimen: Nasopharyngeal Swab; Nasopharyngeal(NP) swabs in vial transport medium  Result Value Ref Range Status   SARS Coronavirus 2 by RT PCR NEGATIVE NEGATIVE Final    Comment: (NOTE) SARS-CoV-2 target nucleic acids are NOT DETECTED.  The SARS-CoV-2 RNA is generally detectable in upper respiratory specimens during the acute phase of infection. The lowest concentration of SARS-CoV-2 viral copies this assay can detect is 138 copies/mL. A negative result does not preclude SARS-Cov-2 infection and should not be used as the sole basis for treatment or other patient management decisions. A negative result may occur with  improper specimen collection/handling, submission of specimen other than nasopharyngeal swab, presence of viral mutation(s) within the areas targeted by this assay, and inadequate number of viral copies(<138 copies/mL). A  negative result must be combined with clinical observations, patient history, and epidemiological information. The expected result is Negative.  Fact Sheet for Patients:  EntrepreneurPulse.com.au  Fact Sheet for Healthcare Providers:  IncredibleEmployment.be  This test is no t yet approved or cleared by the [place] FDA and  has been authorized for detection and/or diagnosis of SARS-CoV-2 by FDA under an Emergency Use Authorization (EUA). This EUA will remain  in effect (meaning this test can be used) for the duration of the COVID-19 declaration under Section 564(b)(1) of the Act, 21 U.S.C.section 360bbb-3(b)(1), unless the authorization is terminated  or revoked sooner.       Influenza A by PCR NEGATIVE NEGATIVE Final   Influenza B by PCR NEGATIVE NEGATIVE Final    Comment: (NOTE) The Xpert Xpress SARS-CoV-2/FLU/RSV plus assay is intended as an aid in the diagnosis of influenza from Nasopharyngeal swab specimens and should not be used as a sole basis for treatment. Nasal washings and aspirates are unacceptable for Xpert Xpress SARS-CoV-2/FLU/RSV testing.  Fact Sheet for Patients: EntrepreneurPulse.com.au  Fact Sheet for Healthcare Providers: IncredibleEmployment.be  This test is not yet approved or cleared by the [place] FDA and has been authorized for detection and/or diagnosis of SARS-CoV-2 by FDA under an Emergency Use Authorization (EUA). This EUA will remain in effect (meaning this test can be used) for the duration of the COVID-19 declaration under Section 564(b)(1) of the Act, 21 U.S.C. section 360bbb-3(b)(1), unless the authorization is terminated or revoked.  Performed at [facility], [address] [place], [place] [address]      Time coordinating discharge: Over 30 minutes  SIGNED:   Shawna Clamp, MD  [facility] 06/09/2020, 11:09 AM Pager   If  7PM-7AM, please contact night-coverage [email or web address]

## 2020-06-09 NOTE — Discharge Instructions (Signed)
Advised to follow-up with primary care physician in 1 week.   Advised to continue prednisone taper as directed Advised to follow-up pulmonology as scheduled.

## 2020-06-10 DIAGNOSIS — J441 Chronic obstructive pulmonary disease with (acute) exacerbation: Secondary | ICD-10-CM | POA: Diagnosis not present

## 2020-06-10 LAB — MAGNESIUM: Magnesium: 1.9 mg/dL (ref 1.7–2.4)

## 2020-06-10 MED ORDER — ACETAMINOPHEN 325 MG PO TABS
650.0000 mg | ORAL_TABLET | Freq: Four times a day (QID) | ORAL | Status: DC | PRN
  Administered 2020-06-10: 650 mg via ORAL
  Filled 2020-06-10: qty 2

## 2020-06-10 MED ORDER — PANTOPRAZOLE SODIUM 40 MG IV SOLR
40.0000 mg | INTRAVENOUS | Status: DC
  Administered 2020-06-10 – 2020-06-11 (×2): 40 mg via INTRAVENOUS
  Filled 2020-06-10 (×2): qty 40

## 2020-06-10 NOTE — Progress Notes (Signed)
PROGRESS NOTE    Christine Morris  XAJ:[id number] DOB: May 26, 1941 DOA: 06/02/2020 PCP: Chase Picket, MD   Brief Narrative:  This 80 years old female with PMH significant for severe COPD on home oxygen 4 L, paroxysmal A. fib on DOAC, hypothyroidism, recent diagnosis of small cell lung cancer who presents with worsening shortness of breath.  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  Patient is admitted for acute on chronic hypoxic and hypercapnic respiratory failure secondary to COPD exacerbation.  She has improved and she is at her baseline oxygen requirement.  She has completed antibiotic treatment for possible pneumonia.  Patient was discharged yesterday but has developed dizziness and shortness of breath.  Assessment & Plan:   Active Problems:   CVA (cerebral vascular accident) ([place])   AF (paroxysmal atrial fibrillation) (HCC)   COPD with chronic bronchitis (HCC)   Chronic respiratory failure with hypoxia (HCC)   COPD (chronic obstructive pulmonary disease) with acute bronchitis (HCC)   Acute on chronic respiratory failure (HCC)   Lung cancer (HCC)   COPD with acute exacerbation (HCC)   Pressure injury of skin   Acute on chronic hypoxic hypercapnic respiratory failure sec to COPD with acute exacerbation Patient presented with increased work of breathing, she was tachypneic,  She was requiring 5 L of supplemental oxygen to maintain saturation above 94%. Flu/covid neg.  Pt was put on BiPAP. Continue methylprednisone 60 iv q6, DuoNeb. Pulm consulted.  Recommended to continue Levaquin and Solu-Medrol Weaned off BiPAP and to 4L today. Start taperring from IV solumedrol to prednisone 50 mg daily Continue with scheduled DuoNeb Continue home breo, daliresp, incurse, singulair Continue supplemental O2 to keep sats between 88-92%, wean as  tolerated. She is weaned down to 3 L/min sats 94%.  Sepsis 2/2 pseudomonas PNA Patient was found to have tachycardia, tachypnea, leukocytosis.  No fever, neg procal.  Bronch lavage positive for pseudomonas She was started onzosynand levaquin on admission (discussed w/ Raul Del who advises double coverage for now) --zosyn d/c'ed. She has completed antibiotic treatment for possible pneumonia. Sepsis physiology has resolved.  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 Janese Banks to arrange chemo/RT --oupatient palliative care consult  Hypokalemia >>> resolved Hypomagnesemia >>> resolved  History CVA Cont simvastatin  Parosyxmal a fib Here in sinus rhythm cont home Eliquis.  Hypothyroidism - cont home synthroid  MDD - cont home lexapro  Anxiety --complicating respiratory status --Continue IV ativan 1mg  q6h PRN   DVT prophylaxis: Eliquis Code Status: Full code Family Communication: No family at bedside Disposition Plan:  Status is: Inpatient  Remains inpatient appropriate because:Inpatient level of care appropriate due to severity of illness   Dispo: The patient is from: Home              Anticipated d/c is to:  SNF/ Home with home health services              Anticipated d/c date is:  1 day              Patient currently is  medically stable to d/c.  Consultants:   Pulmonology  Procedures: None  Antimicrobials:  Anti-infectives (From admission, onward)   Start     Dose/Rate Route Frequency Ordered Stop   06/04/20 1300  levofloxacin (LEVAQUIN) tablet 750 mg        750 mg Oral  Daily 06/04/20 1204 06/06/20 0924   06/03/20 0600  levofloxacin (LEVAQUIN) IVPB 750 mg  Status:  Discontinued        750 mg 100 mL/hr over 90 Minutes Intravenous Every 24 hours 06/02/20 1357 06/02/20 1529   06/03/20 0600  levofloxacin (LEVAQUIN) IVPB 750 mg  Status:  Discontinued        750 mg 100 mL/hr over 90 Minutes Intravenous Every 48  hours 06/02/20 1529 06/04/20 1203   06/02/20 0830  piperacillin-tazobactam (ZOSYN) IVPB 3.375 g  Status:  Discontinued        3.375 g 12.5 mL/hr over 240 Minutes Intravenous Every 8 hours 06/02/20 0823 06/04/20 1152   06/02/20 0515  cefTRIAXone (ROCEPHIN) 1 g in sodium chloride 0.9 % 100 mL IVPB        1 g 200 mL/hr over 30 Minutes Intravenous  Once 06/02/20 0510 06/02/20 0613   06/02/20 0515  azithromycin (ZITHROMAX) 500 mg in sodium chloride 0.9 % 250 mL IVPB        500 mg 250 mL/hr over 60 Minutes Intravenous  Once 06/02/20 0510 06/02/20 0800     Subjective: Patient was seen and examined at bedside.  Overnight events noted.  Patient was discharged yesterday but she has developed dizziness and shortness of breath while transport.  Discharge was held.  She reports feeling better,  breathing has improved. She is on 3 L O2 saturation is 94%.  She reports nausea,  denies any vomiting.  Objective: Vitals:   06/10/20 0445 06/10/20 0445 06/10/20 0740 06/10/20 0904  BP: 129/72 129/72  114/73  Pulse: 72 72  79  Resp: 17 17  18   Temp: 98.2 F (36.8 C) 98.2 F (36.8 C)  97.7 F (36.5 C)  TempSrc:    Oral  SpO2: 99% 99% 97% 96%  Weight:      Height:        Intake/Output Summary (Last 24 hours) at 06/10/2020 1029 Last data filed at 06/10/2020 1018 Gross per 24 hour  Intake 720 ml  Output --  Net 720 ml   Filed Weights   06/05/20 0331 06/06/20 0500 06/07/20 0313  Weight: 43 kg 45.2 kg 45.3 kg    Examination:  General exam: Appears calm and comfortable, not in any acute distress. Respiratory system: Clear to auscultation. Respiratory effort normal. Cardiovascular system: S1 & S2 heard, RRR. No JVD, murmurs, rubs, gallops or clicks. No pedal edema. Gastrointestinal system: Abdomen is nondistended, soft and nontender. No organomegaly or masses felt. Normal bowel sounds heard. Central nervous system: Alert and oriented. No focal neurological deficits. Extremities: Symmetric 5 x 5  power.  No edema, no cyanosis, no clubbing. Skin: No rashes, lesions or ulcers Psychiatry: Judgement and insight appear normal. Mood & affect appropriate.     Data Reviewed: I have personally reviewed following labs and imaging studies  CBC: Recent Labs  Lab 06/05/20 0449 06/06/20 0625 06/07/20 0605 06/08/20 0441 06/09/20 0524  WBC 10.3 10.7* 9.7 9.6 10.8*  HGB 8.5* 9.3* 9.6* 9.6* 10.3*  HCT 27.2* 29.5* 30.3* 29.8* 30.5*  MCV 90.7 89.4 89.4 89.2 88.9  PLT 391 394 405* 405* 620*   Basic Metabolic Panel: Recent Labs  Lab 06/05/20 0449 06/06/20 0625 06/07/20 0605 06/08/20 0441 06/09/20 0524 06/10/20 0458  NA 138 141 138 138 138  --   K 4.2 3.9 3.3* 3.6 3.5  --   CL 99 102 99 99 100  --   CO2 28 31 28  32 29  --  GLUCOSE 113* 107* 111* 139* 123*  --   BUN 25* 32* 33* 35* 35*  --   CREATININE 0.72 0.89 0.73 0.92 0.92  --   CALCIUM 10.2 9.9 9.8 9.6 9.6  --   MG 1.9 2.0 2.0 1.9 1.9 1.9  PHOS  --   --  2.1* 1.9* 2.0*  --    GFR: Estimated Creatinine Clearance: 36 mL/min (by C-G formula based on SCr of 0.92 mg/dL). Liver Function Tests: Recent Labs  Lab 06/07/20 0605  AST 18  ALT 13  ALKPHOS 31*  BILITOT 0.4  PROT 6.5  ALBUMIN 3.0*   No results for input(s): LIPASE, AMYLASE in the last 168 hours. No results for input(s): AMMONIA in the last 168 hours. Coagulation Profile: No results for input(s): INR, PROTIME in the last 168 hours. Cardiac Enzymes: No results for input(s): CKTOTAL, CKMB, CKMBINDEX, TROPONINI in the last 168 hours. BNP (last 3 results) No results for input(s): PROBNP in the last 8760 hours. HbA1C: No results for input(s): HGBA1C in the last 72 hours. CBG: No results for input(s): GLUCAP in the last 168 hours. Lipid Profile: No results for input(s): CHOL, HDL, LDLCALC, TRIG, CHOLHDL, LDLDIRECT in the last 72 hours. Thyroid Function Tests: No results for input(s): TSH, T4TOTAL, FREET4, T3FREE, THYROIDAB in the last 72 hours. Anemia  Panel: No results for input(s): VITAMINB12, FOLATE, FERRITIN, TIBC, IRON, RETICCTPCT in the last 72 hours. Sepsis Labs: No results for input(s): PROCALCITON, LATICACIDVEN in the last 168 hours.  Recent Results (from the past 240 hour(s))  Blood culture (routine x 2)     Status: None   Collection Time: 06/02/20  4:51 AM   Specimen: BLOOD  Result Value Ref Range Status   Specimen Description BLOOD RIGHT ANTECUBITAL  Final   Special Requests   Final    BOTTLES DRAWN AEROBIC AND ANAEROBIC Blood Culture adequate volume   Culture   Final    NO GROWTH 5 DAYS Performed at [facility], [address] [place], [place] [address]    Report Status 06/07/2020 FINAL  Final  Blood culture (routine x 2)     Status: None   Collection Time: 06/02/20  4:51 AM   Specimen: BLOOD  Result Value Ref Range Status   Specimen Description BLOOD BLOOD RIGHT FOREARM  Final   Special Requests   Final    BOTTLES DRAWN AEROBIC AND ANAEROBIC Blood Culture results may not be optimal due to an excessive volume of blood received in culture bottles   Culture   Final    NO GROWTH 5 DAYS Performed at [facility], [address] [place], [place] [address]    Report Status 06/07/2020 FINAL  Final  Resp Panel by RT-PCR (Flu A&B, Covid) Nasopharyngeal Swab     Status: None   Collection Time: 06/02/20  4:51 AM   Specimen: Nasopharyngeal Swab; Nasopharyngeal(NP) swabs in vial transport medium  Result Value Ref Range Status   SARS Coronavirus 2 by RT PCR NEGATIVE NEGATIVE Final    Comment: (NOTE) SARS-CoV-2 target nucleic acids are NOT DETECTED.  The SARS-CoV-2 RNA is generally detectable in upper respiratory specimens during the acute phase of infection. The lowest concentration of SARS-CoV-2 viral copies this assay can detect is 138 copies/mL. A negative result does not preclude SARS-Cov-2 infection and should not be used as the sole basis for treatment or other patient management decisions. A  negative result may occur with  improper specimen collection/handling, submission of specimen other than nasopharyngeal  swab, presence of viral mutation(s) within the areas targeted by this assay, and inadequate number of viral copies(<138 copies/mL). A negative result must be combined with clinical observations, patient history, and epidemiological information. The expected result is Negative.  Fact Sheet for Patients:  EntrepreneurPulse.com.au  Fact Sheet for Healthcare Providers:  IncredibleEmployment.be  This test is no t yet approved or cleared by the [place] FDA and  has been authorized for detection and/or diagnosis of SARS-CoV-2 by FDA under an Emergency Use Authorization (EUA). This EUA will remain  in effect (meaning this test can be used) for the duration of the COVID-19 declaration under Section 564(b)(1) of the Act, 21 U.S.C.section 360bbb-3(b)(1), unless the authorization is terminated  or revoked sooner.       Influenza A by PCR NEGATIVE NEGATIVE Final   Influenza B by PCR NEGATIVE NEGATIVE Final    Comment: (NOTE) The Xpert Xpress SARS-CoV-2/FLU/RSV plus assay is intended as an aid in the diagnosis of influenza from Nasopharyngeal swab specimens and should not be used as a sole basis for treatment. Nasal washings and aspirates are unacceptable for Xpert Xpress SARS-CoV-2/FLU/RSV testing.  Fact Sheet for Patients: EntrepreneurPulse.com.au  Fact Sheet for Healthcare Providers: IncredibleEmployment.be  This test is not yet approved or cleared by the [place] FDA and has been authorized for detection and/or diagnosis of SARS-CoV-2 by FDA under an Emergency Use Authorization (EUA). This EUA will remain in effect (meaning this test can be used) for the duration of the COVID-19 declaration under Section 564(b)(1) of the Act, 21 U.S.C. section 360bbb-3(b)(1), unless the authorization  is terminated or revoked.  Performed at [facility], [address], [place], [place] [address]      Radiology Studies: No results found.  Scheduled Meds: . apixaban  5 mg Oral BID  . escitalopram  20 mg Oral Daily  . fluticasone furoate-vilanterol  1 puff Inhalation Daily  . furosemide  20 mg Intravenous Daily  . influenza vaccine adjuvanted  0.5 mL Intramuscular Tomorrow-1000  . ipratropium-albuterol  3 mL Nebulization TID  . levothyroxine  75 mcg Oral Q0600  . phosphorus  250 mg Oral TID  . predniSONE  30 mg Oral Q breakfast   Followed by  . [START ON 06/12/2020] predniSONE  20 mg Oral Q breakfast   Followed by  . [START ON 06/14/2020] predniSONE  10 mg Oral Q breakfast  . promethazine  6.25 mg Intravenous BID  . scopolamine  1 patch Transdermal Q72H  . simvastatin  40 mg Oral QPM  . sodium chloride flush  3 mL Intravenous Q12H  . umeclidinium bromide  1 puff Inhalation Daily   Continuous Infusions: . sodium chloride 250 mL (06/04/20 0619)     LOS: 8 days    Time spent: 25 mins    Debra Colon, MD [facility]   If 7PM-7AM, please contact night-coverage
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
## 2020-06-11 DIAGNOSIS — J9622 Acute and chronic respiratory failure with hypercapnia: Secondary | ICD-10-CM | POA: Diagnosis not present

## 2020-06-11 DIAGNOSIS — J9621 Acute and chronic respiratory failure with hypoxia: Secondary | ICD-10-CM | POA: Diagnosis not present

## 2020-06-11 LAB — MAGNESIUM: Magnesium: 1.9 mg/dL (ref 1.7–2.4)

## 2020-06-11 LAB — BASIC METABOLIC PANEL
Anion gap: 10 (ref 5–15)
BUN: 35 mg/dL — ABNORMAL HIGH (ref 8–23)
CO2: 34 mmol/L — ABNORMAL HIGH (ref 22–32)
Calcium: 9.1 mg/dL (ref 8.9–10.3)
Chloride: 94 mmol/L — ABNORMAL LOW (ref 98–111)
Creatinine, Ser: 0.76 mg/dL (ref 0.44–1.00)
GFR, Estimated: >60 mL/min (ref >60)
Glucose, Bld: 89 mg/dL (ref 70–99)
Potassium: 2.4 mmol/L — CL (ref 3.5–5.1)
Sodium: 138 mmol/L (ref 135–145)

## 2020-06-11 LAB — CBC
HCT: 31.2 % — ABNORMAL LOW (ref 36.0–46.0)
Hemoglobin: 9.6 g/dL — ABNORMAL LOW (ref 12.0–15.0)
MCH: 27.7 pg (ref 26.0–34.0)
MCHC: 30.8 g/dL (ref 30.0–36.0)
MCV: 89.9 fL (ref 80.0–100.0)
Platelets: 375 10*3/uL (ref 150–400)
RBC: 3.47 MIL/uL — ABNORMAL LOW (ref 3.87–5.11)
RDW: 14.8 % (ref 11.5–15.5)
WBC: 15.1 10*3/uL — ABNORMAL HIGH (ref 4.0–10.5)
nRBC: 0 % (ref 0.0–0.2)

## 2020-06-11 LAB — PHOSPHORUS: Phosphorus: 3.4 mg/dL (ref 2.5–4.6)

## 2020-06-11 MED ORDER — POTASSIUM CHLORIDE 20 MEQ PO PACK
40.0000 meq | PACK | Freq: Once | ORAL | Status: AC
  Administered 2020-06-11: 40 meq via ORAL

## 2020-06-11 MED ORDER — POTASSIUM CHLORIDE 10 MEQ/100ML IV SOLN
10.0000 meq | INTRAVENOUS | Status: AC
Start: 2020-06-11 — End: 2020-06-11
  Administered 2020-06-11 (×4): 10 meq via INTRAVENOUS
  Filled 2020-06-11 (×4): qty 100

## 2020-06-11 MED ORDER — SODIUM CHLORIDE 0.9 % IV BOLUS
500.0000 mL | Freq: Once | INTRAVENOUS | Status: AC
  Administered 2020-06-11: 500 mL via INTRAVENOUS

## 2020-06-11 MED ORDER — POTASSIUM CHLORIDE CRYS ER 20 MEQ PO TBCR
40.0000 meq | EXTENDED_RELEASE_TABLET | Freq: Once | ORAL | Status: AC
  Administered 2020-06-11: 40 meq via ORAL
  Filled 2020-06-11: qty 2

## 2020-06-11 NOTE — Progress Notes (Signed)
PROGRESS NOTE    Christine Morris  SLH:[id number] DOB: 07-Feb-1941 DOA: 06/02/2020 PCP: Chase Picket, MD   Brief Narrative:  This 80 years old female with PMH significant for severe COPD on home oxygen 4 L, paroxysmal A. fib on DOAC, hypothyroidism, recent diagnosis of small cell lung cancer who presents with worsening shortness of breath.  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  Patient is admitted for acute on chronic hypoxic and hypercapnic respiratory failure secondary to COPD exacerbation.  She has improved and she is at her baseline oxygen requirement.  She has completed antibiotic treatment for possible pneumonia.  Patient was discharged yesterday but has developed dizziness and shortness of breath.  Assessment & Plan:   Active Problems:   CVA (cerebral vascular accident) ([place])   AF (paroxysmal atrial fibrillation) (HCC)   COPD with chronic bronchitis (HCC)   Chronic respiratory failure with hypoxia (HCC)   COPD (chronic obstructive pulmonary disease) with acute bronchitis (HCC)   Acute on chronic respiratory failure (HCC)   Lung cancer (HCC)   COPD with acute exacerbation (HCC)   Pressure injury of skin   Acute on chronic hypoxic hypercapnic respiratory failure sec to COPD with acute exacerbation Patient presented with increased work of breathing, she was tachypneic,  She was requiring 5 L of supplemental oxygen to maintain saturation above 94%. Flu/covid neg.  Pt was put on BiPAP. Continue methylprednisone 60 iv q6, DuoNeb. Pulm consulted.  Recommended to continue Levaquin and Solu-Medrol Weaned off BiPAP and to 4L today. Start taperring from IV solumedrol to prednisone 50 mg daily Continue with scheduled DuoNeb Continue home breo, daliresp, incurse, singulair Continue supplemental O2 to keep sats between 88-92%, wean as  tolerated. She is weaned down to 3 L/min sats 94%.  Sepsis 2/2 pseudomonas PNA Patient was found to have tachycardia, tachypnea, leukocytosis.  No fever, neg procal.  Bronch lavage positive for pseudomonas She was started onzosynand levaquin on admission (discussed w/ Raul Del who advises double coverage for now) --zosyn d/c'ed. She has completed antibiotic treatment for possible pneumonia. Sepsis physiology has resolved.  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 Janese Banks to arrange chemo/RT --oupatient palliative care consult  Hypokalemia : Replacement in progress. Recheck Labs in am  Hypomagnesemia >>> resolved  History CVA Cont simvastatin  Parosyxmal a fib Here in sinus rhythm Continue home Eliquis.  Hypothyroidism - cont home synthroid  MDD - cont home lexapro  Anxiety --complicating respiratory status --Continue IV ativan 1mg  q6h PRN   DVT prophylaxis: Eliquis Code Status: Full code Family Communication: No family at bedside Disposition Plan:  Status is: Inpatient  Remains inpatient appropriate because:Inpatient level of care appropriate due to severity of illness   Dispo: The patient is from: Home              Anticipated d/c is to:  SNF/ Home with home health services              Anticipated d/c date is:  1 day              Patient currently is not medically stable to d/c.  Consultants:   Pulmonology  Procedures: None  Antimicrobials:  Anti-infectives (From admission, onward)   Start     Dose/Rate Route Frequency Ordered Stop   06/04/20 1300  levofloxacin (LEVAQUIN) tablet 750 mg  750 mg Oral Daily 06/04/20 1204 06/06/20 0924   06/03/20 0600  levofloxacin (LEVAQUIN) IVPB 750 mg  Status:  Discontinued        750 mg 100 mL/hr over 90 Minutes Intravenous Every 24 hours 06/02/20 1357 06/02/20 1529   06/03/20 0600  levofloxacin (LEVAQUIN) IVPB 750 mg  Status:  Discontinued        750 mg 100  mL/hr over 90 Minutes Intravenous Every 48 hours 06/02/20 1529 06/04/20 1203   06/02/20 0830  piperacillin-tazobactam (ZOSYN) IVPB 3.375 g  Status:  Discontinued        3.375 g 12.5 mL/hr over 240 Minutes Intravenous Every 8 hours 06/02/20 0823 06/04/20 1152   06/02/20 0515  cefTRIAXone (ROCEPHIN) 1 g in sodium chloride 0.9 % 100 mL IVPB        1 g 200 mL/hr over 30 Minutes Intravenous  Once 06/02/20 0510 06/02/20 0613   06/02/20 0515  azithromycin (ZITHROMAX) 500 mg in sodium chloride 0.9 % 250 mL IVPB        500 mg 250 mL/hr over 60 Minutes Intravenous  Once 06/02/20 0510 06/02/20 0800     Subjective: Patient was seen and examined at bedside.  Overnight events noted.   Discharge is held due to low blood pressure, nausea and critical potassium of 2.4.  She reports feeling better,  breathing has improved. She is on 3 L O2 saturation is 94%.  She reports nausea,  denies any vomiting.  Objective: Vitals:   06/10/20 0904 06/10/20 1659 06/11/20 0000 06/11/20 0756  BP: 114/73 (!) 83/58 91/67 (!) 92/56  Pulse: 79 87 83 74  Resp: 18 18 18 20   Temp: 97.7 F (36.5 C) 97.6 F (36.4 C) 98 F (36.7 C) (!) 97.5 F (36.4 C)  TempSrc: Oral Oral  Oral  SpO2: 96% 99% 99% 98%  Weight:      Height:        Intake/Output Summary (Last 24 hours) at 06/11/2020 1105 Last data filed at 06/11/2020 0530 Gross per 24 hour  Intake 483 ml  Output --  Net 483 ml   Filed Weights   06/05/20 0331 06/06/20 0500 06/07/20 0313  Weight: 43 kg 45.2 kg 45.3 kg    Examination:  General exam: Appears calm and comfortable, not in any acute distress. Respiratory system: Clear to auscultation. Respiratory effort normal. Cardiovascular system: S1 & S2 heard, RRR. No JVD, murmurs, rubs, gallops or clicks. No pedal edema. Gastrointestinal system: Abdomen is nondistended, soft and nontender. No organomegaly or masses felt. Normal bowel sounds heard. Central nervous system: Alert and oriented. No focal neurological  deficits. Extremities: Symmetric 5 x 5 power.  No edema, no cyanosis, no clubbing. Skin: No rashes, lesions or ulcers Psychiatry: Judgement and insight appear normal. Mood & affect appropriate.     Data Reviewed: I have personally reviewed following labs and imaging studies  CBC: Recent Labs  Lab 06/06/20 0625 06/07/20 0605 06/08/20 0441 06/09/20 0524 06/11/20 0537  WBC 10.7* 9.7 9.6 10.8* 15.1*  HGB 9.3* 9.6* 9.6* 10.3* 9.6*  HCT 29.5* 30.3* 29.8* 30.5* 31.2*  MCV 89.4 89.4 89.2 88.9 89.9  PLT 394 405* 405* 444* 672   Basic Metabolic Panel: Recent Labs  Lab 06/06/20 0625 06/07/20 0605 06/08/20 0441 06/09/20 0524 06/10/20 0458 06/11/20 0537  NA 141 138 138 138  --  138  K 3.9 3.3* 3.6 3.5  --  2.4*  CL 102 99 99 100  --  94*  CO2 31 28 32 29  --  34*  GLUCOSE 107* 111* 139* 123*  --  89  BUN 32* 33* 35* 35*  --  35*  CREATININE 0.89 0.73 0.92 0.92  --  0.76  CALCIUM 9.9 9.8 9.6 9.6  --  9.1  MG 2.0 2.0 1.9 1.9 1.9 1.9  PHOS  --  2.1* 1.9* 2.0*  --  3.4   GFR: Estimated Creatinine Clearance: 41.4 mL/min (by C-G formula based on SCr of 0.76 mg/dL). Liver Function Tests: Recent Labs  Lab 06/07/20 0605  AST 18  ALT 13  ALKPHOS 31*  BILITOT 0.4  PROT 6.5  ALBUMIN 3.0*   No results for input(s): LIPASE, AMYLASE in the last 168 hours. No results for input(s): AMMONIA in the last 168 hours. Coagulation Profile: No results for input(s): INR, PROTIME in the last 168 hours. Cardiac Enzymes: No results for input(s): CKTOTAL, CKMB, CKMBINDEX, TROPONINI in the last 168 hours. BNP (last 3 results) No results for input(s): PROBNP in the last 8760 hours. HbA1C: No results for input(s): HGBA1C in the last 72 hours. CBG: No results for input(s): GLUCAP in the last 168 hours. Lipid Profile: No results for input(s): CHOL, HDL, LDLCALC, TRIG, CHOLHDL, LDLDIRECT in the last 72 hours. Thyroid Function Tests: No results for input(s): TSH, T4TOTAL, FREET4, T3FREE,  THYROIDAB in the last 72 hours. Anemia Panel: No results for input(s): VITAMINB12, FOLATE, FERRITIN, TIBC, IRON, RETICCTPCT in the last 72 hours. Sepsis Labs: No results for input(s): PROCALCITON, LATICACIDVEN in the last 168 hours.  Recent Results (from the past 240 hour(s))  Blood culture (routine x 2)     Status: None   Collection Time: 06/02/20  4:51 AM   Specimen: BLOOD  Result Value Ref Range Status   Specimen Description BLOOD RIGHT ANTECUBITAL  Final   Special Requests   Final    BOTTLES DRAWN AEROBIC AND ANAEROBIC Blood Culture adequate volume   Culture   Final    NO GROWTH 5 DAYS Performed at [facility], [address] [place], [place] [address]    Report Status 06/07/2020 FINAL  Final  Blood culture (routine x 2)     Status: None   Collection Time: 06/02/20  4:51 AM   Specimen: BLOOD  Result Value Ref Range Status   Specimen Description BLOOD BLOOD RIGHT FOREARM  Final   Special Requests   Final    BOTTLES DRAWN AEROBIC AND ANAEROBIC Blood Culture results may not be optimal due to an excessive volume of blood received in culture bottles   Culture   Final    NO GROWTH 5 DAYS Performed at [facility], [address] [place], [place] [address]    Report Status 06/07/2020 FINAL  Final  Resp Panel by RT-PCR (Flu A&B, Covid) Nasopharyngeal Swab     Status: None   Collection Time: 06/02/20  4:51 AM   Specimen: Nasopharyngeal Swab; Nasopharyngeal(NP) swabs in vial transport medium  Result Value Ref Range Status   SARS Coronavirus 2 by RT PCR NEGATIVE NEGATIVE Final    Comment: (NOTE) SARS-CoV-2 target nucleic acids are NOT DETECTED.  The SARS-CoV-2 RNA is generally detectable in upper respiratory specimens during the acute phase of infection. The lowest concentration of SARS-CoV-2 viral copies this assay can detect is 138 copies/mL. A negative result does not preclude SARS-Cov-2 infection and should not be used as the sole basis for treatment  or other patient management decisions. A negative result may occur with  improper specimen collection/handling, submission of specimen other than nasopharyngeal  swab, presence of viral mutation(s) within the areas targeted by this assay, and inadequate number of viral copies(<138 copies/mL). A negative result must be combined with clinical observations, patient history, and epidemiological information. The expected result is Negative.  Fact Sheet for Patients:  EntrepreneurPulse.com.au  Fact Sheet for Healthcare Providers:  IncredibleEmployment.be  This test is no t yet approved or cleared by the [place] FDA and  has been authorized for detection and/or diagnosis of SARS-CoV-2 by FDA under an Emergency Use Authorization (EUA). This EUA will remain  in effect (meaning this test can be used) for the duration of the COVID-19 declaration under Section 564(b)(1) of the Act, 21 U.S.C.section 360bbb-3(b)(1), unless the authorization is terminated  or revoked sooner.       Influenza A by PCR NEGATIVE NEGATIVE Final   Influenza B by PCR NEGATIVE NEGATIVE Final    Comment: (NOTE) The Xpert Xpress SARS-CoV-2/FLU/RSV plus assay is intended as an aid in the diagnosis of influenza from Nasopharyngeal swab specimens and should not be used as a sole basis for treatment. Nasal washings and aspirates are unacceptable for Xpert Xpress SARS-CoV-2/FLU/RSV testing.  Fact Sheet for Patients: EntrepreneurPulse.com.au  Fact Sheet for Healthcare Providers: IncredibleEmployment.be  This test is not yet approved or cleared by the [place] FDA and has been authorized for detection and/or diagnosis of SARS-CoV-2 by FDA under an Emergency Use Authorization (EUA). This EUA will remain in effect (meaning this test can be used) for the duration of the COVID-19 declaration under Section 564(b)(1) of the Act, 21 U.S.C. section  360bbb-3(b)(1), unless the authorization is terminated or revoked.  Performed at [facility], [address] [place], [place] [address]      Radiology Studies: No results found.  Scheduled Meds: . apixaban  5 mg Oral BID  . escitalopram  20 mg Oral Daily  . fluticasone furoate-vilanterol  1 puff Inhalation Daily  . influenza vaccine adjuvanted  0.5 mL Intramuscular Tomorrow-1000  . levothyroxine  75 mcg Oral Q0600  . pantoprazole (PROTONIX) IV  40 mg Intravenous Q24H  . potassium chloride  40 mEq Oral Once  . potassium chloride  40 mEq Oral Once  . [START ON 06/12/2020] predniSONE  20 mg Oral Q breakfast   Followed by  . [START ON 06/14/2020] predniSONE  10 mg Oral Q breakfast  . promethazine  6.25 mg Intravenous BID  . scopolamine  1 patch Transdermal Q72H  . simvastatin  40 mg Oral QPM  . sodium chloride flush  3 mL Intravenous Q12H  . umeclidinium bromide  1 puff Inhalation Daily   Continuous Infusions: . sodium chloride 250 mL (06/04/20 0619)  . potassium chloride    . sodium chloride       LOS: 9 days    Time spent: 25 mins    Shawna Clamp, MD [facility]   If 7PM-7AM, please contact night-coverage

## 2020-06-11 NOTE — Care Management Important Message (Signed)
Important Message  Patient Details  Name: Christine Morris MRN: [id number] Date of Birth: Jun 05, 1940   Medicare Important Message Given:  Yes     Christine Morris 06/11/2020, 11:35 AM
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
## 2020-06-11 NOTE — Progress Notes (Signed)
CRITICAL VALUE ALERT  Critical Value:  Potassium: 2.4  Date & Time Notied:  06/11/20  Time: 10:01  Provider Notified: Dr. Dwyane Dee  Orders Received/Actions taken: New order: KCL 40 po once and KCL 10 IV x 4

## 2020-06-11 NOTE — TOC Progression Note (Signed)
Transition of Care [facility]) - Progression Note    Patient Details  Name: Christine Morris MRN: [id number] Date of Birth: 1941/05/14  Transition of Care [facility]) CM/SW [place], RN Phone Number: 06/11/2020, 3:11 PM  Clinical Narrative:   RNCM spoke with patient regarding continued recommendations for SNF at discharge. Patient reports that she does not want to go to SNF and still wants to go home. RNCM relayed this information to care team. RNCM reached back out to patient regarding possible need for a hospital bed, she is agreeable but requests this RNCM reach out to her husband. RNCM reached out to Mr. Newlin to discuss hospital bed which he reports he believes is a good idea and agrees he will be the main contact.  RNCM reached out to [facility] with [employer] for hospital bed.          Expected Discharge Plan and Services           Expected Discharge Date: 06/09/20                                     Social Determinants of Health (SDOH) Interventions    Readmission Risk Interventions No flowsheet data found.

## 2020-06-11 NOTE — Progress Notes (Signed)
PT Cancellation Note  Patient Details Name: BLINDA TUREK MRN: [id number] DOB: February 14, 1941   Cancelled Treatment:    Reason Eval/Treat Not Completed: Other (comment);Medical issues which prohibited therapy. Patient noted with critically low K+ (2.4) contraindicated for exertional activity at this time per PT practice guidelines.  Will continue efforts next date pending medical stability and appropriateness.  Lieutenant Diego PT, DPT 3:00 PM,06/11/20

## 2020-06-11 NOTE — Progress Notes (Signed)
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   OT comments  Christine Morris presents today ill-appearing, weak, and fatigued. She reports nausea, dizziness, overall "discomfort," and states that, as of this morning, she is having both double vision and blurred vision. She is Mod-Max A for bed mobility, as well as Max A to maintain sitting posture on EOB. Citing fatigue and nausea, she declines standing as well as grooming activities from EOB, instead engages in grooming tasks from supine, with frequent rest breaks. Pt also mentions that she no longer can taste her food, which this therapist communicated to MD, in case repeat COVID testing would be warranted (negative test performed on 06/02/20). During OT session several days earlier, Christine Morris stated that she was planning to D/C to a SNF, but today she and her husband report that Christine Morris would instead be going home -- that they had been told no SNF beds were available. Should Christine Morris go home, she would need HHOT and DME listed below. This therapist, however, would continue to recommend SNF. Palliative care and/or hospice services may well also be an appropriate option for this pt.     Follow Up Recommendations  SNF    Equipment Recommendations  3 in 1 bedside commode;Tub/shower seat;Other (comment)    Recommendations for Other Services Other  (comment) (palliative care/hospice?)    Precautions / Restrictions Precautions Precautions: Fall Restrictions Weight Bearing Restrictions: No       Mobility Bed Mobility Overal bed mobility: Needs Assistance Bed Mobility: Supine to Sit     Supine to sit: Max assist Sit to supine: Mod assist      Transfers                 General transfer comment: Pt unable to come into standing today    Balance Overall balance assessment: Needs assistance Sitting-balance support: Feet supported Sitting balance-Leahy Scale: Poor Sitting balance - Comments: Max A to maintain pt in seated position EOB Postural control: Posterior lean                                 ADL either performed or assessed with clinical judgement   ADL Overall ADL's : Needs assistance/impaired Eating/Feeding: Independent Eating/Feeding Details (indicate cue type and reason): Reports no appetite Grooming: Wash/dry face;Wash/dry hands;Brushing hair;Set up;Supervision/safety;Bed level Grooming Details (indicate cue type and reason): Required several rest breaks                                     Vision Patient Visual Report: Diplopia;Blurring of vision Additional Comments: Reports new onset of visual disturbance, as of this AM   Perception     Praxis      Cognition Arousal/Alertness: Lethargic Behavior During Therapy: WFL for tasks assessed/performed Overall Cognitive Status: Within Functional Limits for tasks assessed  General Comments: oriented to self, place, time        Exercises Other Exercises Other Exercises: Max A for bed mobility. Pt declines OOB mobility, 2/2 nausea, dizziness, fatigue.   Shoulder Instructions       General Comments      Pertinent Vitals/ Pain       Pain Assessment: 0-10 (Could not report any specific site of pain, but stated she felt discomfort "everywhere.") Pain Intervention(s):  Limited activity within patient's tolerance;Monitored during session;Relaxation;Repositioned  Home Living                                          Prior Functioning/Environment              Frequency  Min 1X/week        Progress Toward Goals  OT Goals(current goals can now be found in the care plan section)  Progress towards OT goals: Progressing toward goals  Acute Rehab OT Goals Patient Stated Goal: to go home OT Goal Formulation: With patient Time For Goal Achievement: 06/18/20 Potential to Achieve Goals: Good  Plan Discharge plan remains appropriate    Co-evaluation                 AM-PAC OT "6 Clicks" Daily Activity     Outcome Measure   Help from another person eating meals?: A Little Help from another person taking care of personal grooming?: A Little Help from another person toileting, which includes using toliet, bedpan, or urinal?: A Lot Help from another person bathing (including washing, rinsing, drying)?: A Lot Help from another person to put on and taking off regular upper body clothing?: A Lot Help from another person to put on and taking off regular lower body clothing?: A Lot 6 Click Score: 14    End of Session    OT Visit Diagnosis: Unsteadiness on feet (R26.81);Muscle weakness (generalized) (M62.81);Dizziness and giddiness (R42)   Activity Tolerance Patient limited by fatigue;Patient limited by lethargy;Other (comment) (pt limited by nausea, fatigue, and double vision)   Patient Left in bed;with call bell/phone within reach;with bed alarm set;with family/visitor present   Nurse Communication          Time: 1610-9604 OT Time Calculation (min): 38 min  Charges: OT General Charges $OT Visit: 1 Visit OT Treatments $Self Care/Home Management : 38-52 mins  Josiah Lobo, PhD, MS, OTR/L ascom [phone] 06/11/20, 1:59 PM

## 2020-06-12 DIAGNOSIS — J9621 Acute and chronic respiratory failure with hypoxia: Secondary | ICD-10-CM | POA: Diagnosis not present

## 2020-06-12 DIAGNOSIS — J9622 Acute and chronic respiratory failure with hypercapnia: Secondary | ICD-10-CM | POA: Diagnosis not present

## 2020-06-12 LAB — MAGNESIUM: Magnesium: 2 mg/dL (ref 1.7–2.4)

## 2020-06-12 LAB — BASIC METABOLIC PANEL
Anion gap: 8 (ref 5–15)
BUN: 21 mg/dL (ref 8–23)
CO2: 28 mmol/L (ref 22–32)
Calcium: 9.2 mg/dL (ref 8.9–10.3)
Chloride: 103 mmol/L (ref 98–111)
Creatinine, Ser: 0.6 mg/dL (ref 0.44–1.00)
GFR, Estimated: >60 mL/min (ref >60)
Glucose, Bld: 87 mg/dL (ref 70–99)
Potassium: 3.9 mmol/L (ref 3.5–5.1)
Sodium: 139 mmol/L (ref 135–145)

## 2020-06-12 LAB — CBC
HCT: 29.5 % — ABNORMAL LOW (ref 36.0–46.0)
Hemoglobin: 9.6 g/dL — ABNORMAL LOW (ref 12.0–15.0)
MCH: 28.8 pg (ref 26.0–34.0)
MCHC: 32.5 g/dL (ref 30.0–36.0)
MCV: 88.6 fL (ref 80.0–100.0)
Platelets: 337 10*3/uL (ref 150–400)
RBC: 3.33 MIL/uL — ABNORMAL LOW (ref 3.87–5.11)
RDW: 14.8 % (ref 11.5–15.5)
WBC: 13.7 10*3/uL — ABNORMAL HIGH (ref 4.0–10.5)
nRBC: 0 % (ref 0.0–0.2)

## 2020-06-12 NOTE — Progress Notes (Signed)
Physical Therapy Treatment Patient Details Name: Christine Morris MRN: [id number] DOB: [birth year] Today's Date: 06/12/2020    History of Present Illness Pt is a 80 y/o F with PMH: chronic hypoxic RF, advanced COPD (on 4-5L at baseline), bronchiectasis, Afib on Eliquis, and  known b/l small pleural effusions (s/p recent lung biopsy on Weds 1/5) who presents via EMS from home for assessment of acute onset of shortness of breath associate with increase in cough; admitted for management of acute/chronic respiratory failured due to COPD exacerbation.  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    PT Comments    Patient alert, agreeable to PT. Still planning on returning home. Pt educated about home safety; agreeable to attempt transfer to recliner (which would be similar to transferring to her wheelchair at home). Supine to sit with supervision, use of bed rails and extended time. Fair sitting balance noted with BUE support, extended sitting due to pt dizziness, spO2 WFLs on 4L via [place]. Sit <> stand from EOB and pivoted to chair with RW, minA ultimately due to buckling noted of LE. Pt reported that her legs felt very weak. Second sit <> stand from recliner and CGA, but pt unable to tolerate >10 seconds standing on second attempt. Pt in chair with all needs in reach in recliner at end of session. The patient would benefit from further skilled PT intervention to continue to progress towards goals. Recommendation remains appropriate.       Follow Up Recommendations  SNF;Supervision for mobility/OOB     Equipment Recommendations  3in1 (PT)    Recommendations for Other Services       Precautions / Restrictions Precautions Precautions: Fall Precaution Comments: watch O2 Restrictions Weight Bearing Restrictions: No    Mobility  Bed Mobility   Bed Mobility: Supine to Sit     Supine to sit: Supervision;HOB elevated     General bed mobility  comments: able to complete with supervision, extended time, use of bed rails  Transfers Overall transfer level: Needs assistance Equipment used: Rolling walker (2 wheeled) Transfers: Sit to/from [facility] Sit to Stand: Min assist Stand pivot transfers: Min assist       General transfer comment: cued for hand placement during transfers; ultimately minA due to leg weakness and buckling noted when coming into standing and pivoting to recliner  Ambulation/Gait             General Gait Details: deferred due to pt fatigue and SOB   Stairs             Wheelchair Mobility    Modified Rankin (Stroke Patients Only)       Balance Overall balance assessment: Needs assistance Sitting-balance support: Feet supported;Bilateral upper extremity supported Sitting balance-Leahy Scale: Fair Sitting balance - Comments: more comfortable with bilateral UE support   Standing balance support: Bilateral upper extremity supported;During functional activity Standing balance-Leahy Scale: Poor Standing balance comment: reliant on UE support and PT support                            Cognition Arousal/Alertness: Awake/alert Behavior During Therapy: WFL for tasks assessed/performed Overall Cognitive Status: Within Functional Limits for tasks assessed                                        Exercises Other Exercises Other Exercises:  sit <> stand from EOB, from recliner twice. pt needed cueing for anterior scoot on surface and cues for hand placement to maximize independence with transfers    General Comments General comments (skin integrity, edema, etc.): spO2 monitored intermittently; on 3L throughout. mild desaturation after standing, pt able to recover with rest      Pertinent Vitals/Pain Pain Assessment: No/denies pain    Home Living                      Prior Function            PT Goals (current goals can now be found  in the care plan section) Progress towards PT goals: Progressing toward goals    Frequency    Min 2X/week      PT Plan Current plan remains appropriate    Co-evaluation              AM-PAC PT "6 Clicks" Mobility   Outcome Measure  Help needed turning from your back to your side while in a flat bed without using bedrails?: A Little Help needed moving from lying on your back to sitting on the side of a flat bed without using bedrails?: A Little Help needed moving to and from a bed to a chair (including a wheelchair)?: A Little Help needed standing up from a chair using your arms (e.g., wheelchair or bedside chair)?: A Little Help needed to walk in hospital room?: A Lot Help needed climbing 3-5 steps with a railing? : Total 6 Click Score: 15    End of Session Equipment Utilized During Treatment: Oxygen (4L) Activity Tolerance: Patient tolerated treatment well Patient left: in chair;with chair alarm set;with call bell/phone within reach;with family/visitor present Nurse Communication: Mobility status PT Visit Diagnosis: Muscle weakness (generalized) (M62.81);Difficulty in walking, not elsewhere classified (R26.2);Unsteadiness on feet (R26.81);Other abnormalities of gait and mobility (R26.89)     Time: 7096-2836 PT Time Calculation (min) (ACUTE ONLY): 26 min  Charges:  $Therapeutic Exercise: 23-37 mins                     Lieutenant Diego PT, DPT 11:18 AM,06/12/20

## 2020-06-12 NOTE — Progress Notes (Signed)
Patient discharged per orders. PIV removed. AVS reviewed with patient/family. Taken to medical mall via wheelchair by nursing staff.
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
## 2020-06-12 NOTE — Progress Notes (Signed)
PROGRESS NOTE    Christine Morris  XHB:[id number] DOB: Mar 21, 1941 DOA: 06/02/2020 PCP: Chase Picket, MD   Brief Narrative:  This 80 years old female with PMH significant for severe COPD on home oxygen 4 L, paroxysmal A. fib on DOAC, hypothyroidism, recent diagnosis of small cell lung cancer who presents with worsening shortness of breath.  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  Patient is admitted for acute on chronic hypoxic and hypercapnic respiratory failure secondary to COPD exacerbation.  She has improved and she is at her baseline oxygen requirement.  She has completed antibiotic treatment for possible pneumonia.  Patient was discharged yesterday but has developed dizziness and shortness of breath.  Patient seems much better.  she reports improved nausea and vomiting and patient is being discharged home with home services.  Assessment & Plan:   Active Problems:   CVA (cerebral vascular accident) ([place])   AF (paroxysmal atrial fibrillation) (HCC)   COPD with chronic bronchitis (HCC)   Chronic respiratory failure with hypoxia (HCC)   COPD (chronic obstructive pulmonary disease) with acute bronchitis (HCC)   Acute on chronic respiratory failure (HCC)   Lung cancer (HCC)   COPD with acute exacerbation (HCC)   Pressure injury of skin   Acute on chronic hypoxic hypercapnic respiratory failure sec to COPD with acute exacerbation Patient presented with increased work of breathing, she was tachypneic,  She was requiring 5 L of supplemental oxygen to maintain saturation above 94%. Flu/covid neg.  Pt was put on BiPAP. Continue methylprednisone 60 iv q6, DuoNeb. Pulm consulted.  Recommended to continue Levaquin and Solu-Medrol Weaned off BiPAP and to 4L today. Start taperring from IV solumedrol to prednisone 50 mg daily Continue with scheduled  DuoNeb Continue home breo, daliresp, incurse, singulair Continue supplemental O2 to keep sats between 88-92%, wean as tolerated. She is weaned down to 3 L/min sats 94%.   Sepsis 2/2 pseudomonas PNA Patient was found to have tachycardia, tachypnea, leukocytosis.  No fever, neg procal.  Bronch lavage positive for pseudomonas She was started onzosyn and levaquin on admission (discussed w/ Raul Del who advises double coverage for now) --zosyn d/c'ed. She has completed antibiotic treatment for possible pneumonia. Sepsis physiology has resolved.   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 Janese Banks to arrange chemo/RT --oupatient palliative care consult   Hypokalemia : Replacement in progress. Recheck Labs in am  Hypomagnesemia >>> resolved  History CVA Cont simvastatin   Parosyxmal a fib Here in sinus rhythm Continue home Eliquis.   Hypothyroidism - cont home synthroid   MDD - cont home lexapro   Anxiety --complicating respiratory status --Continue IV ativan 1mg  q6h PRN    DVT prophylaxis: Eliquis Code Status: Full code Family Communication: No family at bedside Disposition Plan:  Status is: Inpatient  Remains inpatient appropriate because:Inpatient level of care appropriate due to severity of illness   Dispo: The patient is from: Home              Anticipated d/c is to:  SNF/ Home with home health services              Anticipated d/c date is: Patient is being discharged today              Patient currently is not medically stable to d/c.  Consultants:  Pulmonology  Procedures: None  Antimicrobials:  Anti-infectives (From admission, onward)    Start     Dose/Rate Route Frequency Ordered Stop   06/04/20 1300  levofloxacin (LEVAQUIN) tablet 750 mg        750 mg Oral Daily 06/04/20 1204 06/06/20 0924   06/03/20 0600  levofloxacin (LEVAQUIN) IVPB 750 mg  Status:  Discontinued        750 mg 100 mL/hr over 90 Minutes  Intravenous Every 24 hours 06/02/20 1357 06/02/20 1529   06/03/20 0600  levofloxacin (LEVAQUIN) IVPB 750 mg  Status:  Discontinued        750 mg 100 mL/hr over 90 Minutes Intravenous Every 48 hours 06/02/20 1529 06/04/20 1203   06/02/20 0830  piperacillin-tazobactam (ZOSYN) IVPB 3.375 g  Status:  Discontinued        3.375 g 12.5 mL/hr over 240 Minutes Intravenous Every 8 hours 06/02/20 0823 06/04/20 1152   06/02/20 0515  cefTRIAXone (ROCEPHIN) 1 g in sodium chloride 0.9 % 100 mL IVPB        1 g 200 mL/hr over 30 Minutes Intravenous  Once 06/02/20 0510 06/02/20 0613   06/02/20 0515  azithromycin (ZITHROMAX) 500 mg in sodium chloride 0.9 % 250 mL IVPB        500 mg 250 mL/hr over 60 Minutes Intravenous  Once 06/02/20 0510 06/02/20 0800      Subjective: Patient was seen and examined at bedside.  Overnight events noted.   Patient reports improved nausea and vomiting.  Electrolytes normal.  Patient is being discharged today.   Objective: Vitals:   06/11/20 0756 06/11/20 1624 06/12/20 0008 06/12/20 0818  BP: (!) 92/56 110/68 118/71 121/66  Pulse: 74 83 78 69  Resp: 20 (!) 22 16 16   Temp: (!) 97.5 F (36.4 C) 98 F (36.7 C) 98 F (36.7 C) 97.8 F (36.6 C)  TempSrc: Oral Oral    SpO2: 98% 100% 97% 97%  Weight:      Height:        Intake/Output Summary (Last 24 hours) at 06/12/2020 1104 Last data filed at 06/12/2020 1017 Gross per 24 hour  Intake 1692.7 ml  Output 300 ml  Net 1392.7 ml   Filed Weights   06/05/20 0331 06/06/20 0500 06/07/20 0313  Weight: 43 kg 45.2 kg 45.3 kg    Examination:  General exam: Appears calm and comfortable, not in any acute distress. Respiratory system: Clear to auscultation. Respiratory effort normal. Cardiovascular system: S1 & S2 heard, RRR. No JVD, murmurs, rubs, gallops or clicks. No pedal edema. Gastrointestinal system: Abdomen is nondistended, soft and nontender. No organomegaly or masses felt. Normal bowel sounds heard. Central nervous  system: Alert and oriented. No focal neurological deficits. Extremities: Symmetric 5 x 5 power.  No edema, no cyanosis, no clubbing. Skin: No rashes, lesions or ulcers Psychiatry: Judgement and insight appear normal. Mood & affect appropriate.     Data Reviewed: I have personally reviewed following labs and imaging studies  CBC: Recent Labs  Lab 06/07/20 0605 06/08/20 0441 06/09/20 0524 06/11/20 0537 06/12/20 0539  WBC 9.7 9.6 10.8* 15.1* 13.7*  HGB 9.6* 9.6* 10.3* 9.6* 9.6*  HCT 30.3* 29.8* 30.5* 31.2* 29.5*  MCV 89.4 89.2 88.9 89.9 88.6  PLT 405* 405* 444* 375 245   Basic Metabolic Panel: Recent Labs  Lab 06/07/20 0605 06/08/20 0441 06/09/20 0524 06/10/20 0458 06/11/20 0537 06/12/20 0539  NA 138 138 138  --  138 139  K 3.3* 3.6 3.5  --  2.4* 3.9  CL  99 99 100  --  94* 103  CO2 28 32 29  --  34* 28  GLUCOSE 111* 139* 123*  --  89 87  BUN 33* 35* 35*  --  35* 21  CREATININE 0.73 0.92 0.92  --  0.76 0.60  CALCIUM 9.8 9.6 9.6  --  9.1 9.2  MG 2.0 1.9 1.9 1.9 1.9 2.0  PHOS 2.1* 1.9* 2.0*  --  3.4  --    GFR: Estimated Creatinine Clearance: 41.4 mL/min (by C-G formula based on SCr of 0.6 mg/dL). Liver Function Tests: Recent Labs  Lab 06/07/20 0605  AST 18  ALT 13  ALKPHOS 31*  BILITOT 0.4  PROT 6.5  ALBUMIN 3.0*   No results for input(s): LIPASE, AMYLASE in the last 168 hours. No results for input(s): AMMONIA in the last 168 hours. Coagulation Profile: No results for input(s): INR, PROTIME in the last 168 hours. Cardiac Enzymes: No results for input(s): CKTOTAL, CKMB, CKMBINDEX, TROPONINI in the last 168 hours. BNP (last 3 results) No results for input(s): PROBNP in the last 8760 hours. HbA1C: No results for input(s): HGBA1C in the last 72 hours. CBG: No results for input(s): GLUCAP in the last 168 hours. Lipid Profile: No results for input(s): CHOL, HDL, LDLCALC, TRIG, CHOLHDL, LDLDIRECT in the last 72 hours. Thyroid Function Tests: No results for  input(s): TSH, T4TOTAL, FREET4, T3FREE, THYROIDAB in the last 72 hours. Anemia Panel: No results for input(s): VITAMINB12, FOLATE, FERRITIN, TIBC, IRON, RETICCTPCT in the last 72 hours. Sepsis Labs: No results for input(s): PROCALCITON, LATICACIDVEN in the last 168 hours.  No results found for this or any previous visit (from the past 240 hour(s)).   Radiology Studies: No results found.  Scheduled Meds:  apixaban  5 mg Oral BID   escitalopram  20 mg Oral Daily   fluticasone furoate-vilanterol  1 puff Inhalation Daily   influenza vaccine adjuvanted  0.5 mL Intramuscular Tomorrow-1000   levothyroxine  75 mcg Oral Q0600   pantoprazole (PROTONIX) IV  40 mg Intravenous Q24H   predniSONE  20 mg Oral Q breakfast   Followed by   Derrill Memo ON 06/14/2020] predniSONE  10 mg Oral Q breakfast   promethazine  6.25 mg Intravenous BID   scopolamine  1 patch Transdermal Q72H   sodium chloride flush  3 mL Intravenous Q12H   umeclidinium bromide  1 puff Inhalation Daily   Continuous Infusions:  sodium chloride 250 mL (06/04/20 0619)     LOS: 10 days    Time spent: 25 mins    Anastasia Tompson, MD [facility]   If 7PM-7AM, please contact night-coverage

## 2020-06-12 NOTE — Plan of Care (Signed)

## 2020-06-13 ENCOUNTER — Encounter: Payer: Self-pay | Admitting: *Deleted

## 2020-06-13 DIAGNOSIS — C3490 Malignant neoplasm of unspecified part of unspecified bronchus or lung: Secondary | ICD-10-CM

## 2020-06-13 LAB — CULTURE, RESPIRATORY W GRAM STAIN

## 2020-06-13 NOTE — Progress Notes (Signed)
  Oncology Nurse Navigator Documentation  Navigator Location: [facility] (06/13/20 0900)   )Navigator Encounter Type: Telephone (06/13/20 0900) Telephone: Appt Confirmation/Clarification;Outgoing Call (06/13/20 0900)                       Barriers/Navigation Needs: Coordination of Care (06/13/20 0900)   Interventions: Coordination of Care (06/13/20 0900)   Coordination of Care: Appts;Radiology (06/13/20 0900)           Pt discharged from hospital yesterday. Per Dr. Janese Banks, pt needs outpatient PET scan and follow up at the [place] [facility] to be scheduled. Will assist in coordinating appts and notify pt once appts scheduled.   Spoke with patient and reviewed upcoming appts. Pt stated that she still feels very weak at this time after discharge. Confirmed appts. Instructed pt to call with any further questions or needs. Pt verbalized understanding.       Time Spent with Patient: 30 (06/13/20 0900)

## 2020-06-26 ENCOUNTER — Telehealth: Payer: Self-pay | Admitting: *Deleted

## 2020-06-26 ENCOUNTER — Inpatient Hospital Stay
Admission: EM | Admit: 2020-06-26 | Discharge: 2020-07-04 | DRG: 186 | Disposition: A | Discharge status: Hospice | Payer: Medicare Other | Attending: Internal Medicine | Admitting: Internal Medicine

## 2020-06-26 ENCOUNTER — Other Ambulatory Visit: Payer: Self-pay

## 2020-06-26 ENCOUNTER — Encounter: Admission: RE | Admit: 2020-06-26 | Payer: Medicare Other | Source: Ambulatory Visit

## 2020-06-26 ENCOUNTER — Encounter: Payer: Self-pay | Admitting: Emergency Medicine

## 2020-06-26 ENCOUNTER — Emergency Department: Payer: Medicare Other

## 2020-06-26 DIAGNOSIS — Z87891 Personal history of nicotine dependence: Secondary | ICD-10-CM

## 2020-06-26 DIAGNOSIS — Z8673 Personal history of transient ischemic attack (TIA), and cerebral infarction without residual deficits: Secondary | ICD-10-CM

## 2020-06-26 DIAGNOSIS — Z79890 Hormone replacement therapy (postmenopausal): Secondary | ICD-10-CM

## 2020-06-26 DIAGNOSIS — C781 Secondary malignant neoplasm of mediastinum: Secondary | ICD-10-CM | POA: Diagnosis present

## 2020-06-26 DIAGNOSIS — J90 Pleural effusion, not elsewhere classified: Principal | ICD-10-CM | POA: Diagnosis present

## 2020-06-26 DIAGNOSIS — J9621 Acute and chronic respiratory failure with hypoxia: Secondary | ICD-10-CM | POA: Diagnosis not present

## 2020-06-26 DIAGNOSIS — F419 Anxiety disorder, unspecified: Secondary | ICD-10-CM | POA: Diagnosis present

## 2020-06-26 DIAGNOSIS — R0602 Shortness of breath: Secondary | ICD-10-CM

## 2020-06-26 DIAGNOSIS — J9622 Acute and chronic respiratory failure with hypercapnia: Secondary | ICD-10-CM

## 2020-06-26 DIAGNOSIS — D630 Anemia in neoplastic disease: Secondary | ICD-10-CM | POA: Diagnosis present

## 2020-06-26 DIAGNOSIS — Z7901 Long term (current) use of anticoagulants: Secondary | ICD-10-CM

## 2020-06-26 DIAGNOSIS — Z7951 Long term (current) use of inhaled steroids: Secondary | ICD-10-CM

## 2020-06-26 DIAGNOSIS — F32A Depression, unspecified: Secondary | ICD-10-CM | POA: Diagnosis present

## 2020-06-26 DIAGNOSIS — Z515 Encounter for palliative care: Secondary | ICD-10-CM

## 2020-06-26 DIAGNOSIS — I4891 Unspecified atrial fibrillation: Secondary | ICD-10-CM | POA: Diagnosis present

## 2020-06-26 DIAGNOSIS — R64 Cachexia: Secondary | ICD-10-CM | POA: Diagnosis present

## 2020-06-26 DIAGNOSIS — I472 Ventricular tachycardia: Secondary | ICD-10-CM

## 2020-06-26 DIAGNOSIS — J209 Acute bronchitis, unspecified: Secondary | ICD-10-CM | POA: Diagnosis present

## 2020-06-26 DIAGNOSIS — Z7189 Other specified counseling: Secondary | ICD-10-CM

## 2020-06-26 DIAGNOSIS — K922 Gastrointestinal hemorrhage, unspecified: Secondary | ICD-10-CM

## 2020-06-26 DIAGNOSIS — J441 Chronic obstructive pulmonary disease with (acute) exacerbation: Secondary | ICD-10-CM | POA: Diagnosis not present

## 2020-06-26 DIAGNOSIS — Z79899 Other long term (current) drug therapy: Secondary | ICD-10-CM

## 2020-06-26 DIAGNOSIS — C3411 Malignant neoplasm of upper lobe, right bronchus or lung: Secondary | ICD-10-CM | POA: Diagnosis present

## 2020-06-26 DIAGNOSIS — E876 Hypokalemia: Secondary | ICD-10-CM | POA: Diagnosis present

## 2020-06-26 DIAGNOSIS — C3490 Malignant neoplasm of unspecified part of unspecified bronchus or lung: Secondary | ICD-10-CM

## 2020-06-26 DIAGNOSIS — Z888 Allergy status to other drugs, medicaments and biological substances status: Secondary | ICD-10-CM

## 2020-06-26 DIAGNOSIS — E43 Unspecified severe protein-calorie malnutrition: Secondary | ICD-10-CM | POA: Insufficient documentation

## 2020-06-26 DIAGNOSIS — Z9981 Dependence on supplemental oxygen: Secondary | ICD-10-CM

## 2020-06-26 DIAGNOSIS — Z20822 Contact with and (suspected) exposure to covid-19: Secondary | ICD-10-CM | POA: Diagnosis present

## 2020-06-26 DIAGNOSIS — J440 Chronic obstructive pulmonary disease with acute lower respiratory infection: Secondary | ICD-10-CM | POA: Diagnosis not present

## 2020-06-26 DIAGNOSIS — Z681 Body mass index (BMI) 19 or less, adult: Secondary | ICD-10-CM

## 2020-06-26 DIAGNOSIS — Z66 Do not resuscitate: Secondary | ICD-10-CM | POA: Diagnosis present

## 2020-06-26 DIAGNOSIS — E039 Hypothyroidism, unspecified: Secondary | ICD-10-CM | POA: Diagnosis present

## 2020-06-26 DIAGNOSIS — D509 Iron deficiency anemia, unspecified: Secondary | ICD-10-CM | POA: Diagnosis present

## 2020-06-26 DIAGNOSIS — J9620 Acute and chronic respiratory failure, unspecified whether with hypoxia or hypercapnia: Secondary | ICD-10-CM | POA: Diagnosis present

## 2020-06-26 DIAGNOSIS — Z9889 Other specified postprocedural states: Secondary | ICD-10-CM

## 2020-06-26 DIAGNOSIS — E785 Hyperlipidemia, unspecified: Secondary | ICD-10-CM | POA: Diagnosis present

## 2020-06-26 LAB — COMPREHENSIVE METABOLIC PANEL
ALT: 8 U/L (ref 0–44)
AST: 20 U/L (ref 15–41)
Albumin: 2.4 g/dL — ABNORMAL LOW (ref 3.5–5.0)
Alkaline Phosphatase: 32 U/L — ABNORMAL LOW (ref 38–126)
Anion gap: 12 (ref 5–15)
BUN: 19 mg/dL (ref 8–23)
CO2: 29 mmol/L (ref 22–32)
Calcium: 9.5 mg/dL (ref 8.9–10.3)
Chloride: 99 mmol/L (ref 98–111)
Creatinine, Ser: 0.55 mg/dL (ref 0.44–1.00)
GFR, Estimated: >60 mL/min (ref >60)
Glucose, Bld: 135 mg/dL — ABNORMAL HIGH (ref 70–99)
Potassium: 3.3 mmol/L — ABNORMAL LOW (ref 3.5–5.1)
Sodium: 140 mmol/L (ref 135–145)
Total Bilirubin: 0.5 mg/dL (ref 0.3–1.2)
Total Protein: 6.1 g/dL — ABNORMAL LOW (ref 6.5–8.1)

## 2020-06-26 LAB — BLOOD GAS, VENOUS
Acid-Base Excess: 4.4 mmol/L — ABNORMAL HIGH (ref 0.0–2.0)
Bicarbonate: 31.1 mmol/L — ABNORMAL HIGH (ref 20.0–28.0)
FIO2: 1
O2 Saturation: 92.2 %
Patient temperature: 37
pCO2, Ven: 59 mmHg (ref 44.0–60.0)
pH, Ven: 7.33 (ref 7.250–7.430)
pO2, Ven: 69 mmHg — ABNORMAL HIGH (ref 32.0–45.0)

## 2020-06-26 LAB — TYPE AND SCREEN

## 2020-06-26 LAB — PREPARE RBC (CROSSMATCH)

## 2020-06-26 LAB — CBC
HCT: 26.1 % — ABNORMAL LOW (ref 36.0–46.0)
Hemoglobin: 7.7 g/dL — ABNORMAL LOW (ref 12.0–15.0)
MCH: 27.8 pg (ref 26.0–34.0)
MCHC: 29.5 g/dL — ABNORMAL LOW (ref 30.0–36.0)
MCV: 94.2 fL (ref 80.0–100.0)
Platelets: 443 10*3/uL — ABNORMAL HIGH (ref 150–400)
RBC: 2.77 MIL/uL — ABNORMAL LOW (ref 3.87–5.11)
RDW: 15.7 % — ABNORMAL HIGH (ref 11.5–15.5)
WBC: 15.7 10*3/uL — ABNORMAL HIGH (ref 4.0–10.5)
nRBC: 0 % (ref 0.0–0.2)

## 2020-06-26 LAB — TROPONIN I (HIGH SENSITIVITY)
Troponin I (High Sensitivity): 11 ng/L (ref <18)
Troponin I (High Sensitivity): 9 ng/L (ref <18)

## 2020-06-26 LAB — ABO/RH: ABO/RH(D): O POS

## 2020-06-26 LAB — SARS CORONAVIRUS 2 BY RT PCR (HOSPITAL ORDER, PERFORMED IN ~~LOC~~ HOSPITAL LAB): SARS Coronavirus 2: NEGATIVE

## 2020-06-26 MED ORDER — UMECLIDINIUM BROMIDE 62.5 MCG/INH IN AEPB: 1.0000 | INHALATION_SPRAY | Freq: Every day | RESPIRATORY_TRACT | Status: DC

## 2020-06-26 MED ORDER — LEVOTHYROXINE SODIUM 50 MCG PO TABS
75.0000 ug | ORAL_TABLET | Freq: Every day | ORAL | Status: DC
  Administered 2020-06-28 – 2020-07-04 (×7): 75 ug via ORAL
  Filled 2020-06-26: qty 1
  Filled 2020-06-26: qty 2
  Filled 2020-06-26 (×3): qty 1
  Filled 2020-06-26: qty 2
  Filled 2020-06-26 (×2): qty 1

## 2020-06-26 MED ORDER — MEGESTROL ACETATE 625 MG/5ML PO SUSP: 625.0000 mg | Freq: Every day | ORAL | Status: DC

## 2020-06-26 MED ORDER — METOPROLOL TARTRATE 5 MG/5ML IV SOLN: 5.0000 mg | INTRAVENOUS | Status: DC | PRN

## 2020-06-26 MED ORDER — ACETAMINOPHEN 325 MG PO TABS: 325.0000 mg | ORAL_TABLET | Freq: Four times a day (QID) | ORAL | Status: AC | PRN

## 2020-06-26 MED ORDER — METOPROLOL TARTRATE 5 MG/5ML IV SOLN: 5.0000 mg | Freq: Once | INTRAVENOUS | Status: DC

## 2020-06-26 MED ORDER — PROCHLORPERAZINE MALEATE 10 MG PO TABS: 10.0000 mg | ORAL_TABLET | Freq: Four times a day (QID) | ORAL | 0 refills | Status: DC | PRN

## 2020-06-26 MED ORDER — IPRATROPIUM-ALBUTEROL 0.5-2.5 (3) MG/3ML IN SOLN
3.0000 mL | Freq: Four times a day (QID) | RESPIRATORY_TRACT | Status: AC
  Administered 2020-06-26 – 2020-06-29 (×12): 3 mL via RESPIRATORY_TRACT
  Filled 2020-06-26 (×12): qty 3

## 2020-06-26 MED ORDER — ACETAMINOPHEN 325 MG RE SUPP: 325.0000 mg | Freq: Four times a day (QID) | RECTAL | Status: DC | PRN

## 2020-06-26 MED ORDER — METOPROLOL TARTRATE 5 MG/5ML IV SOLN
5.0000 mg | INTRAVENOUS | Status: AC
  Administered 2020-06-26: 5 mg via INTRAVENOUS
  Filled 2020-06-26: qty 5

## 2020-06-26 MED ORDER — ONDANSETRON HCL 4 MG PO TABS
4.0000 mg | ORAL_TABLET | Freq: Four times a day (QID) | ORAL | Status: AC | PRN
Start: 2020-06-26 — End: 2020-07-01

## 2020-06-26 MED ORDER — ONDANSETRON HCL 4 MG/2ML IJ SOLN
4.0000 mg | Freq: Four times a day (QID) | INTRAMUSCULAR | Status: AC | PRN
  Administered 2020-06-28 – 2020-07-01 (×3): 4 mg via INTRAVENOUS
  Filled 2020-06-26 (×3): qty 2

## 2020-06-26 MED ORDER — SODIUM CHLORIDE 0.9 % IV SOLN
80.0000 mg | Freq: Once | INTRAVENOUS | Status: AC
  Administered 2020-06-26: 80 mg via INTRAVENOUS
  Filled 2020-06-26: qty 80

## 2020-06-26 MED ORDER — IPRATROPIUM-ALBUTEROL 0.5-2.5 (3) MG/3ML IN SOLN
3.0000 mL | Freq: Once | RESPIRATORY_TRACT | Status: AC
  Administered 2020-06-26: 3 mL via RESPIRATORY_TRACT
  Filled 2020-06-26: qty 6

## 2020-06-26 MED ORDER — FENTANYL CITRATE (PF) 100 MCG/2ML IJ SOLN
12.5000 ug | INTRAMUSCULAR | Status: AC | PRN
  Administered 2020-06-27 (×2): 12.5 ug via INTRAVENOUS
  Filled 2020-06-26 (×2): qty 2

## 2020-06-26 MED ORDER — IPRATROPIUM-ALBUTEROL 0.5-2.5 (3) MG/3ML IN SOLN
3.0000 mL | Freq: Once | RESPIRATORY_TRACT | Status: AC
  Administered 2020-06-26: 3 mL via RESPIRATORY_TRACT

## 2020-06-26 MED ORDER — ROFLUMILAST 500 MCG PO TABS
250.0000 ug | ORAL_TABLET | Freq: Every day | ORAL | Status: DC
  Administered 2020-06-27 – 2020-07-04 (×8): 250 ug via ORAL
  Filled 2020-06-26 (×8): qty 1

## 2020-06-26 MED ORDER — SODIUM CHLORIDE 0.9 % IV SOLN
8.0000 mg/h | INTRAVENOUS | Status: DC
  Administered 2020-06-26 – 2020-06-29 (×5): 8 mg/h via INTRAVENOUS
  Filled 2020-06-26 (×6): qty 80

## 2020-06-26 MED ORDER — ONDANSETRON HCL 4 MG PO TABS: 4.0000 mg | ORAL_TABLET | Freq: Four times a day (QID) | ORAL | Status: DC | PRN

## 2020-06-26 MED ORDER — ESCITALOPRAM OXALATE 10 MG PO TABS
20.0000 mg | ORAL_TABLET | Freq: Every day | ORAL | Status: DC
Start: 2020-06-27 — End: 2020-07-04
  Administered 2020-06-27 – 2020-07-04 (×8): 20 mg via ORAL
  Filled 2020-06-26 (×8): qty 2

## 2020-06-26 MED ORDER — METOPROLOL TARTRATE 5 MG/5ML IV SOLN
5.0000 mg | INTRAVENOUS | Status: AC | PRN
  Administered 2020-07-01 – 2020-07-03 (×2): 5 mg via INTRAVENOUS
  Filled 2020-06-26 (×2): qty 5

## 2020-06-26 MED ORDER — ACETAMINOPHEN 325 MG PO TABS
325.0000 mg | ORAL_TABLET | Freq: Four times a day (QID) | ORAL | Status: DC | PRN
Start: 2020-06-26 — End: 2020-06-26

## 2020-06-26 MED ORDER — MONTELUKAST SODIUM 10 MG PO TABS
10.0000 mg | ORAL_TABLET | ORAL | Status: DC
  Administered 2020-06-28 – 2020-07-04 (×7): 10 mg via ORAL
  Filled 2020-06-26 (×8): qty 1

## 2020-06-26 MED ORDER — ONDANSETRON HCL 4 MG/2ML IJ SOLN: 4.0000 mg | Freq: Four times a day (QID) | INTRAMUSCULAR | Status: DC | PRN

## 2020-06-26 MED ORDER — SODIUM CHLORIDE 0.9 % IV SOLN
10.0000 mL/h | Freq: Once | INTRAVENOUS | Status: AC
  Administered 2020-06-26: 10 mL/h via INTRAVENOUS

## 2020-06-26 MED ORDER — ACETAMINOPHEN 650 MG RE SUPP: 325.0000 mg | Freq: Four times a day (QID) | RECTAL | Status: AC | PRN

## 2020-06-26 MED ORDER — FLUTICASONE FUROATE-VILANTEROL 200-25 MCG/INH IN AEPB
1.0000 | INHALATION_SPRAY | Freq: Every day | RESPIRATORY_TRACT | Status: DC
  Filled 2020-06-26: qty 28

## 2020-06-26 NOTE — ED Notes (Signed)
Lab at bed side for type and screen

## 2020-06-26 NOTE — ED Notes (Signed)
Took over care of pt. See blood admin charting. Pt in NAD at this time. Will continue to monitor.

## 2020-06-26 NOTE — ED Notes (Signed)
Spoke with pharmacy- they will be sending Protonix soon.

## 2020-06-26 NOTE — Telephone Encounter (Addendum)
Pt did not show for her PET scan today due to not feeling well. Pt understands that her PET scan will need to be rescheduled and her treatment may be delayed. Please advise on if pt should keep appt for follow up as scheduled on Thurs 2/3 or if it needs to be rescheduled to after her PET scan?

## 2020-06-26 NOTE — Addendum Note (Signed)
Addended by: Telford Nab on: 06/26/2020 01:24 PM   Modules accepted: Orders

## 2020-06-26 NOTE — Telephone Encounter (Signed)
Rx has been sent into her pharmacy. Importance of the PET scan was emphasized to the patient. She stated that she will try to go today. Education provided regarding the need for the scan to plan her treatment and that delaying the scan will delay treatment and impact her prognosis. Pt verbalized understanding.

## 2020-06-26 NOTE — ED Notes (Signed)
Message sent to pharmacy regarding getting patient's Protonix.

## 2020-06-26 NOTE — H&P (Signed)
History and Physical   Christine Morris FGH:[id number] DOB: April 23, 1941 DOA: 06/26/2020  PCP: Chase Picket, MD   Outpatient Specialists: Dr. Luvenia Heller, pulmonology Patient coming from: home  I have personally briefly reviewed patient's old medical records in [place].  Chief Concern: shortness of breath   HPI: Christine Morris is a 80 y.o. female with medical history significant for advanced COPD with chronic hypoxia on 3 to 4 L home oxygen continuously at baseline, atrial fibrillation on Eliquis, small cell carcinoma new diagnosis, atypical granulomatosis infection with MAC, COPD cachexia, presents to the emergency department for chief concerns of worsening shortness of breath.  She presented with chief concern of shortness of breath that has been worsening for the last 3 days.  She denies fever, nausea, vomiting, chest pain, abdominal pain, dysuria, hematuria.  She endorses baseline cough.  She endorses diarrhea that has been dark brown.  Social history: She lives with her spouse.  She quit smoking about 20 years ago.  She denies alcohol and recreational drug use.  ROS: Constitutional: + weight change, no fever ENT/Mouth: no sore throat, no rhinorrhea Eyes: no eye pain, no vision changes Cardiovascular: no chest pain, + dyspnea,  no edema, no palpitations Respiratory: n+ cough, no sputum, no wheezing Gastrointestinal: no nausea, no vomiting, no diarrhea, no constipation Genitourinary: no urinary incontinence, no dysuria, no hematuria Musculoskeletal: no arthralgias, no myalgias Skin: no skin lesions, no pruritus, Neuro: + weakness, no loss of consciousness, no syncope Psych: no anxiety, no depression, + decrease appetite Heme/Lymph: no bruising, no bleeding  ED Course: Discussed with ED provider, patient requiring hospitalization due to worsening shortness of breath requiring increased oxygenation.  Assessment/Plan  Principal Problem:   Acute on chronic respiratory  failure (HCC) Active Problems:   COPD (chronic obstructive pulmonary disease) with acute bronchitis (HCC)   Anxiety and depression   Lung cancer (HCC)   COPD with acute exacerbation (HCC)   Pleural effusion on left   Acute on chronic respiratory failure-suspect secondary to left pleural effusion worsening versus COPD exacerbation -Left lower lobe thoracentesis ordered with labs  COPD COPD cachexia -DuoNebs every 6 hours scheduled while awake -Resumed home COPD medications: Roflumilast to 50 mcg daily, montelukast 5 mg daily, Incruse Ellipta 1 puff daily, Breo Ellipta 1 puff daily -Continue oxygen supplementation to maintain SPO2 greater than 88%  Right pleural effusion-query carcinoma versus heart failure exacerbation  -Labs ordered for pleurocentesis  Right upper lobe lung cancer-outpatient follow-up  Anemia with positive fecal occult-GI has been consulted via secure chat, Dr. Sherri Sear  Atrial fibrillation with RVR and nonsustained V. tach-improved with metoprolol 5 mg IVP every 2 hours as needed for heart rate greater than 120, 2 more doses ordered  Chart reviewed.   DVT prophylaxis: TED hose Code Status: Full code Diet: N.p.o. Family Communication: Updated spouse at bedside Disposition Plan: Pending clinical course however as patient is has multiple respiratory diseases, poor prognosis Consults called: GI and IR Admission status: Observation to progressive cardiac telemetry  Past Medical History:  Diagnosis Date  . Afib ([place])   . Anxiety   . Atrial fibrillation ([place]) 2017  . Cervical stenosis of spine   . COPD (chronic obstructive pulmonary disease) ([place])   . Depression   . Hyperlipidemia   . Hypothyroidism   . Nodule of right lung   . On supplemental oxygen by nasal cannula   . 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 Rupture of extensor tendon of finger   .  Spondylosis of cervical region without myelopathy or radiculopathy   . Stroke ([place])   . Vertigo   .  Wartenberg syndrome    Past Surgical History:  Procedure Laterality Date  . ABDOMINAL HYSTERECTOMY    . APPENDECTOMY    . BREAST BIOPSY Left 1971  . BREAST CYST EXCISION Left 1971  . CATARACT SURGERY Bilateral   . LOOP RECORDER IMPLANT    . OOPHORECTOMY Bilateral   . VIDEO BRONCHOSCOPY WITH ENDOBRONCHIAL NAVIGATION N/A 05/30/2020   Procedure: VIDEO BRONCHOSCOPY WITH ENDOBRONCHIAL NAVIGATION;  Surgeon: Ottie Glazier, MD;  Location: [facility] ORS;  Service: Thoracic;  Laterality: N/A;  . VIDEO BRONCHOSCOPY WITH ENDOBRONCHIAL ULTRASOUND N/A 05/30/2020   Procedure: VIDEO BRONCHOSCOPY WITH ENDOBRONCHIAL ULTRASOUND;  Surgeon: Ottie Glazier, MD;  Location: [facility] ORS;  Service: Thoracic;  Laterality: N/A;   Social History:  reports that she has quit smoking. She has never used smokeless tobacco. She reports that she does not drink alcohol and does not use drugs.  Allergies  Allergen Reactions  . Rivaroxaban Hives   No family history on file. Family history: Family history reviewed and not pertinent  Prior to Admission medications   Medication Sig Start Date End Date Taking? Authorizing Provider  acetaminophen (TYLENOL) 500 MG tablet Take 1,000 mg by mouth every 6 (six) hours as needed for pain.    [provider]  apixaban (ELIQUIS) 5 MG TABS tablet Take 1 tablet (5 mg total) by mouth 2 (two) times daily. 06/09/20   Shawna Clamp, MD  BREO ELLIPTA 200-25 MCG/INH AEPB Inhale 1 puff into the lungs daily.    [provider]  calcitRIOL (ROCALTROL) 0.5 MCG capsule Take 0.5 mcg by mouth daily.    [provider]  DALIRESP 250 MCG TABS Take 250 mcg by mouth daily.  11/17/19   [provider]  escitalopram (LEXAPRO) 20 MG tablet Take 20 mg by mouth daily.    [provider]  fenofibrate 160 MG tablet Take 160 mg by mouth daily.    [provider]  ferrous gluconate (FERGON) 324 MG tablet Take 324 mg by mouth daily with breakfast.     [provider]  INCRUSE ELLIPTA 62.5 MCG/INH AEPB Inhale 1 puff into the lungs daily.    [provider]  levothyroxine (SYNTHROID) 75 MCG tablet Take 75 mcg by mouth daily before breakfast. 10/18/19   [provider]  megestrol (MEGACE ES) 625 MG/5ML suspension Take 5 mLs by mouth daily. 03/31/20   [provider]  montelukast (SINGULAIR) 10 MG tablet Take 10 mg by mouth every morning.    [provider]  potassium chloride (KLOR-CON) 10 MEQ tablet Take 10 mEq by mouth daily. 10/21/19   [provider]  predniSONE (DELTASONE) 10 MG tablet Take 3 tablets (30 mg total) by mouth daily with breakfast. Advised to take prednisone 30 mg daily for 3 days then 20 mg daily for 3 days then 10 mg daily for 3 days and stop 06/10/20   Shawna Clamp, MD  prochlorperazine (COMPAZINE) 10 MG tablet Take 1 tablet (10 mg total) by mouth every 6 (six) hours as needed for nausea or vomiting. 06/26/20   Sindy Guadeloupe, MD  scopolamine (TRANSDERM-SCOP) 1 MG/3DAYS Place 1 patch (1.5 mg total) onto the skin every 3 (three) days. 06/10/20   Shawna Clamp, MD  vitamin B-12 (CYANOCOBALAMIN) 1000 MCG tablet Take 1,000 mcg by mouth daily.    [provider]   Physical Exam: Vitals:   06/26/20 2000  06/26/20 2030 06/26/20 2100 06/26/20 2130  BP: 115/83 (!) 109/97 102/89 110/80  Pulse: (!) 38 98 91 91  Resp: 18 (!) 32 (!) 27 (!) 23  Temp:      TempSrc:      SpO2: 96% 98% 100% 91%  Weight:      Height:       Constitutional: appears old, cachectic, frail, increased work of breathing Eyes: PERRL, lids and conjunctivae normal ENMT: Mucous membranes are moist. Posterior pharynx clear of any exudate or lesions. Age-appropriate dentition. Hearing appropriate Neck: normal, supple, no masses, no thyromegaly Respiratory: clear to auscultation bilaterally, no wheezing, no crackles. Normal respiratory effort. No accessory muscle use.  Cardiovascular: Regular rate and rhythm, no  murmurs / rubs / gallops. No extremity edema. 2+ pedal pulses. No carotid bruits.  Abdomen: no tenderness, no masses palpated, no hepatosplenomegaly. Bowel sounds positive.  Musculoskeletal: no clubbing / cyanosis. No joint deformity upper and lower extremities. Good ROM, no contractures, no atrophy. Normal muscle tone.  Skin: no rashes, lesions, ulcers. No induration Neurologic: Sensation intact. Strength 5/5 in all 4.  Psychiatric: Normal judgment and insight. Alert and oriented x 3. Normal mood.   EKG: independently reviewed, showing atrial fibrillation with rvr with rate of 181, qtc 441  Chest x-ray on Admission: I personally reviewed and I agree with radiologist reading as below.  DG Chest Portable 1 View  Result Date: 06/26/2020 CLINICAL DATA:  Shortness of breath EXAM: PORTABLE CHEST 1 VIEW COMPARISON:  06/05/2020 FINDINGS: Increased left pleural effusion and left lung atelectasis. Background changes COPD. No pneumothorax. Stable cardiomediastinal contours. IMPRESSION: Increased left pleural effusion and left lung atelectasis. Electronically Signed   By: Macy Mis M.D.   On: 06/26/2020 16:55   Labs on Admission: I have personally reviewed following labs  CBC: Recent Labs  Lab 06/26/20 1634  WBC 15.7*  HGB 7.7*  HCT 26.1*  MCV 94.2  PLT 944*   Basic Metabolic Panel: Recent Labs  Lab 06/26/20 1634  NA 140  K 3.3*  CL 99  CO2 29  GLUCOSE 135*  BUN 19  CREATININE 0.55  CALCIUM 9.5   GFR: Estimated Creatinine Clearance: 41.5 mL/min (by C-G formula based on SCr of 0.55 mg/dL).  Liver Function Tests: Recent Labs  Lab 06/26/20 1634  AST 20  ALT 8  ALKPHOS 32*  BILITOT 0.5  PROT 6.1*  ALBUMIN 2.4*   Urine analysis:    Component Value Date/Time   COLORURINE YELLOW (A) 10/11/2017 0223   APPEARANCEUR HAZY (A) 10/11/2017 0223   APPEARANCEUR Clear 09/30/2012 0829   LABSPEC 1.013 10/11/2017 0223   LABSPEC 1.012 09/30/2012 0829   PHURINE 6.0 10/11/2017 0223    GLUCOSEU NEGATIVE 10/11/2017 0223   GLUCOSEU 50 mg/dL 09/30/2012 0829   HGBUR SMALL (A) 10/11/2017 0223   BILIRUBINUR NEGATIVE 10/11/2017 0223   BILIRUBINUR Negative 09/30/2012 0829   KETONESUR NEGATIVE 10/11/2017 0223   PROTEINUR NEGATIVE 10/11/2017 0223   NITRITE POSITIVE (A) 10/11/2017 0223   LEUKOCYTESUR SMALL (A) 10/11/2017 0223   LEUKOCYTESUR Negative 09/30/2012 0829   CRITICAL CARE Performed by: Briant Cedar Jennah Satchell  Total critical care time: 40 minutes  Critical care time was exclusive of separately billable procedures and treating other patients.  Critical care was necessary to treat or prevent imminent or life-threatening deterioration. Acute hypoxic respiratory failure and anemia.  Critical care was time spent personally by me on the following activities: development of treatment plan with patient and/or surrogate as well as nursing, discussions with  consultants, evaluation of patient's response to treatment, examination of patient, obtaining history from patient or surrogate, ordering and performing treatments and interventions, ordering and review of laboratory studies, ordering and review of radiographic studies, pulse oximetry and re-evaluation of patient's condition.  Eyva Califano N Lamount Bankson D.O. [facility]  If 7PM-7AM, please contact overnight-coverage provider If 7AM-7PM, please contact day coverage provider [email or web address]  06/26/2020, 10:16 PM

## 2020-06-26 NOTE — ED Notes (Signed)
Blood consent done on paper due to pin pad not working.

## 2020-06-26 NOTE — ED Provider Notes (Signed)
[facility] Emergency Department Provider Note  Time seen: 4:29 PM  I have reviewed the triage vital signs and the nursing notes.   HISTORY  Chief Complaint Respiratory Distress   HPI Christine Morris is a 80 y.o. female with a past medical history of atrial fibrillation, COPD, hyperlipidemia, presents to the emergency department for shortness of breath.  According to the patient she woke up feeling normal however this afternoon became feeling short of breath over the past 2 to 3 hours.  EMS states upon arrival patient was significant tachypnea with mild respiratory distress sitting upright satting around 90%.  They attempted CPAP in route but patient did not tolerate, received 125 mg of Solu-Medrol, DuoNebs prior to arrival.  On arrival patient currently on a nonrebreather mask satting in the mid 90s.  Patient continues to have tachypnea with mild respiratory distress but is able to answer in 2-3 word sentences.  Denies any chest pain.  Denies any recent fever or increased cough.  No leg pain or swelling  Past Medical History:  Diagnosis Date  . Afib ([place])   . Anxiety   . Atrial fibrillation ([place]) 2017  . Cervical stenosis of spine   . COPD (chronic obstructive pulmonary disease) ([place])   . Depression   . Hyperlipidemia   . Hypothyroidism   . Nodule of right lung   . On supplemental oxygen by nasal cannula   . 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 Rupture of extensor tendon of finger   . Spondylosis of cervical region without myelopathy or radiculopathy   . Stroke ([place])   . Vertigo   . Wartenberg syndrome     Patient Active Problem List   Diagnosis Date Noted  . Pressure injury of skin 06/09/2020  . COPD (chronic obstructive pulmonary disease) with acute bronchitis ([place]) 06/02/2020  . Acute on chronic respiratory failure ([place]) 06/02/2020  . Lung cancer ([place]) 06/02/2020  . COPD with acute exacerbation ([place]) 06/02/2020  . Syncope 12/16/2019  . AF  (paroxysmal atrial fibrillation) ([place]) 12/16/2019  . COPD with chronic bronchitis ([place]) 12/16/2019  . Chronic respiratory failure with hypoxia ([place]) 12/16/2019  . Sepsis ([place]) 05/31/2018  . TIA (transient ischemic attack) 10/11/2017  . CVA (cerebral vascular accident) ([place]) 10/11/2017  . Anxiety and depression 05/08/2015    Past Surgical History:  Procedure Laterality Date  . ABDOMINAL HYSTERECTOMY    . APPENDECTOMY    . BREAST BIOPSY Left 1971  . BREAST CYST EXCISION Left 1971  . CATARACT SURGERY Bilateral   . LOOP RECORDER IMPLANT    . OOPHORECTOMY Bilateral   . VIDEO BRONCHOSCOPY WITH ENDOBRONCHIAL NAVIGATION N/A 05/30/2020   Procedure: VIDEO BRONCHOSCOPY WITH ENDOBRONCHIAL NAVIGATION;  Surgeon: Ottie Glazier, MD;  Location: [facility] ORS;  Service: Thoracic;  Laterality: N/A;  . VIDEO BRONCHOSCOPY WITH ENDOBRONCHIAL ULTRASOUND N/A 05/30/2020   Procedure: VIDEO BRONCHOSCOPY WITH ENDOBRONCHIAL ULTRASOUND;  Surgeon: Ottie Glazier, MD;  Location: [facility] ORS;  Service: Thoracic;  Laterality: N/A;    Prior to Admission medications   Medication Sig Start Date End Date Taking? Authorizing Provider  acetaminophen (TYLENOL) 500 MG tablet Take 1,000 mg by mouth every 6 (six) hours as needed for pain.    [provider]  apixaban (ELIQUIS) 5 MG TABS tablet Take 1 tablet (5 mg total) by mouth 2 (two) times daily. 06/09/20   Shawna Clamp, MD  BREO ELLIPTA 200-25 MCG/INH AEPB Inhale 1 puff into the lungs daily.    [provider]  calcitRIOL (ROCALTROL) 0.5 MCG capsule Take 0.5 mcg by mouth daily.    [provider]  DALIRESP 250 MCG TABS Take 250 mcg by mouth daily.  11/17/19   [provider]  escitalopram (LEXAPRO) 20 MG tablet Take 20 mg by mouth daily.    [provider]  fenofibrate 160 MG tablet Take 160 mg by mouth daily.    [provider]  ferrous gluconate (FERGON) 324 MG tablet Take 324 mg by mouth daily with breakfast.     [provider]  INCRUSE ELLIPTA 62.5 MCG/INH AEPB Inhale 1 puff into the lungs daily.    [provider]  levothyroxine (SYNTHROID) 75 MCG tablet Take 75 mcg by mouth daily before breakfast. 10/18/19   [provider]  megestrol (MEGACE ES) 625 MG/5ML suspension Take 5 mLs by mouth daily. 03/31/20   [provider]  montelukast (SINGULAIR) 10 MG tablet Take 10 mg by mouth every morning.    [provider]  potassium chloride (KLOR-CON) 10 MEQ tablet Take 10 mEq by mouth daily. 10/21/19   [provider]  predniSONE (DELTASONE) 10 MG tablet Take 3 tablets (30 mg total) by mouth daily with breakfast. Advised to take prednisone 30 mg daily for 3 days then 20 mg daily for 3 days then 10 mg daily for 3 days and stop 06/10/20   Shawna Clamp, MD  prochlorperazine (COMPAZINE) 10 MG tablet Take 1 tablet (10 mg total) by mouth every 6 (six) hours as needed for nausea or vomiting. 06/26/20   Sindy Guadeloupe, MD  scopolamine (TRANSDERM-SCOP) 1 MG/3DAYS Place 1 patch (1.5 mg total) onto the skin every 3 (three) days. 06/10/20   Shawna Clamp, MD  vitamin B-12 (CYANOCOBALAMIN) 1000 MCG tablet Take 1,000 mcg by mouth daily.    [provider]    Allergies  Allergen Reactions  . Rivaroxaban Hives    No family history on file.  Social History Social History   Tobacco Use  . Smoking status: Former Research scientist (life sciences)  . Smokeless tobacco: Never Used  Vaping Use  . Vaping Use: Never used  Substance Use Topics  . Alcohol use: Never  . Drug use: Never    Review of Systems Constitutional: Negative for fever. Cardiovascular: Negative for chest pain. Respiratory: Negative for shortness of breath. Gastrointestinal: Negative for abdominal pain Musculoskeletal: Negative for musculoskeletal complaints Neurological: Negative for headache All other ROS negative  ____________________________________________   PHYSICAL EXAM:  VITAL SIGNS: ED Triage Vitals  Enc  Vitals Group     BP --      Pulse --      Resp --      Temp --      Temp src --      SpO2 06/26/20 1623 99 %     Weight 06/26/20 1627 100 lb (45.4 kg)     Height 06/26/20 1627 5\' 3"  (1.6 m)     Head Circumference --      Peak Flow --      Pain Score 06/26/20 1627 0     Pain Loc --      Pain Edu? --      Excl. in [place]? --    Constitutional: Alert and oriented.  Sitting upright in bed with moderate tachypnea, mild respiratory distress. Eyes: Normal exam ENT      Head: Normocephalic and atraumatic.      Mouth/Throat: Somewhat dry appearing mucous membranes. Cardiovascular: Normal rate, regular rhythm. Respiratory: Tachypnea with increased  respiratory effort.  Appears to have rhonchi left-sided greater than right-sided.  No obvious wheeze but diminished breath sounds bilaterally. Gastrointestinal: Soft and nontender. No distention.  Musculoskeletal: Nontender with normal range of motion in all extremities.  Without edema. Neurologic:  Normal speech and language. No gross focal neurologic deficits are appreciated. Skin:  Skin is warm, dry and intact.  Psychiatric: Mood and affect are normal for situation.  ____________________________________________    EKG  EKG viewed and interpreted by myself shows atrial fibrillation with a rapid ventricular response of 181 bpm, narrow QRS, rightward axis.  Nonspecific ST changes.  Occasional PVCs noted.  Significant electrical interference due to breathing.  Patient currently with a rhythm consistent with atrial fibrillation on cardiac monitor with a rate around 120 bpm ____________________________________________    RADIOLOGY  Chest x-ray shows increased left pleural effusion.  ____________________________________________   INITIAL IMPRESSION / ASSESSMENT AND PLAN / ED COURSE  Pertinent labs & imaging results that were available during my care of the patient were reviewed by me and considered in my medical decision making (see chart for  details).   Patient presents to the emergency department via EMS for respiratory distress.  Patient is tachypneic with mild respiratory distress sitting upright.  Able to speak in 2-3 word sentences.  Has diminished breath sounds bilaterally.  We will dose 2 additional duo nebs.  Patient has received 125 Solu-Medrol prior to arrival as well as duo nebs.  Patient currently on a nonrebreather mask will attempt to titrate.  Patient states she could not tolerate CPAP and does not want to try BiPAP.  States she has never required intubation in the past.  Patient's labs have resulted showing a low hemoglobin 7.7.  Rectal examination shows dark stool strongly guaiac positive.  We will start the patient on Protonix bolus and infusion.  Patient does take Eliquis.  Given the patient's dark stool with a hemoglobin of 7.7 down from her baseline closer to 10 we will transfuse 1 unit of blood products as well.  Patient has been transitioned to 6 L nasal cannula and continues to do well.  I believe the patient is likely experiencing shortness of breath primarily due to COPD but exacerbated by symptomatic anemia as well as a pleural effusion.  We will start the patient on Protonix bolus and infusion and admit to the hospital service for further work-up and treatment.  Christine Morris was evaluated in Emergency Department on 06/26/2020 for the symptoms described in the history of present illness. She was evaluated in the context of the global COVID-19 pandemic, which necessitated consideration that the patient might be at risk for infection with the SARS-CoV-2 virus that causes COVID-19. Institutional protocols and algorithms that pertain to the evaluation of patients at risk for COVID-19 are in a state of rapid change based on information released by regulatory bodies including the CDC and federal and state organizations. These policies and algorithms were followed during the patient's care in the ED.  CRITICAL  CARE Performed by: Harvest Dark   Total critical care time: 30 minutes  Critical care time was exclusive of separately billable procedures and treating other patients.  Critical care was necessary to treat or prevent imminent or life-threatening deterioration.  Critical care was time spent personally by me on the following activities: development of treatment plan with patient and/or surrogate as well as nursing, discussions with consultants, evaluation of patient's response to treatment, examination of patient, obtaining history from patient or surrogate, ordering and performing  treatments and interventions, ordering and review of laboratory studies, ordering and review of radiographic studies, pulse oximetry and re-evaluation of patient's condition.  ____________________________________________   FINAL CLINICAL IMPRESSION(S) / ED DIAGNOSES  COPD exacerbation GI bleed   Harvest Dark, MD 06/26/20 [phone]

## 2020-06-26 NOTE — Telephone Encounter (Signed)
Per Dr. Janese Banks - pt needs to keep appt on Thursday as scheduled. Refer to Dr. Baruch Gouty as well and check labs that day with cbc, cmp. Orders placed. Reviewed upcoming appts with pt. Pt verbalized understanding.

## 2020-06-26 NOTE — ED Triage Notes (Signed)
Patient arrives via EMS for breathing difficulties. Patient was 92% on room air initially with EMS then dropped into the 80's. EMS tried CPAP but patient couldn't tolerate it. Patient currently on non-rebreather and AOx4.

## 2020-06-26 NOTE — Telephone Encounter (Signed)
Compazine 10 mg q6 prn. Can you please ask her to not cancel pet today.its important to get all the information

## 2020-06-26 NOTE — Telephone Encounter (Signed)
Pt called in to request prescription to help with nausea. Pt states that she is not feeling well due to nausea and feeling dizzy when she gets up. She has been eating and drinking well to stay hydrated. States that she will not make it in today for her PET scan and is going to cancel.   Can pt get something to help with her nausea?   Pt has follow up on Thursday - would you like for her to keep that appt or reschedule to after PET?

## 2020-06-27 ENCOUNTER — Observation Stay: Payer: Medicare Other

## 2020-06-27 ENCOUNTER — Other Ambulatory Visit: Payer: Self-pay

## 2020-06-27 ENCOUNTER — Encounter: Payer: Self-pay | Admitting: Internal Medicine

## 2020-06-27 DIAGNOSIS — Z7901 Long term (current) use of anticoagulants: Secondary | ICD-10-CM | POA: Diagnosis not present

## 2020-06-27 DIAGNOSIS — E039 Hypothyroidism, unspecified: Secondary | ICD-10-CM | POA: Diagnosis present

## 2020-06-27 DIAGNOSIS — F419 Anxiety disorder, unspecified: Secondary | ICD-10-CM

## 2020-06-27 DIAGNOSIS — I5021 Acute systolic (congestive) heart failure: Secondary | ICD-10-CM | POA: Diagnosis not present

## 2020-06-27 DIAGNOSIS — I472 Ventricular tachycardia: Secondary | ICD-10-CM | POA: Diagnosis present

## 2020-06-27 DIAGNOSIS — J90 Pleural effusion, not elsewhere classified: Secondary | ICD-10-CM | POA: Diagnosis present

## 2020-06-27 DIAGNOSIS — J440 Chronic obstructive pulmonary disease with acute lower respiratory infection: Secondary | ICD-10-CM | POA: Diagnosis present

## 2020-06-27 DIAGNOSIS — R64 Cachexia: Secondary | ICD-10-CM | POA: Diagnosis present

## 2020-06-27 DIAGNOSIS — R0602 Shortness of breath: Secondary | ICD-10-CM | POA: Diagnosis present

## 2020-06-27 DIAGNOSIS — D649 Anemia, unspecified: Secondary | ICD-10-CM | POA: Diagnosis not present

## 2020-06-27 DIAGNOSIS — J9621 Acute and chronic respiratory failure with hypoxia: Secondary | ICD-10-CM | POA: Diagnosis present

## 2020-06-27 DIAGNOSIS — Z20822 Contact with and (suspected) exposure to covid-19: Secondary | ICD-10-CM | POA: Diagnosis present

## 2020-06-27 DIAGNOSIS — Z79899 Other long term (current) drug therapy: Secondary | ICD-10-CM | POA: Diagnosis not present

## 2020-06-27 DIAGNOSIS — C781 Secondary malignant neoplasm of mediastinum: Secondary | ICD-10-CM | POA: Diagnosis present

## 2020-06-27 DIAGNOSIS — I4891 Unspecified atrial fibrillation: Secondary | ICD-10-CM | POA: Diagnosis present

## 2020-06-27 DIAGNOSIS — Z8673 Personal history of transient ischemic attack (TIA), and cerebral infarction without residual deficits: Secondary | ICD-10-CM | POA: Diagnosis not present

## 2020-06-27 DIAGNOSIS — E43 Unspecified severe protein-calorie malnutrition: Secondary | ICD-10-CM | POA: Diagnosis present

## 2020-06-27 DIAGNOSIS — J441 Chronic obstructive pulmonary disease with (acute) exacerbation: Secondary | ICD-10-CM | POA: Diagnosis present

## 2020-06-27 DIAGNOSIS — F32A Depression, unspecified: Secondary | ICD-10-CM | POA: Diagnosis present

## 2020-06-27 DIAGNOSIS — K922 Gastrointestinal hemorrhage, unspecified: Secondary | ICD-10-CM | POA: Diagnosis present

## 2020-06-27 DIAGNOSIS — Z7951 Long term (current) use of inhaled steroids: Secondary | ICD-10-CM | POA: Diagnosis not present

## 2020-06-27 DIAGNOSIS — Z515 Encounter for palliative care: Secondary | ICD-10-CM

## 2020-06-27 DIAGNOSIS — C3492 Malignant neoplasm of unspecified part of left bronchus or lung: Secondary | ICD-10-CM | POA: Diagnosis not present

## 2020-06-27 DIAGNOSIS — Z681 Body mass index (BMI) 19 or less, adult: Secondary | ICD-10-CM | POA: Diagnosis not present

## 2020-06-27 DIAGNOSIS — J209 Acute bronchitis, unspecified: Secondary | ICD-10-CM | POA: Diagnosis present

## 2020-06-27 DIAGNOSIS — C3411 Malignant neoplasm of upper lobe, right bronchus or lung: Secondary | ICD-10-CM | POA: Diagnosis present

## 2020-06-27 DIAGNOSIS — Z9981 Dependence on supplemental oxygen: Secondary | ICD-10-CM | POA: Diagnosis not present

## 2020-06-27 DIAGNOSIS — Z66 Do not resuscitate: Secondary | ICD-10-CM | POA: Diagnosis present

## 2020-06-27 LAB — PROTEIN, PLEURAL OR PERITONEAL FLUID: Total protein, fluid: 3.2 g/dL

## 2020-06-27 LAB — CBC
HCT: 26.8 % — ABNORMAL LOW (ref 36.0–46.0)
Hemoglobin: 8.2 g/dL — ABNORMAL LOW (ref 12.0–15.0)
MCH: 28.3 pg (ref 26.0–34.0)
MCHC: 30.6 g/dL (ref 30.0–36.0)
MCV: 92.4 fL (ref 80.0–100.0)
Platelets: 379 10*3/uL (ref 150–400)
RBC: 2.9 MIL/uL — ABNORMAL LOW (ref 3.87–5.11)
RDW: 15.9 % — ABNORMAL HIGH (ref 11.5–15.5)
WBC: 12.5 10*3/uL — ABNORMAL HIGH (ref 4.0–10.5)
nRBC: 0 % (ref 0.0–0.2)

## 2020-06-27 LAB — AMYLASE, PLEURAL OR PERITONEAL FLUID: Amylase, Fluid: 55 U/L

## 2020-06-27 LAB — ALBUMIN, PLEURAL OR PERITONEAL FLUID: Albumin, Fluid: 1.6 g/dL

## 2020-06-27 LAB — BPAM RBC
Blood Product Expiration Date: 202203032359
ISSUE DATE / TIME: 202202012315
Unit Type and Rh: 5100

## 2020-06-27 LAB — VITAMIN B12: Vitamin B-12: 1855 pg/mL — ABNORMAL HIGH (ref 180–914)

## 2020-06-27 LAB — BASIC METABOLIC PANEL
Anion gap: 12 (ref 5–15)
BUN: 25 mg/dL — ABNORMAL HIGH (ref 8–23)
CO2: 30 mmol/L (ref 22–32)
Calcium: 9.4 mg/dL (ref 8.9–10.3)
Chloride: 101 mmol/L (ref 98–111)
Creatinine, Ser: 0.82 mg/dL (ref 0.44–1.00)
GFR, Estimated: >60 mL/min (ref >60)
Glucose, Bld: 157 mg/dL — ABNORMAL HIGH (ref 70–99)
Potassium: 3.9 mmol/L (ref 3.5–5.1)
Sodium: 143 mmol/L (ref 135–145)

## 2020-06-27 LAB — BODY FLUID CELL COUNT WITH DIFFERENTIAL
Eos, Fluid: 0 %
Lymphs, Fluid: 1 %
Monocyte-Macrophage-Serous Fluid: 11 %
Neutrophil Count, Fluid: 88 %
Total Nucleated Cell Count, Fluid: 2776 cu mm

## 2020-06-27 LAB — TYPE AND SCREEN
ABO/RH(D): O POS
Antibody Screen: NEGATIVE
Unit division: 0

## 2020-06-27 LAB — FERRITIN: Ferritin: 75 ng/mL (ref 11–307)

## 2020-06-27 LAB — MAGNESIUM: Magnesium: 1.6 mg/dL — ABNORMAL LOW (ref 1.7–2.4)

## 2020-06-27 LAB — GLUCOSE, PLEURAL OR PERITONEAL FLUID: Glucose, Fluid: 118 mg/dL

## 2020-06-27 LAB — IRON AND TIBC
Iron: 11 ug/dL — ABNORMAL LOW (ref 28–170)
Saturation Ratios: 4 % — ABNORMAL LOW (ref 10.4–31.8)
TIBC: 263 ug/dL (ref 250–450)
UIBC: 252 ug/dL

## 2020-06-27 LAB — LACTATE DEHYDROGENASE, PLEURAL OR PERITONEAL FLUID: LD, Fluid: 245 U/L — ABNORMAL HIGH (ref 3–23)

## 2020-06-27 LAB — FOLATE: Folate: 6.7 ng/mL (ref >5.9)

## 2020-06-27 MED ORDER — METHYLPREDNISOLONE SODIUM SUCC 40 MG IJ SOLR
40.0000 mg | Freq: Two times a day (BID) | INTRAMUSCULAR | Status: DC
  Administered 2020-06-27 – 2020-06-28 (×2): 40 mg via INTRAVENOUS
  Filled 2020-06-27 (×2): qty 1

## 2020-06-27 MED ORDER — FLUTICASONE FUROATE-VILANTEROL 200-25 MCG/INH IN AEPB
1.0000 | INHALATION_SPRAY | Freq: Every day | RESPIRATORY_TRACT | Status: DC
  Administered 2020-06-28 – 2020-07-04 (×7): 1 via RESPIRATORY_TRACT
  Filled 2020-06-27: qty 28

## 2020-06-27 MED ORDER — FUROSEMIDE 10 MG/ML IJ SOLN: 40.0000 mg | Freq: Once | INTRAMUSCULAR | Status: AC

## 2020-06-27 MED ORDER — FUROSEMIDE 10 MG/ML IJ SOLN
INTRAMUSCULAR | Status: AC
  Administered 2020-06-27: 40 mg via INTRAVENOUS
  Filled 2020-06-27: qty 4

## 2020-06-27 MED ORDER — MAGNESIUM SULFATE 2 GM/50ML IV SOLN
2.0000 g | Freq: Once | INTRAVENOUS | Status: AC
  Administered 2020-06-27: 2 g via INTRAVENOUS
  Filled 2020-06-27: qty 50

## 2020-06-27 MED ORDER — UMECLIDINIUM BROMIDE 62.5 MCG/INH IN AEPB
1.0000 | INHALATION_SPRAY | Freq: Every day | RESPIRATORY_TRACT | Status: DC
  Administered 2020-06-28 – 2020-07-04 (×7): 1 via RESPIRATORY_TRACT
  Filled 2020-06-27 (×2): qty 7

## 2020-06-27 MED ORDER — MEGESTROL ACETATE 400 MG/10ML PO SUSP
625.0000 mg | Freq: Every day | ORAL | Status: DC
  Administered 2020-06-27 – 2020-07-01 (×5): 625 mg via ORAL
  Filled 2020-06-27 (×5): qty 20

## 2020-06-27 NOTE — ED Notes (Signed)
Randol Kern, NP and RT at bedside starting [facility].

## 2020-06-27 NOTE — Consult Note (Signed)
Christine Morris , MD [address] [place], [place], [place], [address] [address] [place], [place], [place], [address] Phone: [phone]  Fax: [phone]  Consultation  Referring Provider:   Dr. Tobie Poet  primary Care Physician:  Chase Picket, MD Primary Gastroenterologist:  Dr. Emelda Fear    Reason for Consultation:     Stool occult positive  Date of Admission:  06/26/2020 Date of Consultation:  06/27/2020         HPI:   Christine Morris is a 80 y.o. female with a history of iron deficiency anemia and was evaluated at [facility] back in 2017 when she underwent a colonoscopy at that time.  And 6 polyps were removed.  1 or more polyps were greater than 1 cm.  She was admitted with shortness of breath last night.  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  In the emergency room she eventually had dark brown diarrhea.  Noted to have a right pleural effusion.  Had a stool occult tested which was positive.  Her baseline hemoglobin 2 weeks back was around 9.6 g and on admission was 7.7 g.  Chest x-ray shows left pleural effusion and left lung atelectasis.  Underwent a thoracocentesis.  I have been consulted for stool occult test that is positive.  The patient returns from ultrasound after having a thoracocentesis, she denies any hemaemesis, melena , says she has occasional nose bleeds. Stool is brown in color.    Past Medical History:  Diagnosis Date  . Afib ([place])   . Anxiety   . Atrial fibrillation ([place]) 2017  . Cervical stenosis of spine   . COPD (chronic obstructive pulmonary disease) ([place])   . Depression   . Hyperlipidemia   . Hypothyroidism   . Nodule of right lung   . On supplemental oxygen by nasal cannula   . 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 Rupture of extensor tendon of finger   . Spondylosis of cervical region without myelopathy or radiculopathy   . Stroke ([place])   .  Vertigo   . Wartenberg syndrome     Past Surgical History:  Procedure Laterality Date  . ABDOMINAL HYSTERECTOMY    . APPENDECTOMY    . BREAST BIOPSY Left 1971  . BREAST CYST EXCISION Left 1971  . CATARACT SURGERY Bilateral   . LOOP RECORDER IMPLANT    . OOPHORECTOMY Bilateral   . VIDEO BRONCHOSCOPY WITH ENDOBRONCHIAL NAVIGATION N/A 05/30/2020   Procedure: VIDEO BRONCHOSCOPY WITH ENDOBRONCHIAL NAVIGATION;  Surgeon: Ottie Glazier, MD;  Location: [facility] ORS;  Service: Thoracic;  Laterality: N/A;  . VIDEO BRONCHOSCOPY WITH ENDOBRONCHIAL ULTRASOUND N/A 05/30/2020   Procedure: VIDEO BRONCHOSCOPY WITH ENDOBRONCHIAL ULTRASOUND;  Surgeon: Ottie Glazier, MD;  Location: [facility] ORS;  Service: Thoracic;  Laterality: N/A;    Prior to Admission medications   Medication Sig Start Date End Date Taking? Authorizing Provider  apixaban (ELIQUIS) 5 MG TABS tablet Take 1 tablet (5 mg total) by mouth 2 (two) times daily. 06/09/20  Yes Shawna Clamp, MD  BREO ELLIPTA 200-25 MCG/INH AEPB Inhale 1 puff into the lungs daily.   Yes [provider]  calcitRIOL (ROCALTROL) 0.5 MCG capsule Take 0.5 mcg by mouth daily.   Yes [provider]  Cholecalciferol (VITAMIN D) 50 MCG (2000 UT) CAPS Take 1 capsule by mouth daily. 03/13/20  Yes [provider]  DALIRESP 250 MCG  TABS Take 250 mcg by mouth daily.  11/17/19  Yes [provider]  escitalopram (LEXAPRO) 20 MG tablet Take 20 mg by mouth daily.   Yes [provider]  fenofibrate 160 MG tablet Take 160 mg by mouth daily.   Yes [provider]  ferrous gluconate (FERGON) 324 MG tablet Take 324 mg by mouth daily with breakfast.    Yes [provider]  INCRUSE ELLIPTA 62.5 MCG/INH AEPB Inhale 1 puff into the lungs daily.   Yes [provider]  levothyroxine (SYNTHROID) 75 MCG tablet Take 75 mcg by mouth daily before breakfast. 10/18/19  Yes [provider]  megestrol (MEGACE ES) 625 MG/5ML  suspension Take 5 mLs by mouth daily. 03/31/20  Yes [provider]  montelukast (SINGULAIR) 10 MG tablet Take 10 mg by mouth every morning.   Yes [provider]  potassium chloride (KLOR-CON) 10 MEQ tablet Take 10 mEq by mouth daily. 10/21/19  Yes [provider]  predniSONE (DELTASONE) 10 MG tablet Take 3 tablets (30 mg total) by mouth daily with breakfast. Advised to take prednisone 30 mg daily for 3 days then 20 mg daily for 3 days then 10 mg daily for 3 days and stop 06/10/20  Yes Shawna Clamp, MD  prochlorperazine (COMPAZINE) 10 MG tablet Take 1 tablet (10 mg total) by mouth every 6 (six) hours as needed for nausea or vomiting. 06/26/20  Yes Sindy Guadeloupe, MD  scopolamine (TRANSDERM-SCOP) 1 MG/3DAYS Place 1 patch (1.5 mg total) onto the skin every 3 (three) days. 06/10/20  Yes Shawna Clamp, MD  vitamin B-12 (CYANOCOBALAMIN) 1000 MCG tablet Take 1,000 mcg by mouth daily.   Yes [provider]  acetaminophen (TYLENOL) 500 MG tablet Take 1,000 mg by mouth every 6 (six) hours as needed for pain.    [provider]  ipratropium (ATROVENT) 0.03 % nasal spray Place 2 sprays into both nostrils daily. 06/01/20   [provider]    No family history on file.   Social History   Tobacco Use  . Smoking status: Former Research scientist (life sciences)  . Smokeless tobacco: Never Used  Vaping Use  . Vaping Use: Never used  Substance Use Topics  . Alcohol use: Never  . Drug use: Never    Allergies as of 06/26/2020 - Review Complete 06/26/2020  Allergen Reaction Noted  . Rivaroxaban Hives 02/02/2018    Review of Systems:    All systems reviewed and negative except where noted in HPI.   Physical Exam:  Vital signs in last 24 hours: Temp:  [97.7 F (36.5 C)-98.5 F (36.9 C)] 98.5 F (36.9 C) (02/02 0314) Pulse Rate:  [38-163] 75 (02/02 1041) Resp:  [15-37] 25 (02/02 1041) BP: (89-135)/(55-97) 120/88 (02/02 1041) SpO2:  [91 %-100 %] 94 % (02/02 1041) FiO2 (%):   [36 %-37 %] 37 % (02/02 1014) Weight:  [45.4 kg] 45.4 kg (02/01 1627)   General:   Using accessory muscles of respiration , appears very cachectic Head:  Normocephalic and atraumatic. Eyes:   No icterus.   Conjunctiva pink. PERRLA. Ears:  Normal auditory acuity. Neck:  Supple; no masses or thyroidomegaly Lungs: decreased air entry b/l  Heart:  Regular rate and rhythm;  Without murmur, clicks, rubs or gallops Abdomen:  Soft, nondistended, nontender. Normal bowel sounds. No appreciable masses or hepatomegaly.  No rebound or guarding.  Neurologic:  Alert and oriented x3;  grossly normal neurologically. Skin:  Intact without significant lesions or rashes. Cervical Nodes:  No significant  cervical adenopathy. Psych:  Alert and cooperative. Normal affect.  LAB RESULTS: Recent Labs    06/26/20 1634 06/27/20 0322  WBC 15.7* 12.5*  HGB 7.7* 8.2*  HCT 26.1* 26.8*  PLT 443* 379   BMET Recent Labs    06/26/20 1634 06/27/20 0322  NA 140 143  K 3.3* 3.9  CL 99 101  CO2 29 30  GLUCOSE 135* 157*  BUN 19 25*  CREATININE 0.55 0.82  CALCIUM 9.5 9.4   LFT Recent Labs    06/26/20 1634  PROT 6.1*  ALBUMIN 2.4*  AST 20  ALT 8  ALKPHOS 32*  BILITOT 0.5   PT/INR No results for input(s): LABPROT, INR in the last 72 hours.  STUDIES: DG Chest Portable 1 View  Result Date: 06/26/2020 CLINICAL DATA:  Shortness of breath EXAM: PORTABLE CHEST 1 VIEW COMPARISON:  06/05/2020 FINDINGS: Increased left pleural effusion and left lung atelectasis. Background changes COPD. No pneumothorax. Stable cardiomediastinal contours. IMPRESSION: Increased left pleural effusion and left lung atelectasis. Electronically Signed   By: Macy Mis M.D.   On: 06/26/2020 16:55      Impression / Plan:   SIDRA OLDFIELD is a 80 y.o. y/o female with history of advanced COPD on home oxygen recent diagnosis of small cell lung cancer, admitted with shortness of breath left pleural effusion.  I have been consulted  for anemia and a stool occult test that is positive.  She has a normocytic anemia which is at  her baseline.  She has endorsed in the emergency room that she had brown stools and denies any hematemesis or melena but does endorse having nose bleeds .  No elevation in the BUN/creatinine ratio.  She has had a prior diagnosis of iron deficiency anemia back in 2017.  She is on Eliquis for atrial fibrillation.  Plan 1.  With her respiratory failure I think the priority at this point of time should be improving her respiratory function which could permit in the future anesthesia and GI evaluation.  2.  With her hemoglobin close to her baseline and no overt blood loss I would not rush into any form of endoscopy evaluation as she is very high risk in terms of her respiratory failure and risk from anesthesia.  Stool occult blood test has no role in evaluation of anemia and is a test for colon cancer screening and is inappropriate to be used for evaluation of anemia , as a negative or positive test would not change evaluation.  If she has overt blood loss would perform a tagged RBC scan to localize a site of bleeding.  Otherwise would wait and watch closely.  Consider holding Eliquis if there is any evidence of a GI bleed in terms of hematemesis, hematochezia and drop in hemoglobin.  Continue PPI.  Follow-up iron studies B12 and if low please replace  3. She does mention she has had nose bleeds- consider monitoring and further evaluation   Thank you for involving me in the care of this patient.      LOS: 0 days   Christine Bellows, MD  06/27/2020, 11:21 AM

## 2020-06-27 NOTE — ED Notes (Signed)
Randol Kern, NP sent secure message notifying her of Magnesium of 1.6 and HGB of 8.2 after 1 unit of PRBCs. Pt in NAD at this time. VSS. Awaiting further orders. Will continue to monitor.

## 2020-06-27 NOTE — Consult Note (Signed)
[facility]  [phone] [phone]   Name: Christine Morris Date: 06/27/2020 MRN: [id number]  DOB: 26-Sep-1940  Patient Care Team: Chase Picket, MD as PCP - General (Family Medicine) Telford Nab, RN as Oncology Nurse Navigator    REASON FOR CONSULTATION: Christine Morris is a 80 y.o. 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  Patient was hospitalized 06/02/2020 to 06/12/2020 with acute on chronic respiratory failure secondary to COPD exacerbation.  Patient is now readmitted 06/26/2020 with same.  Patient was also found to have left-sided pleural effusion status post thoracentesis with removal of 900 mL.  Additionally, she was found to have acute on chronic anemia with heme positive stools.  Palliative care was consulted to help address goals.  SOCIAL HISTORY:     reports that she has quit smoking. She has never used smokeless tobacco. She reports that she does not drink alcohol and does not use drugs.  Patient is married and lives at home with her husband.  She has 2 adult children who also lives in the area.  Patient previously worked in [employer].  ADVANCE DIRECTIVES:  None on file  CODE STATUS: Full code  PAST MEDICAL HISTORY: Past Medical History:  Diagnosis Date  . Afib ([place])   . Anxiety   . Atrial fibrillation ([place]) 2017  . Cervical stenosis of spine   . COPD (chronic obstructive pulmonary disease) ([place])   . Depression   . Hyperlipidemia   . Hypothyroidism   . Nodule of right lung   . On supplemental oxygen by nasal cannula   . 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 Rupture of extensor tendon of finger   . Spondylosis of cervical region without myelopathy or radiculopathy   . Stroke ([place])   . Vertigo   .  Wartenberg syndrome     PAST SURGICAL HISTORY:  Past Surgical History:  Procedure Laterality Date  . ABDOMINAL HYSTERECTOMY    . APPENDECTOMY    . BREAST BIOPSY Left 1971  . BREAST CYST EXCISION Left 1971  . CATARACT SURGERY Bilateral   . LOOP RECORDER IMPLANT    . OOPHORECTOMY Bilateral   . VIDEO BRONCHOSCOPY WITH ENDOBRONCHIAL NAVIGATION N/A 05/30/2020   Procedure: VIDEO BRONCHOSCOPY WITH ENDOBRONCHIAL NAVIGATION;  Surgeon: Ottie Glazier, MD;  Location: [facility] ORS;  Service: Thoracic;  Laterality: N/A;  . VIDEO BRONCHOSCOPY WITH ENDOBRONCHIAL ULTRASOUND N/A 05/30/2020   Procedure: VIDEO BRONCHOSCOPY WITH ENDOBRONCHIAL ULTRASOUND;  Surgeon: Ottie Glazier, MD;  Location: [facility] ORS;  Service: Thoracic;  Laterality: N/A;    HEMATOLOGY/ONCOLOGY HISTORY:  Oncology History   No history exists.    ALLERGIES:  is allergic to rivaroxaban.  MEDICATIONS:  Current Facility-Administered Medications  Medication Dose Route Frequency Provider Last Rate Last Admin  . acetaminophen (TYLENOL) tablet 325 mg  325 mg Oral Q6H PRN Cox, Amy N, DO       Or  . acetaminophen (TYLENOL) suppository 325 mg  325 mg Rectal Q6H PRN Cox, Amy N, DO      . escitalopram (LEXAPRO) tablet 20 mg  20 mg Oral Daily Cox, Amy N, DO   20 mg at 06/27/20 1004  . fentaNYL (SUBLIMAZE) injection 12.5 mcg  12.5 mcg Intravenous Q2H PRN Cox, Amy N, DO   12.5 mcg at 06/27/20 1246  .  fluticasone furoate-vilanterol (BREO ELLIPTA) 200-25 MCG/INH 1 puff  1 puff Inhalation Daily Benita Gutter, RPH      . ipratropium-albuterol (DUONEB) 0.5-2.5 (3) MG/3ML nebulizer solution 3 mL  3 mL Nebulization Q6H WA Cox, Amy N, DO   3 mL at 06/27/20 1437  . levothyroxine (SYNTHROID) tablet 75 mcg  75 mcg Oral QAC breakfast Cox, Amy N, DO      . megestrol (MEGACE) 400 MG/10ML suspension 625 mg  625 mg Oral Daily Jennye Boroughs, MD   625 mg at 06/27/20 1449  . methylPREDNISolone sodium succinate (SOLU-MEDROL) 40 mg/mL injection 40 mg  40 mg  Intravenous Q12H Jennye Boroughs, MD   40 mg at 06/27/20 1721  . metoprolol tartrate (LOPRESSOR) injection 5 mg  5 mg Intravenous Q2H PRN Cox, Amy N, DO      . montelukast (SINGULAIR) tablet 10 mg  10 mg Oral BH-q7a Cox, Amy N, DO      . ondansetron (ZOFRAN) tablet 4 mg  4 mg Oral Q6H PRN Cox, Amy N, DO       Or  . ondansetron (ZOFRAN) injection 4 mg  4 mg Intravenous Q6H PRN Cox, Amy N, DO      . pantoprazole (PROTONIX) 80 mg in sodium chloride 0.9 % 100 mL (0.8 mg/mL) infusion  8 mg/hr Intravenous Continuous Cox, Amy N, DO 10 mL/hr at 06/27/20 1009 8 mg/hr at 06/27/20 1009  . roflumilast (DALIRESP) tablet 250 mcg  250 mcg Oral Daily Cox, Amy N, DO   250 mcg at 06/27/20 1004  . umeclidinium bromide (INCRUSE ELLIPTA) 62.5 MCG/INH 1 puff  1 puff Inhalation Daily Benita Gutter, RPH        VITAL SIGNS: BP 110/61 (BP Location: Left Arm)   Pulse 77   Temp 98.8 F (37.1 C)   Resp 19   Ht _0  (1.6 m)   Wt 100 lb (45.4 kg)   SpO2 95%   BMI 17.71 kg/m  Filed Weights   06/26/20 1627  Weight: 100 lb (45.4 kg)    Estimated body mass index is 17.71 kg/m as calculated from the following:   Height as of this encounter: _1  (1.6 m).   Weight as of this encounter: 100 lb (45.4 kg).  LABS: CBC:    Component Value Date/Time   WBC 12.5 (H) 06/27/2020 0322   HGB 8.2 (L) 06/27/2020 0322   HGB 10.2 (L) 09/30/2012 0432   HCT 26.8 (L) 06/27/2020 0322   HCT 30.5 (L) 09/30/2012 0432   PLT 379 06/27/2020 0322   PLT 266 09/30/2012 0432   MCV 92.4 06/27/2020 0322   MCV 89 09/30/2012 0432   NEUTROABS 13.6 (H) 06/02/2020 0451   NEUTROABS 18.6 (H) 09/30/2012 0432   LYMPHSABS 1.1 06/02/2020 0451   LYMPHSABS 0.4 (L) 09/30/2012 0432   MONOABS 0.9 06/02/2020 0451   MONOABS 0.4 09/30/2012 0432   EOSABS 0.3 06/02/2020 0451   EOSABS 0.0 09/30/2012 0432   BASOSABS 0.1 06/02/2020 0451   BASOSABS 0.0 09/30/2012 0432   Comprehensive Metabolic Panel:    Component Value Date/Time   NA 143  06/27/2020 0322   NA 142 10/01/2012 0411   K 3.9 06/27/2020 0322   K 3.5 10/01/2012 0411   CL 101 06/27/2020 0322   CL 108 (H) 10/01/2012 0411   CO2 30 06/27/2020 0322   CO2 28 10/01/2012 0411   BUN 25 (H) 06/27/2020 0322   BUN 15 10/01/2012 0411   CREATININE 0.82 06/27/2020 0322  CREATININE 0.60 10/01/2012 0411   GLUCOSE 157 (H) 06/27/2020 0322   GLUCOSE 151 (H) 10/01/2012 0411   CALCIUM 9.4 06/27/2020 0322   CALCIUM 9.6 10/01/2012 0411   AST 20 06/26/2020 1634   AST 28 09/29/2012 1541   ALT 8 06/26/2020 1634   ALT 20 09/29/2012 1541   ALKPHOS 32 (L) 06/26/2020 1634   ALKPHOS 71 09/29/2012 1541   BILITOT 0.5 06/26/2020 1634   BILITOT 0.7 09/29/2012 1541   PROT 6.1 (L) 06/26/2020 1634   PROT 7.2 09/29/2012 1541   ALBUMIN 2.4 (L) 06/26/2020 1634   ALBUMIN 3.6 09/29/2012 1541    RADIOGRAPHIC STUDIES: DG Chest 1 View  Result Date: 06/02/2020 CLINICAL DATA:  Shortness of breath, right lobe biopsy performed Wednesday EXAM: CHEST  1 VIEW COMPARISON:  Radiograph 12/16/2019, CT 05/30/2020 FINDINGS: There is persistent atelectasis and/or consolidation in the left lung base with a left pleural effusion as was seen on comparison CT imaging 3 days prior. Some additional bronchitic and bronchiectatic changes are seen diffusely throughout the lungs with some chronically coarsened interstitial changes and reticulonodular opacities throughout the lungs in a similar distribution to more remote comparison. No new consolidative opacity. No pneumothorax. No convincing CT features of edema at this time. The aorta is calcified. The remaining cardiomediastinal contours are unremarkable. No acute osseous or soft tissue abnormality. Degenerative changes are present in the imaged spine and shoulders. Telemetry leads and support devices overlie the chest. IMPRESSION: 1. Persistent atelectasis and/or consolidation in the left lung base with a left pleural effusion as was seen on comparison CT imaging. 2. Some  chronic bronchitic/bronchiectatic changes with coarsened interstitium and diffuse reticulonodular opacities conspicuous for a long-standing/chronic atypical infection including mycobacterial etiologies. 3. No other acute cardiopulmonary abnormalities. 4. The aorta is calcified. The remaining cardiomediastinal contours are unremarkable. Electronically Signed   By: Lovena Le M.D.   On: 06/02/2020 05:39   CT CHEST WO CONTRAST  Result Date: 05/30/2020 CLINICAL DATA:  Shortness of breath.  Preop for bronchoscopy. EXAM: CT CHEST WITHOUT CONTRAST TECHNIQUE: Multidetector CT imaging of the chest was performed following the standard protocol without IV contrast. COMPARISON:  May 03, 2020. FINDINGS: Cardiovascular: Atherosclerosis of thoracic aorta is noted without aneurysm formation. Normal cardiac size. Small pericardial effusion is noted. Coronary artery calcifications are noted. Mediastinum/Nodes: As noted on prior exam, there is extensive mediastinal adenopathy present particularly in the right paratracheal pretracheal subcarinal and left hilar regions. This results in narrowing of the left mainstem bronchus. Thyroid gland is unremarkable. Esophagus is unremarkable. Lungs/Pleura: No pneumothorax is noted. Mild biapical scarring is noted. Mild to moderate left pleural effusion is noted with adjacent left lower lobe postobstructive atelectasis or infiltrate. Upper Abdomen: No acute abnormality. Musculoskeletal: No chest wall mass or suspicious bone lesions identified. IMPRESSION: 1. Mild to moderate left pleural effusion is noted with adjacent left lower lobe postobstructive atelectasis or infiltrate. 2. Small pericardial effusion is noted. 3. Coronary artery calcifications are noted suggesting coronary artery disease. 4. As noted on prior exam, there is extensive mediastinal adenopathy present particularly in the right paratracheal, subcarinal and left hilar regions. This results in narrowing of the left  mainstem bronchus. This is concerning for malignancy or metastatic disease. Aortic Atherosclerosis (ICD10-I70.0). Electronically Signed   By: Marijo Conception M.D.   On: 05/30/2020 09:32   MR BRAIN W WO CONTRAST  Result Date: 06/05/2020 CLINICAL DATA:  Small-cell lung cancer, staging EXAM: MRI HEAD WITHOUT AND WITH CONTRAST TECHNIQUE: Multiplanar, multiecho pulse sequences of  the brain and surrounding structures were obtained without and with intravenous contrast. CONTRAST:  43m GADAVIST GADOBUTROL 1 MMOL/ML IV SOLN COMPARISON:  May 2019 FINDINGS: Motion artifact is present. Findings below are within this limitation. Brain: There is no acute infarction or intracranial hemorrhage. There is no intracranial mass, mass effect, or edema. There is no hydrocephalus or extra-axial fluid collection. Prominence of the ventricles and sulci reflects generalized parenchymal volume loss. Small chronic infarcts of the right frontal lobe cortex and caudate. Additional patchy and confluent areas of T2 hyperintensity in the supratentorial and pontine white matter are nonspecific but probably reflect moderate chronic microvascular ischemic changes. Appearance is similar to the prior study. No abnormal enhancement. Vascular: Major vessel flow voids at the skull base are preserved. Skull and upper cervical spine: Normal marrow signal is preserved. Sinuses/Orbits: Paranasal sinuses are aerated. Orbits are unremarkable. Other: Sella is unremarkable.  Mastoid air cells are clear. IMPRESSION: Suboptimal evaluation due to motion artifact. No evidence of intracranial metastatic disease. Small chronic infarcts and moderate chronic microvascular ischemic changes. Electronically Signed   By: PMacy MisM.D.   On: 06/05/2020 11:39   [place]Venous Img Lower Bilateral (DVT)  Result Date: 06/03/2020 CLINICAL DATA:  Shortness of breath EXAM: BILATERAL LOWER EXTREMITY VENOUS DOPPLER ULTRASOUND TECHNIQUE: Gray-scale sonography with compression,  as well as color and duplex ultrasound, were performed to evaluate the deep venous system(s) from the level of the common femoral vein through the popliteal and proximal calf veins. COMPARISON:  None. FINDINGS: VENOUS Normal compressibility of the common femoral, superficial femoral, and popliteal veins, as well as the visualized calf veins. Visualized portions of profunda femoral vein and great saphenous vein unremarkable. No filling defects to suggest DVT on grayscale or color Doppler imaging. Doppler waveforms show normal direction of venous flow, normal respiratory plasticity and response to augmentation. Limited views of the contralateral common femoral vein are unremarkable. OTHER None. Limitations: none IMPRESSION: No acute deep vein thrombosis in the visualized bilateral lower extremities. Electronically Signed   By: SValentino SaxonMD   On: 06/03/2020 13:20   DG Chest Port 1 View  Result Date: 06/27/2020 CLINICAL DATA:  Status post left thoracentesis. EXAM: PORTABLE CHEST 1 VIEW COMPARISON:  Chest radiograph of June 26, 2020 FINDINGS: The heart size and mediastinal contours are unchanged. Decreased size of the now small left pleural effusion. Decreased left basilar atelectasis. No visible pneumothorax. Chronic changes of COPD. The visualized skeletal structures are unchanged. IMPRESSION: Decreased size of the now small left pleural effusion post thoracentesis. No visible pneumothorax. Electronically Signed   By: JDahlia BailiffMD   On: 06/27/2020 11:40   DG Chest Portable 1 View  Result Date: 06/26/2020 CLINICAL DATA:  Shortness of breath EXAM: PORTABLE CHEST 1 VIEW COMPARISON:  06/05/2020 FINDINGS: Increased left pleural effusion and left lung atelectasis. Background changes COPD. No pneumothorax. Stable cardiomediastinal contours. IMPRESSION: Increased left pleural effusion and left lung atelectasis. Electronically Signed   By: PMacy MisM.D.   On: 06/26/2020 16:55   DG Chest Port 1  View  Result Date: 06/05/2020 CLINICAL DATA:  Shortness of breath EXAM: PORTABLE CHEST 1 VIEW COMPARISON:  06/02/2020 FINDINGS: Mild blunting of the left costophrenic angle is unchanged. Mild chronic bronchitic type changes. Calcific aortic atherosclerosis. There is left retrocardiac consolidation/atelectasis, unchanged. IMPRESSION: Unchanged left retrocardiac consolidation/atelectasis. Electronically Signed   By: KUlyses JarredM.D.   On: 06/05/2020 02:30   [place]ORACENTESIS ASP PLEURAL SPACE W/IMG GUIDE  Result Date: 06/27/2020 INDICATION: Symptomatic  left sided pleural effusion. Please perform ultrasound-guided thoracentesis for diagnostic and therapeutic purposes. EXAM: US THORACENTESIS ASP PLEURAL SPACE W/IMG GUIDE COMPARISON:  Chest radiograph-06/26/2020; chest CT-05/30/2020 MEDICATIONS: None. COMPLICATIONS: None immediate. TECHNIQUE: Informed written consent was obtained from the patient after a discussion of the risks, benefits and alternatives to treatment. A timeout was performed prior to the initiation of the procedure. Initial ultrasound scanning demonstrates a moderate to large sized predominantly anechoic left-sided pleural effusion. The lower chest was prepped and draped in the usual sterile fashion. 1% lidocaine was used for local anesthesia. An ultrasound image was saved for documentation purposes. An 8 Fr Safe-T-Centesis catheter was introduced. The thoracentesis was performed. The catheter was removed and a dressing was applied. The patient tolerated the procedure well without immediate post procedural complication. The patient was escorted to have an upright chest radiograph. FINDINGS: A total of approximately 900 cc of slightly blood tinged serous fluid was removed. Requested samples were sent to the laboratory. IMPRESSION: Successful ultrasound-guided left sided thoracentesis yielding 900 cc of pleural fluid. Electronically Signed   By: Sandi Mariscal M.D.   On: 06/27/2020 12:56     PERFORMANCE STATUS (ECOG) : 2 - Symptomatic, <50% confined to bed  Review of Systems Unless otherwise noted, a complete review of systems is negative.  Physical Exam General: NAD, thin, frail-appearing Pulmonary: On high flow nasal cannula, unlabored Extremities: no edema, no joint deformities Skin: no rashes Neurological: Weakness but otherwise nonfocal  IMPRESSION: I met with patient and husband.  I introduced palliative care services and attempted to establish therapeutic rapport.    Patient reports that her breathing is improved following thoracentesis.  However, she remains on high flow nasal cannula.  At baseline, patient says that she uses 3 to 4 L of O2 at home.  She is mostly independent with her ADLs but requires some assistance from husband at times.  She ambulates with use of a walker and admits to chronic exertional dyspnea. She often has to take breaks to catch her breath as she ambulates.   Patient is aware that she has a diagnosis of cancer but seems to have limited understanding of the prognosis and treatment options.  I note patient was pending PET scan to complete work-up.  Patient has not note had any close exposure to cancer treatments through family or friends.  We discussed what chemotherapy or radiation would generally entail.  Patient is interested in speaking with radiation and medical oncology to explore options.  However, she says that she does not think that she would want treatment that would negatively impact her quality of life, even if forgoing treatment meant less quantity of life.  She verbalized understanding that her overall frailty and advanced comorbidities (e.g. O2 dependent COPD) might affect her treatment options.    Patient says that she thinks she completed ACP documents some years ago.  She would want her husband to be her decision-maker if needed.  She would also want her daughters to be involved in decision-making.  Patient has two  hospitalizations in the past month for acute on chronic respiratory failure.  We discussed CODE STATUS today.  Patient does not think that she would want to be intubated or have her life prolonged artificially on machines.  Initially, she thought that she might want CPR but later seemed to change her mind when we discussed the probable traumatic nature and futility associated with resuscitative efforts in the setting of advanced or terminal diseases.  She wants to speak with  her husband and daughters prior to making any decisions.  PLAN: -Continue current scope of treatment -Patient to discuss code status with family -Will plan outpatient follow up in the [place] following discharge from the hospital   Time Total: 60 minutes  Visit consisted of counseling and education dealing with the complex and emotionally intense issues of symptom management and palliative care in the setting of serious and potentially life-threatening illness.Greater than 50%  of this time was spent counseling and coordinating care related to the above assessment and plan.  Signed by: Altha Harm, PhD, NP-C

## 2020-06-27 NOTE — Progress Notes (Addendum)
Progress Note    Christine Morris  OEU:[id number] DOB: 03/26/1941  DOA: 06/26/2020 PCP: Chase Picket, MD      Brief Narrative:    Medical records reviewed and are as summarized below:  Christine Morris is a 80 y.o. female       Assessment/Plan:   Principal Problem:   Acute on chronic respiratory failure ([place]) Active Problems:   COPD (chronic obstructive pulmonary disease) with acute bronchitis (HCC)   Anxiety and depression   Lung cancer ([place])   COPD with acute exacerbation (HCC)   Pleural effusion on left   Acute on chronic respiratory failure with hypoxia ([place])   Body mass index is 17.71 kg/m.  (Underweight)    Acute on chronic hypoxic respiratory failure: She is on oxygen via heated humidified High flow nasal cannula (FiO2 35% at 45 L/min).  She uses 3 L with its oxygen at home.  Taper down oxygen as able.  Left pleural effusion: S/p left-sided thoracentesis with removal of 900 cc of fluid today.  COPD with suspected exacerbation contributing to hypoxia: Continue bronchodilators.  Add steroids.  Anemia of chronic disease, iron deficiency anemia, positive heme stools: s/p transfusion with 1 unit of PRBCs.  Hold Eliquis.  She has been evaluated by the gastroenterologist.  No plan for immediate endoscopic work-up.  Atrial fibrillation with RVR: Eliquis has been held.  Hypokalemia: Improved  Hypomagnesemia: Replete magnesium with IV magnesium sulfate.  Right upper lobe lung cancer: Outpatient follow-up with gastroenterologist.  History of stroke, hypothyroidism and depression.   Diet Order            Diet Heart Room service appropriate? Yes; Fluid consistency: Thin  Diet effective now                    Consultants:  None  Procedures:  Left-sided thoracentesis with removal of 900 cc of fluid    Medications:   . escitalopram  20 mg Oral Daily  . fluticasone furoate-vilanterol  1 puff Inhalation Daily  . ipratropium-albuterol   3 mL Nebulization Q6H WA  . levothyroxine  75 mcg Oral QAC breakfast  . megestrol  625 mg Oral Daily  . methylPREDNISolone (SOLU-MEDROL) injection  40 mg Intravenous Q12H  . montelukast  10 mg Oral BH-q7a  . roflumilast  250 mcg Oral Daily  . umeclidinium bromide  1 puff Inhalation Daily   Continuous Infusions: . pantoprozole (PROTONIX) infusion 8 mg/hr (06/27/20 1009)     Anti-infectives (From admission, onward)   None             Family Communication/Anticipated D/C date and plan/Code Status   DVT prophylaxis: Place TED hose Start: 06/26/20 2010     Code Status: Full Code  Family Communication: With husband at bedside Disposition Plan:    Status is: Inpatient  Remains inpatient appropriate because:Inpatient level of care appropriate due to severity of illness   Dispo: The patient is from: Home              Anticipated d/c is to: Home              Anticipated d/c date is: 2 days              Patient currently is not medically stable to d/c.   Difficult to place patient No              Subjective:   Interval events noted.  She complains of left shoulder and  left-sided low back pain at thoracentesis site.  Objective:    Vitals:   06/27/20 1220 06/27/20 1222 06/27/20 1440 06/27/20 1619  BP: 114/67   110/61  Pulse: 75   77  Resp: (!) 22   19  Temp: 98.1 F (36.7 C)   98.8 F (37.1 C)  TempSrc:      SpO2: 100% 96% 93% 95%  Weight:      Height:       No data found.   Intake/Output Summary (Last 24 hours) at 06/27/2020 1711 Last data filed at 06/27/2020 0733 Gross per 24 hour  Intake 840.9 ml  Output --  Net 840.9 ml   Filed Weights   06/26/20 1627  Weight: 45.4 kg    Exam:  GEN: NAD, cachetic SKIN: Warm and dry.  Multiple bruises on bilateral upper extremities EYES: EOMI ENT: MMM CV: RRR PULM: Bibasilar rales.  No wheezing heard. ABD: soft, ND, NT, +BS CNS: AAO x 3, non focal EXT: No edema or tenderness    Data  Reviewed:   I have personally reviewed following labs and imaging studies:  Labs: Labs show the following:   Basic Metabolic Panel: Recent Labs  Lab 06/26/20 1634 06/27/20 0322  NA 140 143  K 3.3* 3.9  CL 99 101  CO2 29 30  GLUCOSE 135* 157*  BUN 19 25*  CREATININE 0.55 0.82  CALCIUM 9.5 9.4  MG  --  1.6*   GFR Estimated Creatinine Clearance: 40.5 mL/min (by C-G formula based on SCr of 0.82 mg/dL). Liver Function Tests: Recent Labs  Lab 06/26/20 1634  AST 20  ALT 8  ALKPHOS 32*  BILITOT 0.5  PROT 6.1*  ALBUMIN 2.4*   No results for input(s): LIPASE, AMYLASE in the last 168 hours. No results for input(s): AMMONIA in the last 168 hours. Coagulation profile No results for input(s): INR, PROTIME in the last 168 hours.  CBC: Recent Labs  Lab 06/26/20 1634 06/27/20 0322  WBC 15.7* 12.5*  HGB 7.7* 8.2*  HCT 26.1* 26.8*  MCV 94.2 92.4  PLT 443* 379   Cardiac Enzymes: No results for input(s): CKTOTAL, CKMB, CKMBINDEX, TROPONINI in the last 168 hours. BNP (last 3 results) No results for input(s): PROBNP in the last 8760 hours. CBG: No results for input(s): GLUCAP in the last 168 hours. D-Dimer: No results for input(s): DDIMER in the last 72 hours. Hgb A1c: No results for input(s): HGBA1C in the last 72 hours. Lipid Profile: No results for input(s): CHOL, HDL, LDLCALC, TRIG, CHOLHDL, LDLDIRECT in the last 72 hours. Thyroid function studies: No results for input(s): TSH, T4TOTAL, T3FREE, THYROIDAB in the last 72 hours.  Invalid input(s): FREET3 Anemia work up: Recent Labs    06/27/20 0238 06/27/20 1302  VITAMINB12  --  1,855*  FOLATE 6.7  --   FERRITIN 75  --   TIBC 263  --   IRON 11*  --    Sepsis Labs: Recent Labs  Lab 06/26/20 1634 06/27/20 0322  WBC 15.7* 12.5*    Microbiology Recent Results (from the past 240 hour(s))  SARS Coronavirus 2 by RT PCR (hospital order, performed in [facility] hospital lab) Nasopharyngeal Nasopharyngeal Swab      Status: None   Collection Time: 06/26/20  4:29 PM   Specimen: Nasopharyngeal Swab  Result Value Ref Range Status   SARS Coronavirus 2 NEGATIVE NEGATIVE Final    Comment: (NOTE) SARS-CoV-2 target nucleic acids are NOT DETECTED.  The SARS-CoV-2 RNA is generally detectable  in upper and lower respiratory specimens during the acute phase of infection. The lowest concentration of SARS-CoV-2 viral copies this assay can detect is 250 copies / mL. A negative result does not preclude SARS-CoV-2 infection and should not be used as the sole basis for treatment or other patient management decisions.  A negative result may occur with improper specimen collection / handling, submission of specimen other than nasopharyngeal swab, presence of viral mutation(s) within the areas targeted by this assay, and inadequate number of viral copies (<250 copies / mL). A negative result must be combined with clinical observations, patient history, and epidemiological information.  Fact Sheet for Patients:   StrictlyIdeas.no  Fact Sheet for Healthcare Providers: BankingDealers.co.za  This test is not yet approved or  cleared by the [place] FDA and has been authorized for detection and/or diagnosis of SARS-CoV-2 by FDA under an Emergency Use Authorization (EUA).  This EUA will remain in effect (meaning this test can be used) for the duration of the COVID-19 declaration under Section 564(b)(1) of the Act, 21 U.S.C. section 360bbb-3(b)(1), unless the authorization is terminated or revoked sooner.  Performed at [facility], [address], [place], [place] [address]     Procedures and diagnostic studies:  DG Chest [facility] 1 View  Result Date: 06/27/2020 CLINICAL DATA:  Status post left thoracentesis. EXAM: PORTABLE CHEST 1 VIEW COMPARISON:  Chest radiograph of June 26, 2020 FINDINGS: The heart size and mediastinal contours are unchanged.  Decreased size of the now small left pleural effusion. Decreased left basilar atelectasis. No visible pneumothorax. Chronic changes of COPD. The visualized skeletal structures are unchanged. IMPRESSION: Decreased size of the now small left pleural effusion post thoracentesis. No visible pneumothorax. Electronically Signed   By: Dahlia Bailiff MD   On: 06/27/2020 11:40   DG Chest Portable 1 View  Result Date: 06/26/2020 CLINICAL DATA:  Shortness of breath EXAM: PORTABLE CHEST 1 VIEW COMPARISON:  06/05/2020 FINDINGS: Increased left pleural effusion and left lung atelectasis. Background changes COPD. No pneumothorax. Stable cardiomediastinal contours. IMPRESSION: Increased left pleural effusion and left lung atelectasis. Electronically Signed   By: Macy Mis M.D.   On: 06/26/2020 16:55   US THORACENTESIS ASP PLEURAL SPACE W/IMG GUIDE  Result Date: 06/27/2020 INDICATION: Symptomatic left sided pleural effusion. Please perform ultrasound-guided thoracentesis for diagnostic and therapeutic purposes. EXAM: US THORACENTESIS ASP PLEURAL SPACE W/IMG GUIDE COMPARISON:  Chest radiograph-06/26/2020; chest CT-05/30/2020 MEDICATIONS: None. COMPLICATIONS: None immediate. TECHNIQUE: Informed written consent was obtained from the patient after a discussion of the risks, benefits and alternatives to treatment. A timeout was performed prior to the initiation of the procedure. Initial ultrasound scanning demonstrates a moderate to large sized predominantly anechoic left-sided pleural effusion. The lower chest was prepped and draped in the usual sterile fashion. 1% lidocaine was used for local anesthesia. An ultrasound image was saved for documentation purposes. An 8 Fr Safe-T-Centesis catheter was introduced. The thoracentesis was performed. The catheter was removed and a dressing was applied. The patient tolerated the procedure well without immediate post procedural complication. The patient was escorted to have an upright  chest radiograph. FINDINGS: A total of approximately 900 cc of slightly blood tinged serous fluid was removed. Requested samples were sent to the laboratory. IMPRESSION: Successful ultrasound-guided left sided thoracentesis yielding 900 cc of pleural fluid. Electronically Signed   By: Sandi Mariscal M.D.   On: 06/27/2020 12:56               LOS: 0 days  Lauria Depoy  [employer], advertising on [email or web address]. If 7PM-7AM, please contact night-coverage at [email or web address]     06/27/2020, 5:11 PM

## 2020-06-27 NOTE — ED Notes (Signed)
Pt having increased work of breathing. Randol Kern, NP notified via secure chat and paged. Spoke with Randol Kern, NP on phone who stated RT was on the way to evaluate pt. VSS at this time. Will continue to monitor.

## 2020-06-27 NOTE — ED Notes (Signed)
Pt states that she is feeling much better and pts work of breathing has improved. Awaiting further orders. Will continue to monitor.

## 2020-06-27 NOTE — ED Notes (Signed)
Patient transported to Ultrasound 

## 2020-06-27 NOTE — Procedures (Signed)
Pre procedural Dx: Symptomatic pleural effusion Post procedural Dx: Same  Successful US guided left sided thoracentesis yielding 900 cc of slightly blood tinged serous pleural fluid.   Samples sent to lab for analysis.  EBL: None Complications: None immediate.  Ronny Bacon, MD Pager #: [phone]

## 2020-06-27 NOTE — ED Notes (Signed)
PRBC rate changed to 152ml./hr.

## 2020-06-28 ENCOUNTER — Inpatient Hospital Stay: Payer: Medicare Other

## 2020-06-28 ENCOUNTER — Inpatient Hospital Stay: Payer: Medicare Other | Admitting: Hospice and Palliative Medicine

## 2020-06-28 ENCOUNTER — Inpatient Hospital Stay: Payer: Medicare Other | Admitting: Oncology

## 2020-06-28 ENCOUNTER — Ambulatory Visit: Payer: Medicare Other | Admitting: Radiation Oncology

## 2020-06-28 DIAGNOSIS — K922 Gastrointestinal hemorrhage, unspecified: Secondary | ICD-10-CM | POA: Diagnosis not present

## 2020-06-28 DIAGNOSIS — C3411 Malignant neoplasm of upper lobe, right bronchus or lung: Secondary | ICD-10-CM | POA: Diagnosis not present

## 2020-06-28 DIAGNOSIS — J90 Pleural effusion, not elsewhere classified: Secondary | ICD-10-CM | POA: Diagnosis not present

## 2020-06-28 DIAGNOSIS — C3492 Malignant neoplasm of unspecified part of left bronchus or lung: Secondary | ICD-10-CM | POA: Diagnosis not present

## 2020-06-28 DIAGNOSIS — J9621 Acute and chronic respiratory failure with hypoxia: Secondary | ICD-10-CM | POA: Diagnosis not present

## 2020-06-28 LAB — CBC WITH DIFFERENTIAL/PLATELET
Abs Immature Granulocytes: 0.07 10*3/uL (ref 0.00–0.07)
Basophils Absolute: 0 10*3/uL (ref 0.0–0.1)
Basophils Relative: 0 %
Eosinophils Absolute: 0 10*3/uL (ref 0.0–0.5)
Eosinophils Relative: 0 %
HCT: 27.4 % — ABNORMAL LOW (ref 36.0–46.0)
Hemoglobin: 8.5 g/dL — ABNORMAL LOW (ref 12.0–15.0)
Immature Granulocytes: 1 %
Lymphocytes Relative: 4 %
Lymphs Abs: 0.3 10*3/uL — ABNORMAL LOW (ref 0.7–4.0)
MCH: 28.1 pg (ref 26.0–34.0)
MCHC: 31 g/dL (ref 30.0–36.0)
MCV: 90.4 fL (ref 80.0–100.0)
Monocytes Absolute: 0.3 10*3/uL (ref 0.1–1.0)
Monocytes Relative: 4 %
Neutro Abs: 7.9 10*3/uL — ABNORMAL HIGH (ref 1.7–7.7)
Neutrophils Relative %: 91 %
Platelets: 356 10*3/uL (ref 150–400)
RBC: 3.03 MIL/uL — ABNORMAL LOW (ref 3.87–5.11)
RDW: 15.8 % — ABNORMAL HIGH (ref 11.5–15.5)
WBC: 8.6 10*3/uL (ref 4.0–10.5)
nRBC: 0 % (ref 0.0–0.2)

## 2020-06-28 LAB — MAGNESIUM: Magnesium: 2 mg/dL (ref 1.7–2.4)

## 2020-06-28 LAB — BASIC METABOLIC PANEL
Anion gap: 10 (ref 5–15)
BUN: 27 mg/dL — ABNORMAL HIGH (ref 8–23)
CO2: 30 mmol/L (ref 22–32)
Calcium: 9 mg/dL (ref 8.9–10.3)
Chloride: 99 mmol/L (ref 98–111)
Creatinine, Ser: 0.72 mg/dL (ref 0.44–1.00)
GFR, Estimated: >60 mL/min (ref >60)
Glucose, Bld: 111 mg/dL — ABNORMAL HIGH (ref 70–99)
Potassium: 4 mmol/L (ref 3.5–5.1)
Sodium: 139 mmol/L (ref 135–145)

## 2020-06-28 LAB — PHOSPHORUS: Phosphorus: 1.9 mg/dL — ABNORMAL LOW (ref 2.5–4.6)

## 2020-06-28 LAB — TRIGLYCERIDES, BODY FLUIDS: Triglycerides, Fluid: 23 mg/dL

## 2020-06-28 LAB — ACID FAST SMEAR (AFB, MYCOBACTERIA): Acid Fast Smear: NEGATIVE

## 2020-06-28 LAB — CYTOLOGY - NON PAP

## 2020-06-28 LAB — LACTATE DEHYDROGENASE: LDH: 215 U/L — ABNORMAL HIGH (ref 98–192)

## 2020-06-28 MED ORDER — MORPHINE SULFATE (PF) 2 MG/ML IV SOLN
2.0000 mg | INTRAVENOUS | Status: DC | PRN
  Administered 2020-07-02: 2 mg via INTRAVENOUS
  Filled 2020-06-28: qty 1

## 2020-06-28 MED ORDER — K PHOS MONO-SOD PHOS DI & MONO 155-852-130 MG PO TABS
500.0000 mg | ORAL_TABLET | Freq: Three times a day (TID) | ORAL | Status: AC
  Administered 2020-06-28 – 2020-06-29 (×6): 500 mg via ORAL
  Filled 2020-06-28 (×6): qty 2

## 2020-06-28 MED ORDER — SODIUM CHLORIDE 0.9 % IV SOLN
1.0000 g | INTRAVENOUS | Status: DC
  Administered 2020-06-28 – 2020-06-30 (×3): 1 g via INTRAVENOUS
  Filled 2020-06-28 (×2): qty 1
  Filled 2020-06-28 (×2): qty 10

## 2020-06-28 MED ORDER — PREDNISONE 20 MG PO TABS
40.0000 mg | ORAL_TABLET | Freq: Every day | ORAL | Status: AC
  Administered 2020-06-29 – 2020-07-01 (×3): 40 mg via ORAL
  Filled 2020-06-28 (×3): qty 2

## 2020-06-28 MED ORDER — METHYLPREDNISOLONE SODIUM SUCC 40 MG IJ SOLR
40.0000 mg | Freq: Once | INTRAMUSCULAR | Status: AC
  Administered 2020-06-28: 40 mg via INTRAVENOUS
  Filled 2020-06-28: qty 1

## 2020-06-28 MED ORDER — FERROUS SULFATE 325 (65 FE) MG PO TABS
325.0000 mg | ORAL_TABLET | ORAL | Status: DC
  Administered 2020-06-29 – 2020-07-03 (×4): 325 mg via ORAL
  Filled 2020-06-28 (×4): qty 1

## 2020-06-28 MED ORDER — OXYCODONE HCL 5 MG PO TABS
5.0000 mg | ORAL_TABLET | ORAL | Status: DC | PRN
  Administered 2020-06-28 – 2020-07-02 (×3): 5 mg via ORAL
  Filled 2020-06-28 (×3): qty 1

## 2020-06-28 MED ORDER — SODIUM CHLORIDE 0.9% FLUSH
3.0000 mL | INTRAVENOUS | Status: DC | PRN
  Administered 2020-06-29: 3 mL via INTRAVENOUS

## 2020-06-28 NOTE — Progress Notes (Signed)
Christine Lame, MD [address] [place], [place] [address] Phone: [phone] Fax : [phone]   Subjective: Dr. Georgeann Oppenheim consult note reviewed.  The patient was admitted with a hemoglobin of 7.7 and got 1 unit of blood with a repeat of 8.2.  The hemoglobin today was 8.5.  There has been no sign of any GI bleeding.  The patient reports that she is nowhere near her baseline for a respiratory point of view and she is still very short of breath.   Objective: Vital signs in last 24 hours: Vitals:   06/27/20 2102 06/28/20 0400 06/28/20 0500 06/28/20 0856  BP: 112/63 124/63  135/75  Pulse: 100 85  (!) 103  Resp: 17 18    Temp: 98.2 F (36.8 C) 98.2 F (36.8 C)  (!) 97.4 F (36.3 C)  TempSrc: Oral Oral  Oral  SpO2: 94% 93%  95%  Weight:   45.1 kg   Height:       Weight change: -0.26 kg  Intake/Output Summary (Last 24 hours) at 06/28/2020 1313 Last data filed at 06/28/2020 0930 Gross per 24 hour  Intake 360 ml  Output 300 ml  Net 60 ml     Exam: Heart:: Regular rate and rhythm, S1S2 present or without murmur or extra heart sounds Lungs: Diffuse rhonchi Abdomen: soft, nontender, normal bowel sounds   Lab Results: @LABTEST2 @ Micro Results: Recent Results (from the past 240 hour(s))  SARS Coronavirus 2 by RT PCR (hospital order, performed in [facility] hospital lab) Nasopharyngeal Nasopharyngeal Swab     Status: None   Collection Time: 06/26/20  4:29 PM   Specimen: Nasopharyngeal Swab  Result Value Ref Range Status   SARS Coronavirus 2 NEGATIVE NEGATIVE Final    Comment: (NOTE) SARS-CoV-2 target nucleic acids are NOT DETECTED.  The SARS-CoV-2 RNA is generally detectable in upper and lower respiratory specimens during the acute phase of infection. The lowest concentration of SARS-CoV-2 viral copies this assay can detect is 250 copies / mL. A negative result does not preclude SARS-CoV-2 infection and should not be used as the sole basis for treatment or  other patient management decisions.  A negative result may occur with improper specimen collection / handling, submission of specimen other than nasopharyngeal swab, presence of viral mutation(s) within the areas targeted by this assay, and inadequate number of viral copies (<250 copies / mL). A negative result must be combined with clinical observations, patient history, and epidemiological information.  Fact Sheet for Patients:   StrictlyIdeas.no  Fact Sheet for Healthcare Providers: BankingDealers.co.za  This test is not yet approved or  cleared by the [place] FDA and has been authorized for detection and/or diagnosis of SARS-CoV-2 by FDA under an Emergency Use Authorization (EUA).  This EUA will remain in effect (meaning this test can be used) for the duration of the COVID-19 declaration under Section 564(b)(1) of the Act, 21 U.S.C. section 360bbb-3(b)(1), unless the authorization is terminated or revoked sooner.  Performed at [facility], [address] [place], [place] [address]   Body fluid culture     Status: None (Preliminary result)   Collection Time: 06/27/20 11:38 AM   Specimen: PATH Cytology Pleural fluid  Result Value Ref Range Status   Specimen Description   Final    PLEURAL Performed at [facility], [address] [place],  [address]    Special Requests   Final    NONE Performed at [facility], [address] [place], [place] [address]    Gram Stain   Final    MODERATE WBC PRESENT, PREDOMINANTLY PMN NO ORGANISMS SEEN    Culture   Final    NO GROWTH < 24 HOURS Performed at [facility] [address] [place], [place] [address]    Report Status PENDING  Incomplete   Studies/Results: DG Chest Port 1 View  Result Date: 06/27/2020 CLINICAL DATA:  Status post left thoracentesis. EXAM: PORTABLE CHEST 1 VIEW COMPARISON:  Chest radiograph of June 26, 2020  FINDINGS: The heart size and mediastinal contours are unchanged. Decreased size of the now small left pleural effusion. Decreased left basilar atelectasis. No visible pneumothorax. Chronic changes of COPD. The visualized skeletal structures are unchanged. IMPRESSION: Decreased size of the now small left pleural effusion post thoracentesis. No visible pneumothorax. Electronically Signed   By: Dahlia Bailiff MD   On: 06/27/2020 11:40   DG Chest Portable 1 View  Result Date: 06/26/2020 CLINICAL DATA:  Shortness of breath EXAM: PORTABLE CHEST 1 VIEW COMPARISON:  06/05/2020 FINDINGS: Increased left pleural effusion and left lung atelectasis. Background changes COPD. No pneumothorax. Stable cardiomediastinal contours. IMPRESSION: Increased left pleural effusion and left lung atelectasis. Electronically Signed   By: Macy Mis M.D.   On: 06/26/2020 16:55   US THORACENTESIS ASP PLEURAL SPACE W/IMG GUIDE  Result Date: 06/27/2020 INDICATION: Symptomatic left sided pleural effusion. Please perform ultrasound-guided thoracentesis for diagnostic and therapeutic purposes. EXAM: US THORACENTESIS ASP PLEURAL SPACE W/IMG GUIDE COMPARISON:  Chest radiograph-06/26/2020; chest CT-05/30/2020 MEDICATIONS: None. COMPLICATIONS: None immediate. TECHNIQUE: Informed written consent was obtained from the patient after a discussion of the risks, benefits and alternatives to treatment. A timeout was performed prior to the initiation of the procedure. Initial ultrasound scanning demonstrates a moderate to large sized predominantly anechoic left-sided pleural effusion. The lower chest was prepped and draped in the usual sterile fashion. 1% lidocaine was used for local anesthesia. An ultrasound image was saved for documentation purposes. An 8 Fr Safe-T-Centesis catheter was introduced. The thoracentesis was performed. The catheter was removed and a dressing was applied. The patient tolerated the procedure well without immediate post  procedural complication. The patient was escorted to have an upright chest radiograph. FINDINGS: A total of approximately 900 cc of slightly blood tinged serous fluid was removed. Requested samples were sent to the laboratory. IMPRESSION: Successful ultrasound-guided left sided thoracentesis yielding 900 cc of pleural fluid. Electronically Signed   By: Sandi Mariscal M.D.   On: 06/27/2020 12:56   Medications: I have reviewed the patient's current medications. Scheduled Meds: . escitalopram  20 mg Oral Daily  . fluticasone furoate-vilanterol  1 puff Inhalation Daily  . ipratropium-albuterol  3 mL Nebulization Q6H WA  . levothyroxine  75 mcg Oral QAC breakfast  . megestrol  625 mg Oral Daily  . methylPREDNISolone (SOLU-MEDROL) injection  40 mg Intravenous Once  . montelukast  10 mg Oral BH-q7a  . phosphorus  500 mg Oral TID  . [START ON 06/29/2020] predniSONE  40 mg Oral Q breakfast  . roflumilast  250 mcg Oral Daily  . umeclidinium bromide  1 puff Inhalation Daily   Continuous Infusions: . pantoprozole (PROTONIX) infusion 8 mg/hr (06/27/20 2017)   PRN Meds:.acetaminophen **OR** acetaminophen, metoprolol tartrate, ondansetron **OR** ondansetron (ZOFRAN) IV   Assessment: Principal Problem:   Acute on chronic respiratory failure (HCC) Active Problems:   COPD (chronic obstructive pulmonary disease) with acute bronchitis (HCC)   Anxiety and depression   Lung cancer ([place])  COPD with acute exacerbation (HCC)   Pleural effusion on left   Acute on chronic respiratory failure with hypoxia [facility])   Palliative care encounter    Plan: This patient has a stable hemoglobin with no sign of active bleeding.  The patient had a Hemoccult that was positive. Stool occult blood test has no role in evaluation of anemia and is a test for colon cancer screening and is inappropriate to be used for evaluation of anemia, as a negative or positive test would not change evaluation.  The patient is not in any need  of a urgent GI work-up for her anemia that was also noted to be present back in 2017 which she had an evaluation at [facility] at that time.  The patient should be stabilized from a respiratory point of view and follow-up with GI as an outpatient.  I will sign off.  Please call if any further GI concerns or questions.  We would like to thank you for the opportunity to participate in the care of Christine Morris.  Please let us know if the patient has any sign of GI bleeding and we would be happy to see the patient again.    LOS: 1 day   Lewayne Bunting 06/28/2020, 1:13 PM Pager [phone] 7am-5pm  Check AMION for 5pm -7am coverage and on weekends

## 2020-06-28 NOTE — Progress Notes (Signed)
Progress Note    Christine Morris  EYC:[id number] DOB: [birth year]  DOA: 06/26/2020 PCP: Chase Picket, MD      Brief Narrative:    Medical records reviewed and are as summarized below:  Christine Morris is a 80 y.o. female       Assessment/Plan:   Principal Problem:   Acute on chronic respiratory failure ([place]) Active Problems:   COPD (chronic obstructive pulmonary disease) with acute bronchitis (HCC)   Anxiety and depression   Lung cancer ([place])   COPD with acute exacerbation (HCC)   Pleural effusion on left   Acute on chronic respiratory failure with hypoxia ([place])   Palliative care encounter   Gastrointestinal hemorrhage   Body mass index is 17.61 kg/m.  (Underweight)    Acute on chronic hypoxic respiratory failure: She remains on oxygen via heated humidified HFNC (FiO2 35% at 45 L/min).  She is on 3 L/min oxygen at home. Taper down oxygen as able.  Left pleural effusion: S/p left-sided thoracentesis with removal of 900 cc of red-tinged fluid on 06/27/2020.  Pleural fluid is exudative but negative for malignancy.  Start empiric IV antibiotics pending pulmonary consult.  COPD with suspected exacerbation contributing to hypoxia: Continue steroids and bronchodilators.  Anemia of chronic disease, iron deficiency anemia, positive heme stools: s/p transfusion with 1 unit of PRBCs.  Hold Eliquis.  She has been evaluated by the gastroenterologist.  No plan for immediate endoscopic work-up.  Start ferrous sulfate.  Atrial fibrillation with RVR: Eliquis has been held.  Hypokalemia and hypomagnesemia: Improved  Hypophosphatemia: Replete phosphorus and monitor levels.  Right upper lobe lung cancer: Outpatient follow-up with oncologist.  History of stroke, hypothyroidism and depression.  Goals of care were discussed.  She wants to remain a full code for now.   Diet Order            Diet Heart Room service appropriate? Yes; Fluid consistency: Thin  Diet  effective now                    Consultants:  None  Procedures:  Left-sided thoracentesis with removal of 900 cc of fluid    Medications:   . escitalopram  20 mg Oral Daily  . [START ON 06/29/2020] ferrous sulfate  325 mg Oral QODAY  . fluticasone furoate-vilanterol  1 puff Inhalation Daily  . ipratropium-albuterol  3 mL Nebulization Q6H WA  . levothyroxine  75 mcg Oral QAC breakfast  . megestrol  625 mg Oral Daily  . methylPREDNISolone (SOLU-MEDROL) injection  40 mg Intravenous Once  . montelukast  10 mg Oral BH-q7a  . phosphorus  500 mg Oral TID  . [START ON 06/29/2020] predniSONE  40 mg Oral Q breakfast  . roflumilast  250 mcg Oral Daily  . umeclidinium bromide  1 puff Inhalation Daily   Continuous Infusions: . cefTRIAXone (ROCEPHIN)  IV    . pantoprozole (PROTONIX) infusion 8 mg/hr (06/27/20 2017)     Anti-infectives (From admission, onward)   Start     Dose/Rate Route Frequency Ordered Stop   06/28/20 1545  cefTRIAXone (ROCEPHIN) 1 g in sodium chloride 0.9 % 100 mL IVPB        1 g 200 mL/hr over 30 Minutes Intravenous Every 24 hours 06/28/20 1450               Family Communication/Anticipated D/C date and plan/Code Status   DVT prophylaxis: Place TED hose Start: 06/26/20 2010     Code  Status: Full Code  Family Communication: With husband at bedside Disposition Plan:    Status is: Inpatient  Remains inpatient appropriate because:Inpatient level of care appropriate due to severity of illness   Dispo: The patient is from: Home              Anticipated d/c is to: Home              Anticipated d/c date is: 2 days              Patient currently is not medically stable to d/c.   Difficult to place patient No              Subjective:   Interval events noted. c/o shortness of breath with minimal exertion.  Objective:    Vitals:   06/27/20 2102 06/28/20 0400 06/28/20 0500 06/28/20 0856  BP: 112/63 124/63  135/75  Pulse: 100  85  (!) 103  Resp: 17 18    Temp: 98.2 F (36.8 C) 98.2 F (36.8 C)  (!) 97.4 F (36.3 C)  TempSrc: Oral Oral  Oral  SpO2: 94% 93%  95%  Weight:   45.1 kg   Height:       No data found.   Intake/Output Summary (Last 24 hours) at 06/28/2020 1450 Last data filed at 06/28/2020 1330 Gross per 24 hour  Intake 480 ml  Output 300 ml  Net 180 ml   Filed Weights   06/26/20 1627 06/28/20 0500  Weight: 45.4 kg 45.1 kg    Exam:  GEN: NAD SKIN: Warm and dry.  Multiple bruises on bilateral upper extremities. EYES: No pallor or icterus ENT: MMM CV: RRR PULM: Bibasilar rales.  No wheezing. ABD: soft, ND, NT, +BS CNS: AAO x 3, non focal EXT: No edema or tenderness      Data Reviewed:   I have personally reviewed following labs and imaging studies:  Labs: Labs show the following:   Basic Metabolic Panel: Recent Labs  Lab 06/26/20 1634 06/27/20 0322 06/28/20 0538  NA 140 143 139  K 3.3* 3.9 4.0  CL 99 101 99  CO2 29 30 30   GLUCOSE 135* 157* 111*  BUN 19 25* 27*  CREATININE 0.55 0.82 0.72  CALCIUM 9.5 9.4 9.0  MG  --  1.6* 2.0  PHOS  --   --  1.9*   GFR Estimated Creatinine Clearance: 41.3 mL/min (by C-G formula based on SCr of 0.72 mg/dL). Liver Function Tests: Recent Labs  Lab 06/26/20 1634  AST 20  ALT 8  ALKPHOS 32*  BILITOT 0.5  PROT 6.1*  ALBUMIN 2.4*   No results for input(s): LIPASE, AMYLASE in the last 168 hours. No results for input(s): AMMONIA in the last 168 hours. Coagulation profile No results for input(s): INR, PROTIME in the last 168 hours.  CBC: Recent Labs  Lab 06/26/20 1634 06/27/20 0322 06/28/20 0538  WBC 15.7* 12.5* 8.6  NEUTROABS  --   --  7.9*  HGB 7.7* 8.2* 8.5*  HCT 26.1* 26.8* 27.4*  MCV 94.2 92.4 90.4  PLT 443* 379 356   Cardiac Enzymes: No results for input(s): CKTOTAL, CKMB, CKMBINDEX, TROPONINI in the last 168 hours. BNP (last 3 results) No results for input(s): PROBNP in the last 8760 hours. CBG: No results  for input(s): GLUCAP in the last 168 hours. D-Dimer: No results for input(s): DDIMER in the last 72 hours. Hgb A1c: No results for input(s): HGBA1C in the last 72 hours. Lipid Profile: No  results for input(s): CHOL, HDL, LDLCALC, TRIG, CHOLHDL, LDLDIRECT in the last 72 hours. Thyroid function studies: No results for input(s): TSH, T4TOTAL, T3FREE, THYROIDAB in the last 72 hours.  Invalid input(s): FREET3 Anemia work up: Recent Labs    06/27/20 0238 06/27/20 1302  VITAMINB12  --  1,855*  FOLATE 6.7  --   FERRITIN 75  --   TIBC 263  --   IRON 11*  --    Sepsis Labs: Recent Labs  Lab 06/26/20 1634 06/27/20 0322 06/28/20 0538  WBC 15.7* 12.5* 8.6    Microbiology Recent Results (from the past 240 hour(s))  SARS Coronavirus 2 by RT PCR (hospital order, performed in [facility] hospital lab) Nasopharyngeal Nasopharyngeal Swab     Status: None   Collection Time: 06/26/20  4:29 PM   Specimen: Nasopharyngeal Swab  Result Value Ref Range Status   SARS Coronavirus 2 NEGATIVE NEGATIVE Final    Comment: (NOTE) SARS-CoV-2 target nucleic acids are NOT DETECTED.  The SARS-CoV-2 RNA is generally detectable in upper and lower respiratory specimens during the acute phase of infection. The lowest concentration of SARS-CoV-2 viral copies this assay can detect is 250 copies / mL. A negative result does not preclude SARS-CoV-2 infection and should not be used as the sole basis for treatment or other patient management decisions.  A negative result may occur with improper specimen collection / handling, submission of specimen other than nasopharyngeal swab, presence of viral mutation(s) within the areas targeted by this assay, and inadequate number of viral copies (<250 copies / mL). A negative result must be combined with clinical observations, patient history, and epidemiological information.  Fact Sheet for Patients:   StrictlyIdeas.no  Fact Sheet for  Healthcare Providers: BankingDealers.co.za  This test is not yet approved or  cleared by the [place] FDA and has been authorized for detection and/or diagnosis of SARS-CoV-2 by FDA under an Emergency Use Authorization (EUA).  This EUA will remain in effect (meaning this test can be used) for the duration of the COVID-19 declaration under Section 564(b)(1) of the Act, 21 U.S.C. section 360bbb-3(b)(1), unless the authorization is terminated or revoked sooner.  Performed at [facility], [address] [place], [place] [address]   Body fluid culture     Status: None (Preliminary result)   Collection Time: 06/27/20 11:38 AM   Specimen: PATH Cytology Pleural fluid  Result Value Ref Range Status   Specimen Description   Final    PLEURAL Performed at [facility], [place], [address] [place], [place] [address]    Special Requests   Final    NONE Performed at [facility], [address] [place], [place] [address]    Gram Stain   Final    MODERATE WBC PRESENT, PREDOMINANTLY PMN NO ORGANISMS SEEN    Culture   Final    NO GROWTH < 24 HOURS Performed at [facility], [place] [address], [place],  [address]    Report Status PENDING  Incomplete    Procedures and diagnostic studies:  DG Chest Port 1 View  Result Date: 06/27/2020 CLINICAL DATA:  Status post left thoracentesis. EXAM: PORTABLE CHEST 1 VIEW COMPARISON:  Chest radiograph of June 26, 2020 FINDINGS: The heart size and mediastinal contours are unchanged. Decreased size of the now small left pleural effusion. Decreased left basilar atelectasis. No visible pneumothorax. Chronic changes of COPD. The visualized skeletal structures are unchanged. IMPRESSION: Decreased size of the now small left pleural effusion post  thoracentesis. No visible pneumothorax. Electronically Signed   By: Dahlia Bailiff MD   On: 06/27/2020 11:40   DG Chest Portable 1 View  Result Date:  06/26/2020 CLINICAL DATA:  Shortness of breath EXAM: PORTABLE CHEST 1 VIEW COMPARISON:  06/05/2020 FINDINGS: Increased left pleural effusion and left lung atelectasis. Background changes COPD. No pneumothorax. Stable cardiomediastinal contours. IMPRESSION: Increased left pleural effusion and left lung atelectasis. Electronically Signed   By: Macy Mis M.D.   On: 06/26/2020 16:55   US THORACENTESIS ASP PLEURAL SPACE W/IMG GUIDE  Result Date: 06/27/2020 INDICATION: Symptomatic left sided pleural effusion. Please perform ultrasound-guided thoracentesis for diagnostic and therapeutic purposes. EXAM: US THORACENTESIS ASP PLEURAL SPACE W/IMG GUIDE COMPARISON:  Chest radiograph-06/26/2020; chest CT-05/30/2020 MEDICATIONS: None. COMPLICATIONS: None immediate. TECHNIQUE: Informed written consent was obtained from the patient after a discussion of the risks, benefits and alternatives to treatment. A timeout was performed prior to the initiation of the procedure. Initial ultrasound scanning demonstrates a moderate to large sized predominantly anechoic left-sided pleural effusion. The lower chest was prepped and draped in the usual sterile fashion. 1% lidocaine was used for local anesthesia. An ultrasound image was saved for documentation purposes. An 8 Fr Safe-T-Centesis catheter was introduced. The thoracentesis was performed. The catheter was removed and a dressing was applied. The patient tolerated the procedure well without immediate post procedural complication. The patient was escorted to have an upright chest radiograph. FINDINGS: A total of approximately 900 cc of slightly blood tinged serous fluid was removed. Requested samples were sent to the laboratory. IMPRESSION: Successful ultrasound-guided left sided thoracentesis yielding 900 cc of pleural fluid. Electronically Signed   By: Sandi Mariscal M.D.   On: 06/27/2020 12:56               LOS: 1 day   Christine Morris  [facility]   Pager on  [email or web address]. If 7PM-7AM, please contact night-coverage at [email or web address]     06/28/2020, 2:50 PM

## 2020-06-28 NOTE — Consult Note (Signed)
Hematology/Oncology Consult note [facility] [phone] [phone]  Patient Care Team: Chase Picket, MD as PCP - General (Family Medicine) Telford Nab, RN as Oncology Nurse Navigator   Name of the patient: Christine Morris  [id number]  1941/02/14    Reason for consult: New diagnosis of small cell lung cancer   Requesting physician: Dr. Jennye Boroughs  Date of visit: 06/28/2020    History of presenting illness- Patient is a 80 year old female with past medical history significant for chronic hypoxic respiratory failure secondary to advanced COPD for which she is on home oxygen 3 to 5 L.  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  She was hospitalized in early January 2022 and I had seen her at that time as an inpatient.  My plan was to follow-up with her as an outpatient and get a PET scan to determine the extent of disease.  Also with her poor performance status it was unclear if patient would be able to tolerate chemotherapy and plans for either chemo or radiation were to be made as an outpatient.  However patient readmitted again with progressive worsening shortness of breath.  She was found to have moderate to large left pleural effusion and underwent thoracentesis yesterday.  Cytology is pending  Presently patient is on 40 L of oxygen which is a significant worsening from her prior hospitalization when she was on 4 to 5 L  ECOG PS- 4  Pain scale- 0   Review of systems- Review of Systems  Constitutional: Positive for malaise/fatigue and weight loss. Negative for chills and fever.  HENT: Negative for congestion, ear discharge and nosebleeds.   Eyes: Negative for blurred vision.  Respiratory: Positive for shortness of breath. Negative for cough, hemoptysis, sputum production and  wheezing.   Cardiovascular: Negative for chest pain, palpitations, orthopnea and claudication.  Gastrointestinal: Negative for abdominal pain, blood in stool, constipation, diarrhea, heartburn, melena, nausea and vomiting.  Genitourinary: Negative for dysuria, flank pain, frequency, hematuria and urgency.  Musculoskeletal: Negative for back pain, joint pain and myalgias.  Skin: Negative for rash.  Neurological: Negative for dizziness, tingling, focal weakness, seizures, weakness and headaches.  Endo/Heme/Allergies: Does not bruise/bleed easily.  Psychiatric/Behavioral: Negative for depression and suicidal ideas. The patient does not have insomnia.     Allergies  Allergen Reactions  . Rivaroxaban Hives    Patient Active Problem List   Diagnosis Date Noted  . Gastrointestinal hemorrhage   . Acute on chronic respiratory failure with hypoxia ([place]) 06/27/2020  . Palliative care encounter   . Pleural effusion on left 06/26/2020  . Pressure injury of skin 06/09/2020  . COPD (chronic obstructive pulmonary disease) with acute bronchitis ([place]) 06/02/2020  . Acute on chronic respiratory failure ([place]) 06/02/2020  . Lung cancer ([place]) 06/02/2020  . COPD with acute exacerbation ([place]) 06/02/2020  . Syncope 12/16/2019  . AF (paroxysmal atrial fibrillation) ([place]) 12/16/2019  . COPD with chronic bronchitis ([place]) 12/16/2019  . Chronic respiratory failure with hypoxia ([place]) 12/16/2019  . Sepsis ([place]) 05/31/2018  . TIA (transient ischemic attack) 10/11/2017  . CVA (cerebral vascular accident) ([place]) 10/11/2017  . Anxiety and depression 05/08/2015     Past Medical History:  Diagnosis Date  . Afib ([place])   . Anxiety   . Atrial fibrillation ([place]) 2017  . Cervical stenosis of spine   .  COPD (chronic obstructive pulmonary disease) ([place])   . Depression   . Hyperlipidemia   . Hypothyroidism   . Nodule of right lung   . On supplemental oxygen by nasal cannula   . 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 Rupture of extensor tendon of finger   . Spondylosis of cervical region without myelopathy or radiculopathy   . Stroke ([place])   . Vertigo   . Wartenberg syndrome      Past Surgical History:  Procedure Laterality Date  . ABDOMINAL HYSTERECTOMY    . APPENDECTOMY    . BREAST BIOPSY Left 1971  . BREAST CYST EXCISION Left 1971  . CATARACT SURGERY Bilateral   . LOOP RECORDER IMPLANT    . OOPHORECTOMY Bilateral   . VIDEO BRONCHOSCOPY WITH ENDOBRONCHIAL NAVIGATION N/A 05/30/2020   Procedure: VIDEO BRONCHOSCOPY WITH ENDOBRONCHIAL NAVIGATION;  Surgeon: Ottie Glazier, MD;  Location: [facility] ORS;  Service: Thoracic;  Laterality: N/A;  . VIDEO BRONCHOSCOPY WITH ENDOBRONCHIAL ULTRASOUND N/A 05/30/2020   Procedure: VIDEO BRONCHOSCOPY WITH ENDOBRONCHIAL ULTRASOUND;  Surgeon: Ottie Glazier, MD;  Location: [facility] ORS;  Service: Thoracic;  Laterality: N/A;    Social History   Socioeconomic History  . Marital status: Divorced    Spouse name: Not on file  . Number of children: Not on file  . Years of education: Not on file  . Highest education level: Not on file  Occupational History  . Not on file  Tobacco Use  . Smoking status: Former Research scientist (life sciences)  . Smokeless tobacco: Never Used  Vaping Use  . Vaping Use: Never used  Substance and Sexual Activity  . Alcohol use: Never  . Drug use: Never  . Sexual activity: Not on file  Other Topics Concern  . Not on file  Social History Narrative  . Not on file   Social Determinants of Health   Financial Resource Strain: Not on file  Food Insecurity: Not on file  Transportation Needs: Not on file  Physical Activity: Not on file  Stress: Not on file  Social Connections: Not on file  Intimate Partner Violence: Not on file     History reviewed. No pertinent family history.   Current Facility-Administered Medications:  .  acetaminophen (TYLENOL) tablet 325 mg, 325 mg, Oral, Q6H PRN **OR** acetaminophen (TYLENOL) suppository 325 mg, 325 mg,  Rectal, Q6H PRN, Cox, Amy N, DO .  cefTRIAXone (ROCEPHIN) 1 g in sodium chloride 0.9 % 100 mL IVPB, 1 g, Intravenous, Q24H, Jennye Boroughs, MD, Last Rate: 200 mL/hr at 06/28/20 1555, 1 g at 06/28/20 1555 .  escitalopram (LEXAPRO) tablet 20 mg, 20 mg, Oral, Daily, Cox, Amy N, DO, 20 mg at 06/28/20 0926 .  [START ON 06/29/2020] ferrous sulfate tablet 325 mg, 325 mg, Oral, QODAY, Jennye Boroughs, MD .  fluticasone furoate-vilanterol (BREO ELLIPTA) 200-25 MCG/INH 1 puff, 1 puff, Inhalation, Daily, Benita Gutter, RPH, 1 puff at 06/28/20 1023 .  ipratropium-albuterol (DUONEB) 0.5-2.5 (3) MG/3ML nebulizer solution 3 mL, 3 mL, Nebulization, Q6H WA, Cox, Amy N, DO, 3 mL at 06/28/20 1339 .  levothyroxine (SYNTHROID) tablet 75 mcg, 75 mcg, Oral, QAC breakfast, Cox, Amy N, DO, 75 mcg at 06/28/20 0539 .  megestrol (MEGACE) 400 MG/10ML suspension 625 mg, 625 mg, Oral, Daily, Jennye Boroughs, MD, 625 mg at 06/28/20 6122 .  metoprolol tartrate (LOPRESSOR) injection 5 mg, 5 mg, Intravenous, Q2H PRN, Cox, Amy N, DO .  montelukast (SINGULAIR) tablet 10 mg, 10 mg, Oral, BH-q7a, Cox, Amy N,  DO, 10 mg at 06/28/20 0927 .  ondansetron (ZOFRAN) tablet 4 mg, 4 mg, Oral, Q6H PRN **OR** ondansetron (ZOFRAN) injection 4 mg, 4 mg, Intravenous, Q6H PRN, Cox, Amy N, DO .  pantoprazole (PROTONIX) 80 mg in sodium chloride 0.9 % 100 mL (0.8 mg/mL) infusion, 8 mg/hr, Intravenous, Continuous, Cox, Amy N, DO, Last Rate: 10 mL/hr at 06/28/20 1518, 8 mg/hr at 06/28/20 1518 .  phosphorus (K PHOS NEUTRAL) tablet 500 mg, 500 mg, Oral, TID, Jennye Boroughs, MD, 500 mg at 06/28/20 1515 .  [START ON 06/29/2020] predniSONE (DELTASONE) tablet 40 mg, 40 mg, Oral, Q breakfast, Jennye Boroughs, MD .  roflumilast (DALIRESP) tablet 250 mcg, 250 mcg, Oral, Daily, Cox, Amy N, DO, 250 mcg at 06/28/20 0926 .  umeclidinium bromide (INCRUSE ELLIPTA) 62.5 MCG/INH 1 puff, 1 puff, Inhalation, Daily, Benita Gutter, RPH, 1 puff at 06/28/20 1024   Physical exam:   Vitals:   06/28/20 0500 06/28/20 0856 06/28/20 1341 06/28/20 1603  BP:  135/75 140/77 133/78  Pulse:  (!) 103 100 95  Resp:    19  Temp:  (!) 97.4 F (36.3 C) 97.9 F (36.6 C) 98.3 F (36.8 C)  TempSrc:  Oral Oral   SpO2:  95%  97%  Weight: 99 lb 6.8 oz (45.1 kg)     Height:       Physical Exam Constitutional:      Comments: Thin elderly frail woman who appears in acute respiratory distress.  She is on 40 L of oxygen  Eyes:     Extraocular Movements: EOM normal.  Cardiovascular:     Rate and Rhythm: Regular rhythm. Tachycardia present.     Heart sounds: Normal heart sounds.  Pulmonary:     Comments: Effort of breathing increased and breath sounds diminished bilaterally diffusely Abdominal:     General: Bowel sounds are normal.     Palpations: Abdomen is soft.  Skin:    General: Skin is warm and dry.  Neurological:     Mental Status: She is alert and oriented to person, place, and time.        CMP Latest Ref Rng & Units 06/28/2020  Glucose 70 - 99 mg/dL 111(H)  BUN 8 - 23 mg/dL 27(H)  Creatinine 0.44 - 1.00 mg/dL 0.72  Sodium 135 - 145 mmol/L 139  Potassium 3.5 - 5.1 mmol/L 4.0  Chloride 98 - 111 mmol/L 99  CO2 22 - 32 mmol/L 30  Calcium 8.9 - 10.3 mg/dL 9.0  Total Protein 6.5 - 8.1 g/dL -  Total Bilirubin 0.3 - 1.2 mg/dL -  Alkaline Phos 38 - 126 U/L -  AST 15 - 41 U/L -  ALT 0 - 44 U/L -   CBC Latest Ref Rng & Units 06/28/2020  WBC 4.0 - 10.5 K/uL 8.6  Hemoglobin 12.0 - 15.0 g/dL 8.5(L)  Hematocrit 36.0 - 46.0 % 27.4(L)  Platelets 150 - 400 K/uL 356    _0 @  DG Chest 1 View  Result Date: 06/02/2020 CLINICAL DATA:  Shortness of breath, right lobe biopsy performed Wednesday EXAM: CHEST  1 VIEW COMPARISON:  Radiograph 12/16/2019, CT 05/30/2020 FINDINGS: There is persistent atelectasis and/or consolidation in the left lung base with a left pleural effusion as was seen on comparison CT imaging 3 days prior. Some additional bronchitic and bronchiectatic  changes are seen diffusely throughout the lungs with some chronically coarsened interstitial changes and reticulonodular opacities throughout the lungs in a similar distribution to more remote comparison. No new consolidative  opacity. No pneumothorax. No convincing CT features of edema at this time. The aorta is calcified. The remaining cardiomediastinal contours are unremarkable. No acute osseous or soft tissue abnormality. Degenerative changes are present in the imaged spine and shoulders. Telemetry leads and support devices overlie the chest. IMPRESSION: 1. Persistent atelectasis and/or consolidation in the left lung base with a left pleural effusion as was seen on comparison CT imaging. 2. Some chronic bronchitic/bronchiectatic changes with coarsened interstitium and diffuse reticulonodular opacities conspicuous for a long-standing/chronic atypical infection including mycobacterial etiologies. 3. No other acute cardiopulmonary abnormalities. 4. The aorta is calcified. The remaining cardiomediastinal contours are unremarkable. Electronically Signed   By: Lovena Le M.D.   On: 06/02/2020 05:39   CT CHEST WO CONTRAST  Result Date: 05/30/2020 CLINICAL DATA:  Shortness of breath.  Preop for bronchoscopy. EXAM: CT CHEST WITHOUT CONTRAST TECHNIQUE: Multidetector CT imaging of the chest was performed following the standard protocol without IV contrast. COMPARISON:  May 03, 2020. FINDINGS: Cardiovascular: Atherosclerosis of thoracic aorta is noted without aneurysm formation. Normal cardiac size. Small pericardial effusion is noted. Coronary artery calcifications are noted. Mediastinum/Nodes: As noted on prior exam, there is extensive mediastinal adenopathy present particularly in the right paratracheal pretracheal subcarinal and left hilar regions. This results in narrowing of the left mainstem bronchus. Thyroid gland is unremarkable. Esophagus is unremarkable. Lungs/Pleura: No pneumothorax is noted. Mild  biapical scarring is noted. Mild to moderate left pleural effusion is noted with adjacent left lower lobe postobstructive atelectasis or infiltrate. Upper Abdomen: No acute abnormality. Musculoskeletal: No chest wall mass or suspicious bone lesions identified. IMPRESSION: 1. Mild to moderate left pleural effusion is noted with adjacent left lower lobe postobstructive atelectasis or infiltrate. 2. Small pericardial effusion is noted. 3. Coronary artery calcifications are noted suggesting coronary artery disease. 4. As noted on prior exam, there is extensive mediastinal adenopathy present particularly in the right paratracheal, subcarinal and left hilar regions. This results in narrowing of the left mainstem bronchus. This is concerning for malignancy or metastatic disease. Aortic Atherosclerosis (ICD10-I70.0). Electronically Signed   By: Marijo Conception M.D.   On: 05/30/2020 09:32   MR BRAIN W WO CONTRAST  Result Date: 06/05/2020 CLINICAL DATA:  Small-cell lung cancer, staging EXAM: MRI HEAD WITHOUT AND WITH CONTRAST TECHNIQUE: Multiplanar, multiecho pulse sequences of the brain and surrounding structures were obtained without and with intravenous contrast. CONTRAST:  24m GADAVIST GADOBUTROL 1 MMOL/ML IV SOLN COMPARISON:  May 2019 FINDINGS: Motion artifact is present. Findings below are within this limitation. Brain: There is no acute infarction or intracranial hemorrhage. There is no intracranial mass, mass effect, or edema. There is no hydrocephalus or extra-axial fluid collection. Prominence of the ventricles and sulci reflects generalized parenchymal volume loss. Small chronic infarcts of the right frontal lobe cortex and caudate. Additional patchy and confluent areas of T2 hyperintensity in the supratentorial and pontine white matter are nonspecific but probably reflect moderate chronic microvascular ischemic changes. Appearance is similar to the prior study. No abnormal enhancement. Vascular: Major vessel  flow voids at the skull base are preserved. Skull and upper cervical spine: Normal marrow signal is preserved. Sinuses/Orbits: Paranasal sinuses are aerated. Orbits are unremarkable. Other: Sella is unremarkable.  Mastoid air cells are clear. IMPRESSION: Suboptimal evaluation due to motion artifact. No evidence of intracranial metastatic disease. Small chronic infarcts and moderate chronic microvascular ischemic changes. Electronically Signed   By: PMacy MisM.D.   On: 06/05/2020 11:39   [place] Img Lower Bilateral (DVT)  Result Date: 06/03/2020 CLINICAL DATA:  Shortness of breath EXAM: BILATERAL LOWER EXTREMITY VENOUS DOPPLER ULTRASOUND TECHNIQUE: Gray-scale sonography with compression, as well as color and duplex ultrasound, were performed to evaluate the deep venous system(s) from the level of the common femoral vein through the popliteal and proximal calf veins. COMPARISON:  None. FINDINGS: VENOUS Normal compressibility of the common femoral, superficial femoral, and popliteal veins, as well as the visualized calf veins. Visualized portions of profunda femoral vein and great saphenous vein unremarkable. No filling defects to suggest DVT on grayscale or color Doppler imaging. Doppler waveforms show normal direction of venous flow, normal respiratory plasticity and response to augmentation. Limited views of the contralateral common femoral vein are unremarkable. OTHER None. Limitations: none IMPRESSION: No acute deep vein thrombosis in the visualized bilateral lower extremities. Electronically Signed   By: Valentino Saxon MD   On: 06/03/2020 13:20   DG Chest Port 1 View  Result Date: 06/27/2020 CLINICAL DATA:  Status post left thoracentesis. EXAM: PORTABLE CHEST 1 VIEW COMPARISON:  Chest radiograph of June 26, 2020 FINDINGS: The heart size and mediastinal contours are unchanged. Decreased size of the now small left pleural effusion. Decreased left basilar atelectasis. No visible pneumothorax.  Chronic changes of COPD. The visualized skeletal structures are unchanged. IMPRESSION: Decreased size of the now small left pleural effusion post thoracentesis. No visible pneumothorax. Electronically Signed   By: Dahlia Bailiff MD   On: 06/27/2020 11:40   DG Chest Portable 1 View  Result Date: 06/26/2020 CLINICAL DATA:  Shortness of breath EXAM: PORTABLE CHEST 1 VIEW COMPARISON:  06/05/2020 FINDINGS: Increased left pleural effusion and left lung atelectasis. Background changes COPD. No pneumothorax. Stable cardiomediastinal contours. IMPRESSION: Increased left pleural effusion and left lung atelectasis. Electronically Signed   By: Macy Mis M.D.   On: 06/26/2020 16:55   DG Chest Port 1 View  Result Date: 06/05/2020 CLINICAL DATA:  Shortness of breath EXAM: PORTABLE CHEST 1 VIEW COMPARISON:  06/02/2020 FINDINGS: Mild blunting of the left costophrenic angle is unchanged. Mild chronic bronchitic type changes. Calcific aortic atherosclerosis. There is left retrocardiac consolidation/atelectasis, unchanged. IMPRESSION: Unchanged left retrocardiac consolidation/atelectasis. Electronically Signed   By: Ulyses Jarred M.D.   On: 06/05/2020 02:30   US THORACENTESIS ASP PLEURAL SPACE W/IMG GUIDE  Result Date: 06/27/2020 INDICATION: Symptomatic left sided pleural effusion. Please perform ultrasound-guided thoracentesis for diagnostic and therapeutic purposes. EXAM: US THORACENTESIS ASP PLEURAL SPACE W/IMG GUIDE COMPARISON:  Chest radiograph-06/26/2020; chest CT-05/30/2020 MEDICATIONS: None. COMPLICATIONS: None immediate. TECHNIQUE: Informed written consent was obtained from the patient after a discussion of the risks, benefits and alternatives to treatment. A timeout was performed prior to the initiation of the procedure. Initial ultrasound scanning demonstrates a moderate to large sized predominantly anechoic left-sided pleural effusion. The lower chest was prepped and draped in the usual sterile fashion. 1%  lidocaine was used for local anesthesia. An ultrasound image was saved for documentation purposes. An 8 Fr Safe-T-Centesis catheter was introduced. The thoracentesis was performed. The catheter was removed and a dressing was applied. The patient tolerated the procedure well without immediate post procedural complication. The patient was escorted to have an upright chest radiograph. FINDINGS: A total of approximately 900 cc of slightly blood tinged serous fluid was removed. Requested samples were sent to the laboratory. IMPRESSION: Successful ultrasound-guided left sided thoracentesis yielding 900 cc of pleural fluid. Electronically Signed   By: Sandi Mariscal M.D.   On: 06/27/2020 12:56    Assessment and plan- Patient is a 80  y.o. female with newly diagnosed small cell lung cancer admitted for acute on chronic hypoxic respiratory failure  Patient's performance status is poor and it is unlikely that she would be able to tolerate systemic chemotherapy as an outpatient.  I didl get in touch with radiation oncology and they will assess the patient tomorrow to decide if the patient would be a candidate for outpatient palliative radiation.  Patient's performance status is poor and she is not a candidate for systemic chemotherapy   Given that she has extensive mediastinal adenopathy which is partly contributing to her shortness of breath.  Her baseline shortness of breath from advanced COPD remains a significant factor for her dyspnea as well.  Patient was found to have worsening left pleural effusion which was drained and was blood-tinged and overall concerning for malignancy.  If cytology is positive for malignancy this would be stage IV disease.  I would recommend getting a CT abdomen and pelvis with contrast while she is inpatient to determine if she has metastatic disease.  She did have MRI brain last month which did not show any evidence of metastatic disease.  Patient is also met with palliative care and I spoke  to her as well as her husband today.  Patient is leaning towards a supportive approach at this time.  She understands that she cannot go through chemotherapy at this time and radiation will be a big undertaking for her to come back and forth every day for 6 to 7 weeks.  Presently she is medically unstable on 40 L of oxygen.  I also discussed her CODE STATUS and she is leaning towards a DNR/DNI but would like to discuss with her daughters in more detail and get back to [place]   Visit Diagnosis 1. COPD exacerbation ([place])   2. Gastrointestinal hemorrhage, unspecified gastrointestinal hemorrhage type   3. Shortness of breath   4. Pleural effusion   5. S/P thoracentesis     Dr. Randa Evens, MD, MPH [facility] [phone] 06/28/2020

## 2020-06-29 ENCOUNTER — Inpatient Hospital Stay (HOSPITAL_COMMUNITY)
Admit: 2020-06-29 | Discharge: 2020-06-29 | Disposition: A | Discharge status: Home / Self Care | Payer: Medicare Other | Attending: Pulmonary Disease | Admitting: Pulmonary Disease

## 2020-06-29 ENCOUNTER — Ambulatory Visit: Payer: Medicare Other | Attending: Radiation Oncology | Admitting: Radiation Oncology

## 2020-06-29 DIAGNOSIS — Z515 Encounter for palliative care: Secondary | ICD-10-CM | POA: Diagnosis not present

## 2020-06-29 DIAGNOSIS — J90 Pleural effusion, not elsewhere classified: Secondary | ICD-10-CM | POA: Diagnosis not present

## 2020-06-29 DIAGNOSIS — C3490 Malignant neoplasm of unspecified part of unspecified bronchus or lung: Secondary | ICD-10-CM

## 2020-06-29 DIAGNOSIS — C3492 Malignant neoplasm of unspecified part of left bronchus or lung: Secondary | ICD-10-CM | POA: Diagnosis not present

## 2020-06-29 DIAGNOSIS — E43 Unspecified severe protein-calorie malnutrition: Secondary | ICD-10-CM

## 2020-06-29 DIAGNOSIS — J441 Chronic obstructive pulmonary disease with (acute) exacerbation: Secondary | ICD-10-CM | POA: Diagnosis not present

## 2020-06-29 DIAGNOSIS — I5021 Acute systolic (congestive) heart failure: Secondary | ICD-10-CM

## 2020-06-29 DIAGNOSIS — J9621 Acute and chronic respiratory failure with hypoxia: Secondary | ICD-10-CM | POA: Diagnosis not present

## 2020-06-29 DIAGNOSIS — F419 Anxiety disorder, unspecified: Secondary | ICD-10-CM | POA: Diagnosis not present

## 2020-06-29 DIAGNOSIS — Z7189 Other specified counseling: Secondary | ICD-10-CM

## 2020-06-29 LAB — CBC WITH DIFFERENTIAL/PLATELET
Abs Immature Granulocytes: 0.06 10*3/uL (ref 0.00–0.07)
Basophils Absolute: 0 10*3/uL (ref 0.0–0.1)
Basophils Relative: 0 %
Eosinophils Absolute: 0 10*3/uL (ref 0.0–0.5)
Eosinophils Relative: 0 %
HCT: 27 % — ABNORMAL LOW (ref 36.0–46.0)
Hemoglobin: 8.4 g/dL — ABNORMAL LOW (ref 12.0–15.0)
Immature Granulocytes: 1 %
Lymphocytes Relative: 5 %
Lymphs Abs: 0.5 10*3/uL — ABNORMAL LOW (ref 0.7–4.0)
MCH: 27.9 pg (ref 26.0–34.0)
MCHC: 31.1 g/dL (ref 30.0–36.0)
MCV: 89.7 fL (ref 80.0–100.0)
Monocytes Absolute: 0.4 10*3/uL (ref 0.1–1.0)
Monocytes Relative: 5 %
Neutro Abs: 7.7 10*3/uL (ref 1.7–7.7)
Neutrophils Relative %: 89 %
Platelets: 392 10*3/uL (ref 150–400)
RBC: 3.01 MIL/uL — ABNORMAL LOW (ref 3.87–5.11)
RDW: 15.6 % — ABNORMAL HIGH (ref 11.5–15.5)
WBC: 8.7 10*3/uL (ref 4.0–10.5)
nRBC: 0 % (ref 0.0–0.2)

## 2020-06-29 LAB — RESPIRATORY PANEL BY PCR

## 2020-06-29 LAB — MRSA PCR SCREENING: MRSA by PCR: NEGATIVE

## 2020-06-29 LAB — BRAIN NATRIURETIC PEPTIDE: B Natriuretic Peptide: 141.4 pg/mL — ABNORMAL HIGH (ref 0.0–100.0)

## 2020-06-29 LAB — PROCALCITONIN: Procalcitonin: 0.6 ng/mL

## 2020-06-29 LAB — PHOSPHORUS: Phosphorus: 2.8 mg/dL (ref 2.5–4.6)

## 2020-06-29 LAB — PH, BODY FLUID: pH, Body Fluid: 8.2

## 2020-06-29 MED ORDER — ENSURE ENLIVE PO LIQD
237.0000 mL | Freq: Two times a day (BID) | ORAL | Status: DC
  Administered 2020-06-29 – 2020-07-04 (×7): 237 mL via ORAL

## 2020-06-29 MED ORDER — ADULT MULTIVITAMIN W/MINERALS CH
1.0000 | ORAL_TABLET | Freq: Every day | ORAL | Status: DC
  Administered 2020-06-30 – 2020-07-04 (×5): 1 via ORAL
  Filled 2020-06-29 (×5): qty 1

## 2020-06-29 MED ORDER — ASCORBIC ACID 500 MG PO TABS
250.0000 mg | ORAL_TABLET | Freq: Two times a day (BID) | ORAL | Status: DC
  Administered 2020-06-29 – 2020-07-04 (×10): 250 mg via ORAL
  Filled 2020-06-29 (×10): qty 1

## 2020-06-29 MED ORDER — FUROSEMIDE 10 MG/ML IJ SOLN
20.0000 mg | Freq: Two times a day (BID) | INTRAMUSCULAR | Status: DC
  Administered 2020-06-29 – 2020-07-01 (×5): 20 mg via INTRAVENOUS
  Filled 2020-06-29 (×5): qty 2

## 2020-06-29 MED ORDER — MORPHINE SULFATE (CONCENTRATE) 10 MG/0.5ML PO SOLN
5.0000 mg | ORAL | Status: DC | PRN
  Administered 2020-06-30 – 2020-07-02 (×3): 10 mg via ORAL
  Filled 2020-06-29 (×3): qty 0.5

## 2020-06-29 NOTE — Progress Notes (Signed)
Nurse requested a chaplain visit family who will be consulting palliative care upon discharge. Family was bedside, patient awake and talkative. They were appreciative of visit. I assured family a chaplain would be available if and when needed.

## 2020-06-29 NOTE — Care Management Important Message (Signed)
Important Message  Patient Details  Name: KYRSTAN GOTWALT MRN: [id number] Date of Birth: 1941-02-05   Medicare Important Message Given:  N/A - LOS <3 / Initial given by admissions  Initial Medicare IM reviewed with patient by Meredith Mody, Patient Access Associate on 06/28/2020 at 11:51am.   Dannette Barbara 06/29/2020, 9:37 AM

## 2020-06-29 NOTE — Consult Note (Signed)
NEW PATIENT EVALUATION  Name: Christine Morris  MRN: [id number]  Date:   06/26/2020     DOB: 06/22/1940   This 80 y.o. 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 provider found  CHIEF COMPLAINT:  Chief Complaint  Patient presents with  . Respiratory Distress    DIAGNOSIS: The primary encounter diagnosis was COPD exacerbation ([place]). Diagnoses of Gastrointestinal hemorrhage, unspecified gastrointestinal hemorrhage type, Shortness of breath, Pleural effusion, and S/P thoracentesis were also pertinent to this visit.   PREVIOUS INVESTIGATIONS:  CT scans reviewed Pathology report reviewed Clinical notes reviewed  HPI: Patient is a 80 year old female seen in the hospital.  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  Back in August she was found to have a 2.7 cm hypermetabolic subcarinal adenopathy with a large subcarinal left hilar lymph nodes consistent with carcinoma.  She had EBUS in December showing small cell lung cancer.  She has been seen by Dr. Janese Banks who is been following the patient she has developed a moderate to large left pleural effusion and underwent thoracentesis.  Patient is currently hospitalized on 40 L of oxygen.  She has met with palliative care is now referred to [facility] for consideration of palliative treatment.  She is seen in her hospital bed with her husband present.  She is frail cachectic.  She is having no hemoptysis at this time. 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    PAST SURGICAL HISTORY:  Past Surgical History:  Procedure Laterality Date  . ABDOMINAL HYSTERECTOMY    . APPENDECTOMY    . BREAST BIOPSY Left 1971  . BREAST CYST EXCISION Left 1971  . CATARACT SURGERY Bilateral   . LOOP RECORDER IMPLANT    . OOPHORECTOMY Bilateral   . VIDEO BRONCHOSCOPY WITH ENDOBRONCHIAL NAVIGATION N/A 05/30/2020   Procedure: VIDEO BRONCHOSCOPY WITH ENDOBRONCHIAL NAVIGATION;  Surgeon: Ottie Glazier, MD;  Location: [facility] ORS;  Service: Thoracic;  Laterality: N/A;  . VIDEO BRONCHOSCOPY WITH ENDOBRONCHIAL ULTRASOUND N/A 05/30/2020   Procedure: VIDEO BRONCHOSCOPY WITH ENDOBRONCHIAL ULTRASOUND;  Surgeon: Ottie Glazier, MD;  Location: [facility] ORS;  Service: Thoracic;  Laterality: N/A;    FAMILY HISTORY: family history is not on file.  SOCIAL HISTORY:  reports that she has quit smoking. She has never used smokeless tobacco. She reports that she does not drink alcohol and does not use drugs.  ALLERGIES: Rivaroxaban  MEDICATIONS:  Current Facility-Administered Medications  Medication Dose Route Frequency Provider Last Rate Last Admin  . acetaminophen (TYLENOL) tablet 325 mg  325 mg Oral Q6H PRN Cox, Amy N, DO       Or  . acetaminophen (TYLENOL) suppository 325 mg  325 mg Rectal Q6H PRN Cox, Amy N, DO      . cefTRIAXone (ROCEPHIN) 1 g in sodium chloride 0.9 % 100 mL IVPB  1 g Intravenous Q24H Jennye Boroughs, MD 200 mL/hr at 06/28/20 1555 1 g at 06/28/20 1555  . escitalopram (LEXAPRO) tablet 20 mg  20  mg Oral Daily Cox, Amy N, DO   20 mg at 06/29/20 0919  . ferrous sulfate tablet 325 mg  325 mg Oral Tonye Pearson, MD   325 mg at 06/29/20 0919  . fluticasone furoate-vilanterol (BREO ELLIPTA) 200-25 MCG/INH 1 puff  1 puff Inhalation Daily Benita Gutter, RPH   1 puff at 06/29/20 1638  . furosemide (LASIX) injection 20  mg  20 mg Intravenous BID Ottie Glazier, MD   20 mg at 06/29/20 0927  . ipratropium-albuterol (DUONEB) 0.5-2.5 (3) MG/3ML nebulizer solution 3 mL  3 mL Nebulization Q6H WA Cox, Amy N, DO   3 mL at 06/29/20 0732  . levothyroxine (SYNTHROID) tablet 75 mcg  75 mcg Oral QAC breakfast Cox, Amy N, DO   75 mcg at 06/29/20 4665  . megestrol (MEGACE) 400 MG/10ML suspension 625 mg  625 mg Oral Daily Jennye Boroughs, MD   625 mg at 06/29/20 9935  . metoprolol tartrate (LOPRESSOR) injection 5 mg  5 mg Intravenous Q2H PRN Cox, Amy N, DO      . montelukast (SINGULAIR) tablet 10 mg  10 mg Oral BH-q7a Cox, Amy N, DO   10 mg at 06/29/20 7017  . morphine 2 MG/ML injection 2 mg  2 mg Intravenous Q2H PRN Athena Masse, MD      . ondansetron [facility]) tablet 4 mg  4 mg Oral Q6H PRN Cox, Amy N, DO       Or  . ondansetron (ZOFRAN) injection 4 mg  4 mg Intravenous Q6H PRN Cox, Amy N, DO   4 mg at 06/28/20 2215  . oxyCODONE (Oxy IR/ROXICODONE) immediate release tablet 5 mg  5 mg Oral Q4H PRN Athena Masse, MD   5 mg at 06/28/20 2107  . phosphorus (K PHOS NEUTRAL) tablet 500 mg  500 mg Oral TID Jennye Boroughs, MD   500 mg at 06/29/20 0920  . predniSONE (DELTASONE) tablet 40 mg  40 mg Oral Q breakfast Jennye Boroughs, MD   40 mg at 06/29/20 0920  . roflumilast (DALIRESP) tablet 250 mcg  250 mcg Oral Daily Cox, Amy N, DO   250 mcg at 06/29/20 0919  . sodium chloride flush (NS) 0.9 % injection 3 mL  3 mL Intravenous PRN Jennye Boroughs, MD      . umeclidinium bromide (INCRUSE ELLIPTA) 62.5 MCG/INH 1 puff  1 puff Inhalation Daily Benita Gutter, RPH   1 puff at 06/29/20 0923    ECOG PERFORMANCE STATUS:  3 - Symptomatic, >50% confined to bed  REVIEW OF SYSTEMS: Patient denies any weight loss, fatigue, weakness, fever, chills or night sweats. Patient denies any loss of vision, blurred vision. Patient denies any ringing  of the ears or hearing loss. No irregular heartbeat. Patient denies heart murmur or history of fainting.  Patient denies any chest pain or pain radiating to her upper extremities. Patient denies any shortness of breath, difficulty breathing at night, cough or hemoptysis. Patient denies any swelling in the lower legs. Patient denies any nausea vomiting, vomiting of blood, or coffee ground material in the vomitus. Patient denies any stomach pain. Patient states has had normal bowel movements no significant constipation or diarrhea. Patient denies any dysuria, hematuria or significant nocturia. Patient denies any problems walking, swelling in the joints or loss of balance. Patient denies any skin changes, loss of hair or loss of weight. Patient denies any excessive worrying or anxiety or significant depression. Patient denies any problems with insomnia. Patient denies excessive  thirst, polyuria, polydipsia. Patient denies any swollen glands, patient denies easy bruising or easy bleeding. Patient denies any recent infections, allergies or URI. Patient "s visual fields have not changed significantly in recent time.   PHYSICAL EXAM: BP (!) 111/56 (BP Location: Right Arm)   Pulse 67   Temp 98 F (36.7 C)   Resp 19   Ht '5\' 3"'  (1.6 m)   Wt 100 lb 0.7 oz (45.4 kg)   SpO2 94%   BMI 17.72 kg/m  Frail cachectic appearing female on significant oxygen in NAD.  She does have some decreased breath sounds bilaterally.  Well-developed well-nourished patient in NAD. HEENT reveals PERLA, EOMI, discs not visualized.  Oral cavity is clear. No oral mucosal lesions are identified. Neck is clear without evidence of cervical or supraclavicular adenopathy. Lungs are clear to A&P. Cardiac examination is essentially unremarkable with regular rate and rhythm without murmur rub or thrill. Abdomen is benign with no organomegaly or masses noted. Motor sensory and DTR levels are equal and symmetric in the upper and lower extremities. Cranial nerves II through XII are grossly intact. Proprioception is intact. No peripheral adenopathy or edema  is identified. No motor or sensory levels are noted. Crude visual fields are within normal range.  LABORATORY DATA: Cytology report from January of EBUS guided fine-needle aspiration diagnostic of metastatic small cell carcinoma    RADIOLOGY RESULTS: CT scan and PET CT scans reviewed compatible with above-stated findings   IMPRESSION: Not completely stage but at this time limited stage small cell lung cancer of the chest and frail 80 year old female on significant nasal oxygen  PLAN: At this time I discussed with the patient and her husband my recommendations would be for palliative care.  I would also obtain a short course of 30 Gray in 10 fractions to her chest to try to prevent any further collapse of her lungs possible hemoptysis and further chest tightness.  I have left the card with them and they are having a family meeting this afternoon which I will be happy to add any additional information at that time.  They are also in contact with Josh and palliative care should they decide to go the hospice route.  All parties seem to comprehend my recommendations well.  I would like to take this opportunity to thank you for allowing me to participate in the care of your patient.Noreene Filbert, MD

## 2020-06-29 NOTE — Progress Notes (Signed)
Pulmonary Medicine          Date: 06/29/2020,   MRN# [id number] LEXIS POTENZA [birth year]     AdmissionWeight: 45.4 kg                 CurrentWeight: 45.4 kg      CHIEF COMPLAINT:   Mediastinal mass with LLL infiltrate   HISTORY OF PRESENT ILLNESS    Ms. Foxworth is a pleasant 80 year old female with advanced COPD chronic hypoxemia with baseline 3 to 4 L/min supplemental oxygen at home she is on numerous respiratory medications for her chronic lung disease.  She is well-known to our service.  Recent medical events include diagnosis of small cell lung cancer and she has been referred to oncology with Dr. Janese Banks for this with possible therapy including radiation/chemo in the future.  In the interim she had developed acute on chronic hypoxemic respiratory failure and was found to have a moderate to large left pleural effusion. During interview and examination patient is on 36%/35L/min HFNC humidified.  On initial ultrasound scan pleural fluid appeared anechoic per radiology report however on fluid differential gross appearance was bloody red with neutrophil predominance and elevated LDH, this can be seen with trauma/procedure.  Cytology is negative and glucose is normal which points away from infection and malignancy as etiology.  Peripheral blood without leukocytosis also making pneumonia less likely.  She is being treated for community-acquired pneumonia and acute exacerbation of COPD empirically currently on Rocephin and systemic steroids with initial IV Solu-Medrol transition to p.o. prednisone at this time.  Repeat chest x-ray postthoracentesis does show reduction of initial left pleural fluid to now small sized effusion.  Additionally patient was found to have fecal occult blood stool positive and had GI evaluation however no worrisome findings were discovered and GI has signed off at this time.  She was also evaluated by palliative care services with plan to keep full scope of  therapy at this time and additional goals of care discussion with family and outpatient post discharge.  PCCM consulted due to acute on chronic hypoxemic respiratory failure with exudative left pleural effusion patient previously well-known and followed on outpatient.   PAST MEDICAL HISTORY   Past Medical History:  Diagnosis Date  . Afib ([place])   . Anxiety   . Atrial fibrillation ([place]) 2017  . Cervical stenosis of spine   . COPD (chronic obstructive pulmonary disease) ([place])   . Depression   . Hyperlipidemia   . Hypothyroidism   . Nodule of right lung   . On supplemental oxygen by nasal cannula   . 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 Rupture of extensor tendon of finger   . Spondylosis of cervical region without myelopathy or radiculopathy   . Stroke ([place])   . Vertigo   . Wartenberg syndrome      SURGICAL HISTORY   Past Surgical History:  Procedure Laterality Date  . ABDOMINAL HYSTERECTOMY    . APPENDECTOMY    . BREAST BIOPSY Left 1971  . BREAST CYST EXCISION Left 1971  . CATARACT SURGERY Bilateral   . LOOP RECORDER IMPLANT    . OOPHORECTOMY Bilateral   . VIDEO BRONCHOSCOPY WITH ENDOBRONCHIAL NAVIGATION N/A 05/30/2020   Procedure: VIDEO BRONCHOSCOPY WITH ENDOBRONCHIAL NAVIGATION;  Surgeon: Ottie Glazier, MD;  Location: [facility] ORS;  Service: Thoracic;  Laterality: N/A;  . VIDEO BRONCHOSCOPY WITH ENDOBRONCHIAL ULTRASOUND N/A 05/30/2020   Procedure: VIDEO BRONCHOSCOPY WITH ENDOBRONCHIAL ULTRASOUND;  Surgeon: Ottie Glazier,  MD;  Location: [facility] ORS;  Service: Thoracic;  Laterality: N/A;     FAMILY HISTORY   History reviewed. No pertinent family history.   SOCIAL HISTORY   Social History   Tobacco Use  . Smoking status: Former Research scientist (life sciences)  . Smokeless tobacco: Never Used  Vaping Use  . Vaping Use: Never used  Substance Use Topics  . Alcohol use: Never  . Drug use: Never     MEDICATIONS    Home Medication:    Current Medication:  Current  Facility-Administered Medications:  .  acetaminophen (TYLENOL) tablet 325 mg, 325 mg, Oral, Q6H PRN **OR** acetaminophen (TYLENOL) suppository 325 mg, 325 mg, Rectal, Q6H PRN, Cox, Amy N, DO .  cefTRIAXone (ROCEPHIN) 1 g in sodium chloride 0.9 % 100 mL IVPB, 1 g, Intravenous, Q24H, Jennye Boroughs, MD, Last Rate: 200 mL/hr at 06/28/20 1555, 1 g at 06/28/20 1555 .  escitalopram (LEXAPRO) tablet 20 mg, 20 mg, Oral, Daily, Cox, Amy N, DO, 20 mg at 06/28/20 0926 .  ferrous sulfate tablet 325 mg, 325 mg, Oral, QODAY, Jennye Boroughs, MD .  fluticasone furoate-vilanterol (BREO ELLIPTA) 200-25 MCG/INH 1 puff, 1 puff, Inhalation, Daily, Benita Gutter, RPH, 1 puff at 06/28/20 1023 .  ipratropium-albuterol (DUONEB) 0.5-2.5 (3) MG/3ML nebulizer solution 3 mL, 3 mL, Nebulization, Q6H WA, Cox, Amy N, DO, 3 mL at 06/29/20 0253 .  levothyroxine (SYNTHROID) tablet 75 mcg, 75 mcg, Oral, QAC breakfast, Cox, Amy N, DO, 75 mcg at 06/29/20 2841 .  megestrol (MEGACE) 400 MG/10ML suspension 625 mg, 625 mg, Oral, Daily, Jennye Boroughs, MD, 625 mg at 06/28/20 3244 .  metoprolol tartrate (LOPRESSOR) injection 5 mg, 5 mg, Intravenous, Q2H PRN, Cox, Amy N, DO .  montelukast (SINGULAIR) tablet 10 mg, 10 mg, Oral, BH-q7a, Cox, Amy N, DO, 10 mg at 06/29/20 0102 .  morphine 2 MG/ML injection 2 mg, 2 mg, Intravenous, Q2H PRN, Athena Masse, MD .  ondansetron (ZOFRAN) tablet 4 mg, 4 mg, Oral, Q6H PRN **OR** ondansetron (ZOFRAN) injection 4 mg, 4 mg, Intravenous, Q6H PRN, Cox, Amy N, DO, 4 mg at 06/28/20 2215 .  oxyCODONE (Oxy IR/ROXICODONE) immediate release tablet 5 mg, 5 mg, Oral, Q4H PRN, Athena Masse, MD, 5 mg at 06/28/20 2107 .  pantoprazole (PROTONIX) 80 mg in sodium chloride 0.9 % 100 mL (0.8 mg/mL) infusion, 8 mg/hr, Intravenous, Continuous, Cox, Amy N, DO, Last Rate: 10 mL/hr at 06/29/20 0005, 8 mg/hr at 06/29/20 0005 .  phosphorus (K PHOS NEUTRAL) tablet 500 mg, 500 mg, Oral, TID, Jennye Boroughs, MD, 500 mg at 06/28/20  2107 .  predniSONE (DELTASONE) tablet 40 mg, 40 mg, Oral, Q breakfast, Jennye Boroughs, MD .  roflumilast (DALIRESP) tablet 250 mcg, 250 mcg, Oral, Daily, Cox, Amy N, DO, 250 mcg at 06/28/20 0926 .  sodium chloride flush (NS) 0.9 % injection 3 mL, 3 mL, Intravenous, PRN, Jennye Boroughs, MD .  umeclidinium bromide (INCRUSE ELLIPTA) 62.5 MCG/INH 1 puff, 1 puff, Inhalation, Daily, Benita Gutter, RPH, 1 puff at 06/28/20 1024    ALLERGIES   Rivaroxaban     REVIEW OF SYSTEMS    Review of Systems:  Gen:  Denies  fever, sweats, chills weigh loss  HEENT: Denies blurred vision, double vision, ear pain, eye pain, hearing loss, nose bleeds, sore throat Cardiac:  No dizziness, chest pain or heaviness, chest tightness,edema Resp:  Admits to worsening chest discomfort and pain Gi: Denies swallowing difficulty, stomach pain, nausea or vomiting, diarrhea, constipation, bowel incontinence  Gu:  Denies bladder incontinence, burning urine Ext:   Denies Joint pain, stiffness or swelling Skin: Denies  skin rash, easy bruising or bleeding or hives Endoc:  Denies polyuria, polydipsia , polyphagia or weight change Psych:   Denies depression, insomnia or hallucinations   Other:  All other systems negative   VS: BP 113/70 (BP Location: Left Arm)   Pulse 84   Temp 97.9 F (36.6 C) (Oral)   Resp 16   Ht 5\' 3"  (1.6 m)   Wt 45.4 kg   SpO2 96%   BMI 17.72 kg/m      PHYSICAL EXAM    GENERAL:NAD, no fevers, chills, no weakness no fatigue HEAD: Normocephalic, atraumatic.  EYES: Pupils equal, round, reactive to light. Extraocular muscles intact. No scleral icterus.  MOUTH: Moist mucosal membrane. Dentition intact. No abscess noted.  EAR, NOSE, THROAT: Clear without exudates. No external lesions.  NECK: Supple. No thyromegaly. No nodules. No JVD.  PULMONARY: Decreased air entrly bilaterally with ronchorous breath sounds CARDIOVASCULAR: S1 and S2. Regular rate and rhythm. No murmurs, rubs, or  gallops. No edema. Pedal pulses 2+ bilaterally.  GASTROINTESTINAL: Soft, nontender, nondistended. No masses. Positive bowel sounds. No hepatosplenomegaly.  MUSCULOSKELETAL: No swelling, clubbing, or edema. Range of motion full in all extremities.  NEUROLOGIC: Cranial nerves II through XII are intact. No gross focal neurological deficits. Sensation intact. Reflexes intact.  SKIN: No ulceration, lesions, rashes, or cyanosis. Skin warm and dry. Turgor intact.  PSYCHIATRIC: Mood, affect within normal limits. The patient is awake, alert and oriented x 3. Insight, judgment intact.       IMAGING    DG Chest 1 View  Result Date: 06/02/2020 CLINICAL DATA:  Shortness of breath, right lobe biopsy performed Wednesday EXAM: CHEST  1 VIEW COMPARISON:  Radiograph 12/16/2019, CT 05/30/2020 FINDINGS: There is persistent atelectasis and/or consolidation in the left lung base with a left pleural effusion as was seen on comparison CT imaging 3 days prior. Some additional bronchitic and bronchiectatic changes are seen diffusely throughout the lungs with some chronically coarsened interstitial changes and reticulonodular opacities throughout the lungs in a similar distribution to more remote comparison. No new consolidative opacity. No pneumothorax. No convincing CT features of edema at this time. The aorta is calcified. The remaining cardiomediastinal contours are unremarkable. No acute osseous or soft tissue abnormality. Degenerative changes are present in the imaged spine and shoulders. Telemetry leads and support devices overlie the chest. IMPRESSION: 1. Persistent atelectasis and/or consolidation in the left lung base with a left pleural effusion as was seen on comparison CT imaging. 2. Some chronic bronchitic/bronchiectatic changes with coarsened interstitium and diffuse reticulonodular opacities conspicuous for a long-standing/chronic atypical infection including mycobacterial etiologies. 3. No other acute  cardiopulmonary abnormalities. 4. The aorta is calcified. The remaining cardiomediastinal contours are unremarkable. Electronically Signed   By: Lovena Le M.D.   On: 06/02/2020 05:39   CT CHEST WO CONTRAST  Result Date: 05/30/2020 CLINICAL DATA:  Shortness of breath.  Preop for bronchoscopy. EXAM: CT CHEST WITHOUT CONTRAST TECHNIQUE: Multidetector CT imaging of the chest was performed following the standard protocol without IV contrast. COMPARISON:  May 03, 2020. FINDINGS: Cardiovascular: Atherosclerosis of thoracic aorta is noted without aneurysm formation. Normal cardiac size. Small pericardial effusion is noted. Coronary artery calcifications are noted. Mediastinum/Nodes: As noted on prior exam, there is extensive mediastinal adenopathy present particularly in the right paratracheal pretracheal subcarinal and left hilar regions. This results in narrowing of the left mainstem bronchus. Thyroid gland is  unremarkable. Esophagus is unremarkable. Lungs/Pleura: No pneumothorax is noted. Mild biapical scarring is noted. Mild to moderate left pleural effusion is noted with adjacent left lower lobe postobstructive atelectasis or infiltrate. Upper Abdomen: No acute abnormality. Musculoskeletal: No chest wall mass or suspicious bone lesions identified. IMPRESSION: 1. Mild to moderate left pleural effusion is noted with adjacent left lower lobe postobstructive atelectasis or infiltrate. 2. Small pericardial effusion is noted. 3. Coronary artery calcifications are noted suggesting coronary artery disease. 4. As noted on prior exam, there is extensive mediastinal adenopathy present particularly in the right paratracheal, subcarinal and left hilar regions. This results in narrowing of the left mainstem bronchus. This is concerning for malignancy or metastatic disease. Aortic Atherosclerosis (ICD10-I70.0). Electronically Signed   By: Marijo Conception M.D.   On: 05/30/2020 09:32   MR BRAIN W WO CONTRAST  Result Date:  06/05/2020 CLINICAL DATA:  Small-cell lung cancer, staging EXAM: MRI HEAD WITHOUT AND WITH CONTRAST TECHNIQUE: Multiplanar, multiecho pulse sequences of the brain and surrounding structures were obtained without and with intravenous contrast. CONTRAST:  31mL GADAVIST GADOBUTROL 1 MMOL/ML IV SOLN COMPARISON:  May 2019 FINDINGS: Motion artifact is present. Findings below are within this limitation. Brain: There is no acute infarction or intracranial hemorrhage. There is no intracranial mass, mass effect, or edema. There is no hydrocephalus or extra-axial fluid collection. Prominence of the ventricles and sulci reflects generalized parenchymal volume loss. Small chronic infarcts of the right frontal lobe cortex and caudate. Additional patchy and confluent areas of T2 hyperintensity in the supratentorial and pontine white matter are nonspecific but probably reflect moderate chronic microvascular ischemic changes. Appearance is similar to the prior study. No abnormal enhancement. Vascular: Major vessel flow voids at the skull base are preserved. Skull and upper cervical spine: Normal marrow signal is preserved. Sinuses/Orbits: Paranasal sinuses are aerated. Orbits are unremarkable. Other: Sella is unremarkable.  Mastoid air cells are clear. IMPRESSION: Suboptimal evaluation due to motion artifact. No evidence of intracranial metastatic disease. Small chronic infarcts and moderate chronic microvascular ischemic changes. Electronically Signed   By: Macy Mis M.D.   On: 06/05/2020 11:39   US Venous Img Lower Bilateral (DVT)  Result Date: 06/03/2020 CLINICAL DATA:  Shortness of breath EXAM: BILATERAL LOWER EXTREMITY VENOUS DOPPLER ULTRASOUND TECHNIQUE: Gray-scale sonography with compression, as well as color and duplex ultrasound, were performed to evaluate the deep venous system(s) from the level of the common femoral vein through the popliteal and proximal calf veins. COMPARISON:  None. FINDINGS: VENOUS Normal  compressibility of the common femoral, superficial femoral, and popliteal veins, as well as the visualized calf veins. Visualized portions of profunda femoral vein and great saphenous vein unremarkable. No filling defects to suggest DVT on grayscale or color Doppler imaging. Doppler waveforms show normal direction of venous flow, normal respiratory plasticity and response to augmentation. Limited views of the contralateral common femoral vein are unremarkable. OTHER None. Limitations: none IMPRESSION: No acute deep vein thrombosis in the visualized bilateral lower extremities. Electronically Signed   By: Valentino Saxon MD   On: 06/03/2020 13:20   DG Chest Port 1 View  Result Date: 06/27/2020 CLINICAL DATA:  Status post left thoracentesis. EXAM: PORTABLE CHEST 1 VIEW COMPARISON:  Chest radiograph of June 26, 2020 FINDINGS: The heart size and mediastinal contours are unchanged. Decreased size of the now small left pleural effusion. Decreased left basilar atelectasis. No visible pneumothorax. Chronic changes of COPD. The visualized skeletal structures are unchanged. IMPRESSION: Decreased size of the now small left  pleural effusion post thoracentesis. No visible pneumothorax. Electronically Signed   By: Dahlia Bailiff MD   On: 06/27/2020 11:40   DG Chest Portable 1 View  Result Date: 06/26/2020 CLINICAL DATA:  Shortness of breath EXAM: PORTABLE CHEST 1 VIEW COMPARISON:  06/05/2020 FINDINGS: Increased left pleural effusion and left lung atelectasis. Background changes COPD. No pneumothorax. Stable cardiomediastinal contours. IMPRESSION: Increased left pleural effusion and left lung atelectasis. Electronically Signed   By: Macy Mis M.D.   On: 06/26/2020 16:55   DG Chest Port 1 View  Result Date: 06/05/2020 CLINICAL DATA:  Shortness of breath EXAM: PORTABLE CHEST 1 VIEW COMPARISON:  06/02/2020 FINDINGS: Mild blunting of the left costophrenic angle is unchanged. Mild chronic bronchitic type changes.  Calcific aortic atherosclerosis. There is left retrocardiac consolidation/atelectasis, unchanged. IMPRESSION: Unchanged left retrocardiac consolidation/atelectasis. Electronically Signed   By: Ulyses Jarred M.D.   On: 06/05/2020 02:30   US THORACENTESIS ASP PLEURAL SPACE W/IMG GUIDE  Result Date: 06/27/2020 INDICATION: Symptomatic left sided pleural effusion. Please perform ultrasound-guided thoracentesis for diagnostic and therapeutic purposes. EXAM: US THORACENTESIS ASP PLEURAL SPACE W/IMG GUIDE COMPARISON:  Chest radiograph-06/26/2020; chest CT-05/30/2020 MEDICATIONS: None. COMPLICATIONS: None immediate. TECHNIQUE: Informed written consent was obtained from the patient after a discussion of the risks, benefits and alternatives to treatment. A timeout was performed prior to the initiation of the procedure. Initial ultrasound scanning demonstrates a moderate to large sized predominantly anechoic left-sided pleural effusion. The lower chest was prepped and draped in the usual sterile fashion. 1% lidocaine was used for local anesthesia. An ultrasound image was saved for documentation purposes. An 8 Fr Safe-T-Centesis catheter was introduced. The thoracentesis was performed. The catheter was removed and a dressing was applied. The patient tolerated the procedure well without immediate post procedural complication. The patient was escorted to have an upright chest radiograph. FINDINGS: A total of approximately 900 cc of slightly blood tinged serous fluid was removed. Requested samples were sent to the laboratory. IMPRESSION: Successful ultrasound-guided left sided thoracentesis yielding 900 cc of pleural fluid. Electronically Signed   By: Sandi Mariscal M.D.   On: 06/27/2020 12:56          ASSESSMENT/PLAN     Exudative moderate to large left pleural effusion  -Specimen with component of contamination from blood per report fluid is grossly red turbid in appearance which will falsely give exudative  neutrophilic predominant result -Reassuring findings include normal glucose and negative cytology at this time -There is absence of leukocytosis which also points away from parapneumonic/empyema related pleural fluid as etiology -Additional studies including flow cytometry, fungal culture, cholesterol in process -Fluid culture is negative to date -despite absence of peripheral edema, will obtain BNP and TTE for cardiac dysfunction. -Renal and hepatic function within reference range 24h ago -Gentle diuresis today with repeat CXR in 24h  Acute on chronic hypoxemic respiratory failure Partially related to COPD   - agree with empiric abx and steroids  -will obtain MRSA nasal , Procalcitonin and non-COVID resp viral panel to elucidate any infective potential as culprit for AECOPD. -COPD BODE index >7  -respiratory sputum cultures  Severe protein calorie malnutrition -patient with peripheral and bitemporal muscular wasting -albumin is low -this can also cause patient to develop pleural effusion and I will place consultation for RD dietary evaluation today -poor prongnostic finding long term   Metastatic small cell lung cancer   - new diagnosis and may be contributing to current exacerbation as mediastinal mass effect is singificant   Anxiety disorder  NOS   - complicating respiratory status    - patient reports episodes of breathlessness and feelings of suffocation and drowing due to advanced chronic lung disease.    - she appreciates and responds well do TID PRN ativan 1mg .         Thank you for allowing me to participate in the care of this patient.    Patient/Family are satisfied with care plan and all questions have been answered.  This document was prepared using Dragon voice recognition software and may include unintentional dictation errors.     Ottie Glazier, M.D.  Division of [facility]

## 2020-06-29 NOTE — Progress Notes (Signed)
Palliative medicine follow-up note:  I met with patient, husband, and two daughters. Together, we reviewed patient's work-up to date. Both daughters verbalized understanding that patient is frail and not considered a candidate for systemic cancer treatment. Patient and husband spoke earlier with Dr. Baruch Gouty to discuss option for XRT. Patient recognizes that this would be with palliative intent. Family is concerned that transporting her back and forth to the cancer center for XRT might be difficult given her frailty and progressive decline. Ultimately, patient stated that she did not see much of a point in pursuing XRT and would prefer to focus on being comfortable at home. All seem to understand that patient is likely nearing end-of-life. We discussed the option of hospice in detail. Both patient and family were interested in having hospice follow her at home. We also discussed the option of hospice IPU if needed for symptom management or end-of-life care. Family verbalized desire for [facility]. I notified their hospice liaison to coordinate.   Patient and family were okay with foregoing further work-up for cancer. Again, goal is to focus more on her comfort. They do want to continue weaning oxygen and attempt to get her home. I spoke with the RN who will contact RT to see if patient will tolerate reduction in HFNC.  Patient endorses dyspnea and anxiety with exertion. Will add morphine elixir for comfort.  We also discussed CODE STATUS. Patient stated clearly that she was not interested in intubation or resuscitation at end-of-life. Her family were in agreement with DNR/DNI. DNR order signed and placed on chart.  Plan: -Continue weaning O2 as tolerated -Home with hospice when medically ready -DNR/DNI -Morphine elixir 5 to 10 mg every 2 hours as needed for pain/dyspnea  Case and plan discussed with Drs. Luiz Ochoa, and [place]  Time Total: 30 minutes  Visit consisted of counseling and  education dealing with the complex and emotionally intense issues of symptom management and palliative care in the setting of serious and potentially life-threatening illness.Greater than 50%  of this time was spent counseling and coordinating care related to the above assessment and plan.  Signed by: Altha Harm, PhD, NP-C

## 2020-06-29 NOTE — Progress Notes (Addendum)
[facility]) Hospital Liaison RN note:  Received request from Altha Harm, NP for hospice services at home after discharge. Chart and patient information under review by [facility] physician. Hospice eligibility was approved.  Spoken with patient's daughter, [name] via phone to initiate education related to hospice philosophy, services and to answer any questions. She verbalized understanding of information given and had no questions. Per discussion, the plan is for discharge home in a few days once oxygen has been weaned from [place].  DME needs discussed. Patient already has a hospital bed, nebulizer and oxygen thru [employer]. Plan is to leave this DME in place until [facility] RN does admission visit. Before discharge we will arrange for O2 to meet patient's needs to be delivered to home. Address is correct on face sheet and contact is daughter, Rogue Bussing at [phone].  Please send signed and completed DNR home with patient. Please provide prescriptions at discharge as needed to ensure ongoing symptom management.  Above information shared with Awanya Caesar, TOC. [place] Liaison number provided to [facility].  Please call with any hospice related questions or concerns.  Thank you for the opportunity to participate in this patient's care.  Zandra Abts, RN [facility] Liaison [phone]

## 2020-06-29 NOTE — Progress Notes (Signed)
[place]  [phone] [phone]   Name: ANITRA DOXTATER Date: 06/29/2020 MRN: [id number]  DOB: 08-23-1940  Patient Care Team: Chase Picket, MD as PCP - General (Family Medicine) Telford Nab, RN as Oncology Nurse Navigator    REASON FOR CONSULTATION: Christine Morris is a 80 y.o. 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  Patient was hospitalized 06/02/2020 to 06/12/2020 with acute on chronic respiratory failure secondary to COPD exacerbation.  Patient is now readmitted 06/26/2020 with same.  Patient was also found to have left-sided pleural effusion status post thoracentesis with removal of 900 mL.  Additionally, she was found to have acute on chronic anemia with heme positive stools.  Palliative care was consulted to help address goals..    CODE STATUS: Full code  PAST MEDICAL HISTORY: Past Medical History:  Diagnosis Date  . Afib ([place])   . Anxiety   . Atrial fibrillation ([place]) 2017  . Cervical stenosis of spine   . COPD (chronic obstructive pulmonary disease) ([place])   . Depression   . Hyperlipidemia   . Hypothyroidism   . Nodule of right lung   . On supplemental oxygen by nasal cannula   . 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 Rupture of extensor tendon of finger   . Spondylosis of cervical region without myelopathy or radiculopathy   . Stroke ([place])   . Vertigo   . Wartenberg syndrome     PAST SURGICAL HISTORY:  Past Surgical History:  Procedure Laterality Date  . ABDOMINAL HYSTERECTOMY    . APPENDECTOMY    . BREAST BIOPSY Left 1971  . BREAST CYST EXCISION Left 1971  . CATARACT SURGERY Bilateral   . LOOP RECORDER IMPLANT    . OOPHORECTOMY Bilateral   . VIDEO BRONCHOSCOPY WITH ENDOBRONCHIAL NAVIGATION  N/A 05/30/2020   Procedure: VIDEO BRONCHOSCOPY WITH ENDOBRONCHIAL NAVIGATION;  Surgeon: Ottie Glazier, MD;  Location: [facility] ORS;  Service: Thoracic;  Laterality: N/A;  . VIDEO BRONCHOSCOPY WITH ENDOBRONCHIAL ULTRASOUND N/A 05/30/2020   Procedure: VIDEO BRONCHOSCOPY WITH ENDOBRONCHIAL ULTRASOUND;  Surgeon: Ottie Glazier, MD;  Location: [facility] ORS;  Service: Thoracic;  Laterality: N/A;    HEMATOLOGY/ONCOLOGY HISTORY:  Oncology History   No history exists.    ALLERGIES:  is allergic to rivaroxaban.  MEDICATIONS:  Current Facility-Administered Medications  Medication Dose Route Frequency Provider Last Rate Last Admin  . acetaminophen (TYLENOL) tablet 325 mg  325 mg Oral Q6H PRN Cox, Amy N, DO       Or  . acetaminophen (TYLENOL) suppository 325 mg  325 mg Rectal Q6H PRN Cox, Amy N, DO      . cefTRIAXone (ROCEPHIN) 1 g in sodium chloride 0.9 % 100 mL IVPB  1 g Intravenous Q24H Jennye Boroughs, MD 200 mL/hr at 06/28/20 1555 1 g at 06/28/20 1555  . escitalopram (LEXAPRO) tablet 20 mg  20 mg Oral Daily Cox, Amy N, DO   20 mg at 06/29/20 0919  . ferrous sulfate tablet 325 mg  325 mg Oral Tonye Pearson, MD   325 mg at 06/29/20 0919  . fluticasone furoate-vilanterol (BREO ELLIPTA) 200-25 MCG/INH 1 puff  1 puff Inhalation Daily Benita Gutter, RPH   1 puff at 06/29/20 1245  . furosemide (LASIX) injection 20 mg  20 mg Intravenous BID  Ottie Glazier, MD   20 mg at 06/29/20 9798  . ipratropium-albuterol (DUONEB) 0.5-2.5 (3) MG/3ML nebulizer solution 3 mL  3 mL Nebulization Q6H WA Cox, Amy N, DO   3 mL at 06/29/20 0732  . levothyroxine (SYNTHROID) tablet 75 mcg  75 mcg Oral QAC breakfast Cox, Amy N, DO   75 mcg at 06/29/20 9211  . megestrol (MEGACE) 400 MG/10ML suspension 625 mg  625 mg Oral Daily Jennye Boroughs, MD   625 mg at 06/29/20 9417  . metoprolol tartrate (LOPRESSOR) injection 5 mg  5 mg Intravenous Q2H PRN Cox, Amy N, DO      . montelukast (SINGULAIR) tablet 10 mg  10 mg Oral BH-q7a Cox,  Amy N, DO   10 mg at 06/29/20 4081  . morphine 2 MG/ML injection 2 mg  2 mg Intravenous Q2H PRN Athena Masse, MD      . ondansetron [facility]) tablet 4 mg  4 mg Oral Q6H PRN Cox, Amy N, DO       Or  . ondansetron (ZOFRAN) injection 4 mg  4 mg Intravenous Q6H PRN Cox, Amy N, DO   4 mg at 06/28/20 2215  . oxyCODONE (Oxy IR/ROXICODONE) immediate release tablet 5 mg  5 mg Oral Q4H PRN Athena Masse, MD   5 mg at 06/28/20 2107  . phosphorus (K PHOS NEUTRAL) tablet 500 mg  500 mg Oral TID Jennye Boroughs, MD   500 mg at 06/29/20 0920  . predniSONE (DELTASONE) tablet 40 mg  40 mg Oral Q breakfast Jennye Boroughs, MD   40 mg at 06/29/20 0920  . roflumilast (DALIRESP) tablet 250 mcg  250 mcg Oral Daily Cox, Amy N, DO   250 mcg at 06/29/20 0919  . sodium chloride flush (NS) 0.9 % injection 3 mL  3 mL Intravenous PRN Jennye Boroughs, MD      . umeclidinium bromide (INCRUSE ELLIPTA) 62.5 MCG/INH 1 puff  1 puff Inhalation Daily Benita Gutter, RPH   1 puff at 06/29/20 4481    VITAL SIGNS: BP (!) 111/56 (BP Location: Right Arm)   Pulse 67   Temp 98 F (36.7 C)   Resp 19   Ht _0  (1.6 m)   Wt 100 lb 0.7 oz (45.4 kg)   SpO2 94%   BMI 17.72 kg/m  Filed Weights   06/26/20 1627 06/28/20 0500 06/29/20 0418  Weight: 100 lb (45.4 kg) 99 lb 6.8 oz (45.1 kg) 100 lb 0.7 oz (45.4 kg)    Estimated body mass index is 17.72 kg/m as calculated from the following:   Height as of this encounter: _1  (1.6 m).   Weight as of this encounter: 100 lb 0.7 oz (45.4 kg).  LABS: CBC:    Component Value Date/Time   WBC 8.7 06/29/2020 0537   HGB 8.4 (L) 06/29/2020 0537   HGB 10.2 (L) 09/30/2012 0432   HCT 27.0 (L) 06/29/2020 0537   HCT 30.5 (L) 09/30/2012 0432   PLT 392 06/29/2020 0537   PLT 266 09/30/2012 0432   MCV 89.7 06/29/2020 0537   MCV 89 09/30/2012 0432   NEUTROABS 7.7 06/29/2020 0537   NEUTROABS 18.6 (H) 09/30/2012 0432   LYMPHSABS 0.5 (L) 06/29/2020 0537   LYMPHSABS 0.4 (L) 09/30/2012 0432    MONOABS 0.4 06/29/2020 0537   MONOABS 0.4 09/30/2012 0432   EOSABS 0.0 06/29/2020 0537   EOSABS 0.0 09/30/2012 0432   BASOSABS 0.0 06/29/2020 0537   BASOSABS 0.0 09/30/2012 0432  Comprehensive Metabolic Panel:    Component Value Date/Time   NA 139 06/28/2020 0538   NA 142 10/01/2012 0411   K 4.0 06/28/2020 0538   K 3.5 10/01/2012 0411   CL 99 06/28/2020 0538   CL 108 (H) 10/01/2012 0411   CO2 30 06/28/2020 0538   CO2 28 10/01/2012 0411   BUN 27 (H) 06/28/2020 0538   BUN 15 10/01/2012 0411   CREATININE 0.72 06/28/2020 0538   CREATININE 0.60 10/01/2012 0411   GLUCOSE 111 (H) 06/28/2020 0538   GLUCOSE 151 (H) 10/01/2012 0411   CALCIUM 9.0 06/28/2020 0538   CALCIUM 9.6 10/01/2012 0411   AST 20 06/26/2020 1634   AST 28 09/29/2012 1541   ALT 8 06/26/2020 1634   ALT 20 09/29/2012 1541   ALKPHOS 32 (L) 06/26/2020 1634   ALKPHOS 71 09/29/2012 1541   BILITOT 0.5 06/26/2020 1634   BILITOT 0.7 09/29/2012 1541   PROT 6.1 (L) 06/26/2020 1634   PROT 7.2 09/29/2012 1541   ALBUMIN 2.4 (L) 06/26/2020 1634   ALBUMIN 3.6 09/29/2012 1541    RADIOGRAPHIC STUDIES: DG Chest 1 View  Result Date: 06/02/2020 CLINICAL DATA:  Shortness of breath, right lobe biopsy performed Wednesday EXAM: CHEST  1 VIEW COMPARISON:  Radiograph 12/16/2019, CT 05/30/2020 FINDINGS: There is persistent atelectasis and/or consolidation in the left lung base with a left pleural effusion as was seen on comparison CT imaging 3 days prior. Some additional bronchitic and bronchiectatic changes are seen diffusely throughout the lungs with some chronically coarsened interstitial changes and reticulonodular opacities throughout the lungs in a similar distribution to more remote comparison. No new consolidative opacity. No pneumothorax. No convincing CT features of edema at this time. The aorta is calcified. The remaining cardiomediastinal contours are unremarkable. No acute osseous or soft tissue abnormality. Degenerative changes  are present in the imaged spine and shoulders. Telemetry leads and support devices overlie the chest. IMPRESSION: 1. Persistent atelectasis and/or consolidation in the left lung base with a left pleural effusion as was seen on comparison CT imaging. 2. Some chronic bronchitic/bronchiectatic changes with coarsened interstitium and diffuse reticulonodular opacities conspicuous for a long-standing/chronic atypical infection including mycobacterial etiologies. 3. No other acute cardiopulmonary abnormalities. 4. The aorta is calcified. The remaining cardiomediastinal contours are unremarkable. Electronically Signed   By: Lovena Le M.D.   On: 06/02/2020 05:39   MR BRAIN W WO CONTRAST  Result Date: 06/05/2020 CLINICAL DATA:  Small-cell lung cancer, staging EXAM: MRI HEAD WITHOUT AND WITH CONTRAST TECHNIQUE: Multiplanar, multiecho pulse sequences of the brain and surrounding structures were obtained without and with intravenous contrast. CONTRAST:  84m GADAVIST GADOBUTROL 1 MMOL/ML IV SOLN COMPARISON:  May 2019 FINDINGS: Motion artifact is present. Findings below are within this limitation. Brain: There is no acute infarction or intracranial hemorrhage. There is no intracranial mass, mass effect, or edema. There is no hydrocephalus or extra-axial fluid collection. Prominence of the ventricles and sulci reflects generalized parenchymal volume loss. Small chronic infarcts of the right frontal lobe cortex and caudate. Additional patchy and confluent areas of T2 hyperintensity in the supratentorial and pontine white matter are nonspecific but probably reflect moderate chronic microvascular ischemic changes. Appearance is similar to the prior study. No abnormal enhancement. Vascular: Major vessel flow voids at the skull base are preserved. Skull and upper cervical spine: Normal marrow signal is preserved. Sinuses/Orbits: Paranasal sinuses are aerated. Orbits are unremarkable. Other: Sella is unremarkable.  Mastoid air  cells are clear. IMPRESSION: Suboptimal evaluation due to motion  artifact. No evidence of intracranial metastatic disease. Small chronic infarcts and moderate chronic microvascular ischemic changes. Electronically Signed   By: Macy Mis M.D.   On: 06/05/2020 11:39   US Venous Img Lower Bilateral (DVT)  Result Date: 06/03/2020 CLINICAL DATA:  Shortness of breath EXAM: BILATERAL LOWER EXTREMITY VENOUS DOPPLER ULTRASOUND TECHNIQUE: Gray-scale sonography with compression, as well as color and duplex ultrasound, were performed to evaluate the deep venous system(s) from the level of the common femoral vein through the popliteal and proximal calf veins. COMPARISON:  None. FINDINGS: VENOUS Normal compressibility of the common femoral, superficial femoral, and popliteal veins, as well as the visualized calf veins. Visualized portions of profunda femoral vein and great saphenous vein unremarkable. No filling defects to suggest DVT on grayscale or color Doppler imaging. Doppler waveforms show normal direction of venous flow, normal respiratory plasticity and response to augmentation. Limited views of the contralateral common femoral vein are unremarkable. OTHER None. Limitations: none IMPRESSION: No acute deep vein thrombosis in the visualized bilateral lower extremities. Electronically Signed   By: Valentino Saxon MD   On: 06/03/2020 13:20   DG Chest Port 1 View  Result Date: 06/27/2020 CLINICAL DATA:  Status post left thoracentesis. EXAM: PORTABLE CHEST 1 VIEW COMPARISON:  Chest radiograph of June 26, 2020 FINDINGS: The heart size and mediastinal contours are unchanged. Decreased size of the now small left pleural effusion. Decreased left basilar atelectasis. No visible pneumothorax. Chronic changes of COPD. The visualized skeletal structures are unchanged. IMPRESSION: Decreased size of the now small left pleural effusion post thoracentesis. No visible pneumothorax. Electronically Signed   By: Dahlia Bailiff  MD   On: 06/27/2020 11:40   DG Chest Portable 1 View  Result Date: 06/26/2020 CLINICAL DATA:  Shortness of breath EXAM: PORTABLE CHEST 1 VIEW COMPARISON:  06/05/2020 FINDINGS: Increased left pleural effusion and left lung atelectasis. Background changes COPD. No pneumothorax. Stable cardiomediastinal contours. IMPRESSION: Increased left pleural effusion and left lung atelectasis. Electronically Signed   By: Macy Mis M.D.   On: 06/26/2020 16:55   DG Chest Port 1 View  Result Date: 06/05/2020 CLINICAL DATA:  Shortness of breath EXAM: PORTABLE CHEST 1 VIEW COMPARISON:  06/02/2020 FINDINGS: Mild blunting of the left costophrenic angle is unchanged. Mild chronic bronchitic type changes. Calcific aortic atherosclerosis. There is left retrocardiac consolidation/atelectasis, unchanged. IMPRESSION: Unchanged left retrocardiac consolidation/atelectasis. Electronically Signed   By: Ulyses Jarred M.D.   On: 06/05/2020 02:30   US THORACENTESIS ASP PLEURAL SPACE W/IMG GUIDE  Result Date: 06/27/2020 INDICATION: Symptomatic left sided pleural effusion. Please perform ultrasound-guided thoracentesis for diagnostic and therapeutic purposes. EXAM: US THORACENTESIS ASP PLEURAL SPACE W/IMG GUIDE COMPARISON:  Chest radiograph-06/26/2020; chest CT-05/30/2020 MEDICATIONS: None. COMPLICATIONS: None immediate. TECHNIQUE: Informed written consent was obtained from the patient after a discussion of the risks, benefits and alternatives to treatment. A timeout was performed prior to the initiation of the procedure. Initial ultrasound scanning demonstrates a moderate to large sized predominantly anechoic left-sided pleural effusion. The lower chest was prepped and draped in the usual sterile fashion. 1% lidocaine was used for local anesthesia. An ultrasound image was saved for documentation purposes. An 8 Fr Safe-T-Centesis catheter was introduced. The thoracentesis was performed. The catheter was removed and a dressing was  applied. The patient tolerated the procedure well without immediate post procedural complication. The patient was escorted to have an upright chest radiograph. FINDINGS: A total of approximately 900 cc of slightly blood tinged serous fluid was removed. Requested samples were sent  to the laboratory. IMPRESSION: Successful ultrasound-guided left sided thoracentesis yielding 900 cc of pleural fluid. Electronically Signed   By: Sandi Mariscal M.D.   On: 06/27/2020 12:56    PERFORMANCE STATUS (ECOG) : 3 - Symptomatic, >50% confined to bed  Review of Systems Unless otherwise noted, a complete review of systems is negative.  Physical Exam General: NAD frail-appearing Pulmonary: Some pursed lip breathing Abdomen: soft, nontender, + bowel sounds GU: no suprapubic tenderness Extremities: no edema, no joint deformities Skin: no rashes Neurological: Weakness but otherwise nonfocal  IMPRESSION: Clinically unchanged.  Patient remains on high flow nasal cannula 35 L / 35%.  Patient thinks that her breathing is slightly improved but I note use of pursed lip breathing when she talks.  No other symptomatic complaints reported.  Patient states that she remembers speaking with Dr. Janese Banks yesterday.  Patient recalls being told that she is not felt to be a candidate for systemic treatment but could potentially receive XRT.  We again discussed the option of hospice involvement.  We also again briefly discussed CODE STATUS.  Patient wants her daughters to be involved in decision-making.  I called patient's daughter, [name], who will speak to her sister and they will come to the hospital this afternoon to discuss decision-making.  PLAN: -Continue current scope of treatment -Family meeting  Case and plan discussed with Drs. Jennette Kettle   Time Total: 35 minutes  Visit consisted of counseling and education dealing with the complex and emotionally intense issues of symptom management and palliative care in the setting  of serious and potentially life-threatening illness.Greater than 50%  of this time was spent counseling and coordinating care related to the above assessment and plan.  Signed by: Altha Harm, PhD, NP-C

## 2020-06-29 NOTE — Progress Notes (Signed)
Initial Nutrition Assessment  DOCUMENTATION CODES:   Severe malnutrition in context of chronic illness  INTERVENTION:   Ensure Enlive po BID, each supplement provides 350 kcal and 20 grams of protein  Magic cup TID with meals, each supplement provides 290 kcal and 9 grams of protein  Vitamin C 279m po BID  MVI daily   Liberalize diet   Pt at high refeed risk; recommend monitor potassium, magnesium and phosphorus labs daily until stable  NUTRITION DIAGNOSIS:   Severe Malnutrition related to chronic illness (COPD, MAC, lung cancer) as evidenced by 16 percent weight loss in 9 months, severe fat depletion,severe muscle depletion  GOAL:   Patient will meet greater than or equal to 90% of their needs  MONITOR:   PO intake,Supplement acceptance,Labs,Weight trends,Skin,I & O's  REASON FOR ASSESSMENT:   Consult Assessment of nutrition requirement/status  ASSESSMENT:   80y.o. female with medical history significant for advanced COPD with chronic hypoxia on 3 to 4 L home oxygen continuously at baseline, atrial fibrillation on Eliquis, small cell carcinoma new diagnosis, atypical granulomatosis infection with MAC, COPD and cachexia who presents to the emergency department for chief concerns of worsening shortness of breath.   Met with pt in room today. Pt reports poor appetite and oral intake at baseline. Pt reports that she gets full really quickly and is unable to eat more than a few bites. Pt does report drinking chocolate Boost at home. Pt ate 25% of her breakfast this morning and ate 50% of her lunch. RD will add supplements and vitamins to help pt meet her estimated needs. Pt with widespread ecchymosis; will add vitamin C supplementation. RD will also liberalize pt's diet as pt is not eating enough to exceed any nutrient limits. Per chart, pt is down 22lbs(16%) over the past 9 months; this is significant weight loss. Pt is likely at refeed risk.   Medications reviewed and  include: ferrous sulfate, lasix, synthroid, megace, Kphos, prednisone, ceftriaxone   Labs reviewed: K 4.0 wnl, P 2.8 wnl, Mg 2.0 wnl Hgb 8.4(L), Hct 27.0(L)  NUTRITION - FOCUSED PHYSICAL EXAM:  Flowsheet Row Most Recent Value  Orbital Region Severe depletion  Upper Arm Region Severe depletion  Thoracic and Lumbar Region Moderate depletion  Buccal Region Moderate depletion  Temple Region Severe depletion  Clavicle Bone Region Severe depletion  Clavicle and Acromion Bone Region Severe depletion  Scapular Bone Region Severe depletion  Dorsal Hand Severe depletion  Patellar Region Severe depletion  Anterior Thigh Region Severe depletion  Posterior Calf Region Severe depletion  Edema (RD Assessment) Mild  Hair Reviewed  Eyes Reviewed  Mouth Reviewed  Skin Reviewed  Nails Reviewed     Diet Order:   Diet Order            Diet Heart Room service appropriate? Yes; Fluid consistency: Thin  Diet effective now                EDUCATION NEEDS:   Education needs have been addressed  Skin:  Skin Assessment: Reviewed RN Assessment (ecchymosis)  Last BM:  2/3  Height:   Ht Readings from Last 1 Encounters:  06/26/20 _0  (1.6 m)    Weight:   Wt Readings from Last 1 Encounters:  06/29/20 45.4 kg    Ideal Body Weight:  52.27 kg  BMI:  Body mass index is 17.72 kg/m.  Estimated Nutritional Needs:   Kcal:  1400-1600kcal/day  Protein:  70-80g/day  Fluid:  1.2-1.4L/day  CKoleen DistanceMS, RD,  LDN Please refer to [facility] for RD and/or RD on-call/weekend/after hours pager  

## 2020-06-29 NOTE — Progress Notes (Signed)
Progress Note    Christine Morris  EPP:[id number] DOB: 09/14/1940  DOA: 06/26/2020 PCP: Chase Picket, MD      Brief Narrative:    Medical records reviewed and are as summarized below:  Christine Morris is a 80 y.o. 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  (Underweight)    Acute on chronic hypoxic respiratory failure: She remains on oxygen via heated humidified HFNC (FiO2 35% at 45 L/min).  She is on 3 L/min oxygen at home. Taper down oxygen as able.  Left pleural effusion: S/p left-sided thoracentesis with removal of 900 cc of red-tinged fluid on 06/27/2020.  Pleural fluid is exudative but negative for malignancy.    COPD with suspected exacerbation contributing to hypoxia: Continue steroids, empiric IV antibiotics and bronchodilators.    Anemia of chronic disease, iron deficiency anemia, positive heme stools: s/p transfusion with 1 unit of PRBCs.  Hold Eliquis.  She has been evaluated by the gastroenterologist.  No plan for immediate endoscopic work-up.  Continue ferrous sulfate.  Atrial fibrillation with RVR: Eliquis has been held.  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   History of stroke, hypothyroidism and depression.  Patient was seen by the hospice team today.  She has been approved for hospice at home.  However, she  still requiring high amount of oxygen and would not be safe for discharge home at this time.  Patient will be discharged once she is weaned down to an oxygen level that is safe for discharge to home.  I was informed by Kieth Brightly, hospice nurse, that hospice team can provide oxygen support up to 15 L/min via high flow nasal cannula.    Diet Order            Diet regular Room service appropriate? Yes; Fluid consistency: Thin  Diet effective now                    Consultants:  Pulmonologist  Palliative care team  Hospice team  Procedures:  Left-sided thoracentesis with removal of 900 cc of fluid    Medications:   . vitamin C  250 mg Oral BID  . escitalopram  20 mg Oral Daily  . feeding supplement  237 mL Oral BID BM  . ferrous sulfate  325 mg Oral QODAY  . fluticasone furoate-vilanterol  1 puff Inhalation Daily  . furosemide  20 mg Intravenous BID  . levothyroxine  75 mcg Oral QAC breakfast  . megestrol  625 mg Oral Daily  . montelukast  10 mg Oral BH-q7a  . [START ON 06/30/2020] multivitamin with minerals  1 tablet Oral Daily  . phosphorus  500 mg Oral TID  . predniSONE  40 mg Oral Q breakfast  . roflumilast  250 mcg Oral Daily  . umeclidinium bromide  1 puff Inhalation Daily   Continuous Infusions: . cefTRIAXone (ROCEPHIN)  IV 1 g (06/28/20 1555)     Anti-infectives (From admission, onward)   Start     Dose/Rate Route Frequency Ordered Stop   06/28/20 1600  cefTRIAXone (ROCEPHIN) 1 g in sodium chloride 0.9 % 100 mL IVPB        1 g 200 mL/hr over 30 Minutes Intravenous Every 24 hours 06/28/20 1450               Family Communication/Anticipated D/C date and plan/Code Status   DVT prophylaxis: Place TED hose Start: 06/26/20 2010     Code Status: DNR  Family Communication: With husband at bedside Disposition Plan:    Status is: Inpatient  Remains inpatient appropriate because:Inpatient level of care appropriate due to severity of  illness   Dispo: The patient is from: Home              Anticipated d/c is to: Home              Anticipated d/c date is: 2 days              Patient currently is not medically stable to d/c.   Difficult to place patient No              Subjective:   C/o shortness of breath.  Objective:    Vitals:   06/29/20 0418 06/29/20 0728 06/29/20 0732 06/29/20 1113  BP: 113/70 (!) 111/56  123/67  Pulse: 84 67  81  Resp: 16 19  17   Temp: 97.9 F (36.6 C) 98 F (36.7 C)  98.2 F (36.8 C)  TempSrc: Oral     SpO2: 96% 96% 94% 95%  Weight: 45.4 kg     Height:       No data found.   Intake/Output Summary (Last 24 hours) at 06/29/2020 1512 Last data filed at 06/29/2020 1330 Gross per 24 hour  Intake 770.91 ml  Output 1300 ml  Net -529.09 ml   Filed Weights   06/26/20 1627 06/28/20 0500 06/29/20 0418  Weight: 45.4 kg 45.1 kg 45.4 kg    Exam:  GEN: NAD SKIN: Multiple bruises on bilateral upper extremities EYES: No pallor or icterus ENT: MMM CV: RRR PULM: CTA B ABD: soft, ND, NT, +BS CNS: AAO x 3, non focal EXT: No edema or tenderness        Data Reviewed:   I have personally reviewed following labs and imaging studies:  Labs: Labs show the following:   Basic Metabolic Panel: Recent Labs  Lab 06/26/20 1634 06/27/20 0322 06/28/20 0538 06/29/20 0537  NA 140 143 139  --   K 3.3* 3.9 4.0  --   CL 99 101 99  --   CO2 29 30 30   --   GLUCOSE 135* 157* 111*  --   BUN 19 25* 27*  --   CREATININE 0.55 0.82 0.72  --   CALCIUM 9.5 9.4 9.0  --   MG  --  1.6* 2.0  --   PHOS  --   --  1.9* 2.8   GFR Estimated Creatinine Clearance: 41.5 mL/min (by C-G formula based on SCr of 0.72 mg/dL). Liver Function Tests: Recent Labs  Lab 06/26/20 1634  AST 20  ALT 8  ALKPHOS 32*  BILITOT 0.5  PROT 6.1*  ALBUMIN 2.4*   No results for input(s): LIPASE, AMYLASE in the last 168 hours. No results for input(s): AMMONIA in the last 168 hours. Coagulation  profile No results for input(s): INR, PROTIME  in the last 168 hours.  CBC: Recent Labs  Lab 06/26/20 1634 06/27/20 0322 06/28/20 0538 06/29/20 0537  WBC 15.7* 12.5* 8.6 8.7  NEUTROABS  --   --  7.9* 7.7  HGB 7.7* 8.2* 8.5* 8.4*  HCT 26.1* 26.8* 27.4* 27.0*  MCV 94.2 92.4 90.4 89.7  PLT 443* 379 356 392   Cardiac Enzymes: No results for input(s): CKTOTAL, CKMB, CKMBINDEX, TROPONINI in the last 168 hours. BNP (last 3 results) No results for input(s): PROBNP in the last 8760 hours. CBG: No results for input(s): GLUCAP in the last 168 hours. D-Dimer: No results for input(s): DDIMER in the last 72 hours. Hgb A1c: No results for input(s): HGBA1C in the last 72 hours. Lipid Profile: No results for input(s): CHOL, HDL, LDLCALC, TRIG, CHOLHDL, LDLDIRECT in the last 72 hours. Thyroid function studies: No results for input(s): TSH, T4TOTAL, T3FREE, THYROIDAB in the last 72 hours.  Invalid input(s): FREET3 Anemia work up: Recent Labs    06/27/20 0238 06/27/20 1302  VITAMINB12  --  1,855*  FOLATE 6.7  --   FERRITIN 75  --   TIBC 263  --   IRON 11*  --    Sepsis Labs: Recent Labs  Lab 06/26/20 1634 06/27/20 0322 06/28/20 0538 06/29/20 0537  PROCALCITON  --   --   --  0.60  WBC 15.7* 12.5* 8.6 8.7    Microbiology Recent Results (from the past 240 hour(s))  SARS Coronavirus 2 by RT PCR (hospital order, performed in [facility] hospital lab) Nasopharyngeal Nasopharyngeal Swab     Status: None   Collection Time: 06/26/20  4:29 PM   Specimen: Nasopharyngeal Swab  Result Value Ref Range Status   SARS Coronavirus 2 NEGATIVE NEGATIVE Final    Comment: (NOTE) SARS-CoV-2 target nucleic acids are NOT DETECTED.  The SARS-CoV-2 RNA is generally detectable in upper and lower respiratory specimens during the acute phase of infection. The lowest concentration of SARS-CoV-2 viral copies this assay can detect is 250 copies / mL. A negative result does not preclude SARS-CoV-2  infection and should not be used as the sole basis for treatment or other patient management decisions.  A negative result may occur with improper specimen collection / handling, submission of specimen other than nasopharyngeal swab, presence of viral mutation(s) within the areas targeted by this assay, and inadequate number of viral copies (<250 copies / mL). A negative result must be combined with clinical observations, patient history, and epidemiological information.  Fact Sheet for Patients:   StrictlyIdeas.no  Fact Sheet for Healthcare Providers: BankingDealers.co.za  This test is not yet approved or  cleared by the [place] FDA and has been authorized for detection and/or diagnosis of SARS-CoV-2 by FDA under an Emergency Use Authorization (EUA).  This EUA will remain in effect (meaning this test can be used) for the duration of the COVID-19 declaration under Section 564(b)(1) of the Act, 21 U.S.C. section 360bbb-3(b)(1), unless the authorization is terminated or revoked sooner.  Performed at [facility], [address] [place], [place] [address]   Body fluid culture     Status: None (Preliminary result)   Collection Time: 06/27/20 11:38 AM   Specimen: PATH Cytology Pleural fluid  Result Value Ref Range Status   Specimen Description   Final    PLEURAL Performed at [facility], [address], [place], [place] [address]    Special Requests   Final    NONE Performed at [facility], [address] [place], [place] [address]    Gram Stain   Final    MODERATE WBC PRESENT, PREDOMINANTLY PMN NO ORGANISMS SEEN    Culture   Final    NO GROWTH 2 DAYS Performed at [facility] [address], [place], [place] [address]    Report Status PENDING  Incomplete  Acid Fast Smear (AFB)     Status: None   Collection Time: 06/27/20 11:38 AM   Specimen: PATH Cytology Pleural fluid  Result  Value Ref Range Status   AFB Specimen Processing Concentration  Final   Acid Fast Smear Negative  Final    Comment: (NOTE) Performed At: [facility] [place], [place] [id number] Rush Farmer MD JS:[id number]    Source (AFB) PLEURAL  Final    Comment: Performed at [facility], [address] [place], [place] [address]  MRSA PCR Screening     Status: None   Collection Time: 06/29/20  9:28 AM   Specimen: Nasopharyngeal  Result Value Ref Range Status   MRSA by PCR NEGATIVE NEGATIVE Final    Comment:        The GeneXpert MRSA Assay (FDA approved for NASAL specimens only), is one component of a comprehensive MRSA colonization surveillance program. It is not intended to diagnose MRSA infection nor to guide or monitor treatment for MRSA infections. Performed at [facility], [place], [place] [address]   Respiratory (~20 pathogens) panel by PCR     Status: None   Collection Time: 06/29/20  9:28 AM   Specimen: Nasopharyngeal Swab; Respiratory  Result Value Ref Range Status   Adenovirus NOT DETECTED NOT DETECTED Final   Coronavirus 229E NOT DETECTED NOT DETECTED Final    Comment: (NOTE) The Coronavirus on the Respiratory Panel, DOES NOT test for the novel  Coronavirus (2019 nCoV)    Coronavirus HKU1 NOT DETECTED NOT DETECTED Final   Coronavirus NL63 NOT DETECTED NOT DETECTED Final   Coronavirus OC43 NOT DETECTED NOT DETECTED Final   Metapneumovirus NOT DETECTED NOT DETECTED Final   Rhinovirus / Enterovirus NOT DETECTED NOT DETECTED Final   Influenza A NOT DETECTED NOT DETECTED Final   Influenza B NOT DETECTED NOT DETECTED Final   Parainfluenza Virus 1 NOT DETECTED NOT DETECTED Final   Parainfluenza Virus 2 NOT DETECTED NOT DETECTED Final   Parainfluenza Virus 3 NOT DETECTED NOT DETECTED Final   Parainfluenza Virus 4 NOT DETECTED NOT DETECTED Final   Respiratory Syncytial Virus NOT DETECTED NOT DETECTED Final    Bordetella pertussis NOT DETECTED NOT DETECTED Final   Bordetella Parapertussis NOT DETECTED NOT DETECTED Final   Chlamydophila pneumoniae NOT DETECTED NOT DETECTED Final   Mycoplasma pneumoniae NOT DETECTED NOT DETECTED Final    Comment: Performed at [facility], [place]. [address] [place], [place] [address]    Procedures and diagnostic studies:  No results found.             LOS: 2 days   Ytzel Gubler  [facility]   Pager on [email or web address]. If 7PM-7AM, please contact night-coverage at [email or web address]     06/29/2020, 3:12 PM

## 2020-06-30 ENCOUNTER — Other Ambulatory Visit: Payer: Self-pay | Admitting: Oncology

## 2020-06-30 DIAGNOSIS — C3411 Malignant neoplasm of upper lobe, right bronchus or lung: Secondary | ICD-10-CM | POA: Diagnosis not present

## 2020-06-30 DIAGNOSIS — J9621 Acute and chronic respiratory failure with hypoxia: Secondary | ICD-10-CM | POA: Diagnosis not present

## 2020-06-30 DIAGNOSIS — J90 Pleural effusion, not elsewhere classified: Secondary | ICD-10-CM | POA: Diagnosis not present

## 2020-06-30 LAB — BODY FLUID CULTURE: Culture: NO GROWTH

## 2020-06-30 LAB — FUNGUS CULTURE WITH STAIN

## 2020-06-30 LAB — FUNGAL ORGANISM REFLEX

## 2020-06-30 LAB — PROCALCITONIN: Procalcitonin: 0.21 ng/mL

## 2020-06-30 LAB — FUNGUS CULTURE RESULT

## 2020-06-30 MED ORDER — DRONABINOL 2.5 MG PO CAPS
5.0000 mg | ORAL_CAPSULE | Freq: Two times a day (BID) | ORAL | Status: DC
  Administered 2020-06-30 – 2020-07-04 (×8): 5 mg via ORAL
  Filled 2020-06-30 (×8): qty 2

## 2020-06-30 NOTE — Progress Notes (Signed)
Progress Note    Christine Morris  EOF:[id number] DOB: 1940/11/06  DOA: 06/26/2020 PCP: Chase Picket, MD      Brief Narrative:    Medical records reviewed and are as summarized below:  Christine Morris is a 80 y.o. 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  (Underweight)    Acute on chronic hypoxic respiratory failure: She has been weaned off of oxygen via heated humidifiedHFNC.  She is on 15 L/min oxygen via HFNC.  Continue to taper down oxygen as able.  Left pleural effusion: S/p left-sided thoracentesis with removal of 900 cc of red-tinged fluid on 06/27/2020.  Pleural fluid is exudative but negative for malignancy.    COPD with suspected exacerbation contributing to hypoxia: Continue steroids, empiric IV antibiotics and bronchodilators.    Anemia of chronic disease, iron deficiency anemia, positive heme stools: s/p transfusion with 1 unit of PRBCs.  Hold Eliquis.  She has been evaluated by the gastroenterologist.  No plan for immediate endoscopic work-up.  Continue ferrous sulfate.  Atrial fibrillation with RVR: Eliquis has been held.  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    History of stroke, hypothyroidism and depression.  Possible to discharge home tomorrow with hospice.    Diet Order            Diet regular Room  service appropriate? Yes; Fluid consistency: Thin  Diet effective now                    Consultants:  Pulmonologist  Palliative care team  Hospice team  Procedures:  Left-sided thoracentesis with removal of 900 cc of fluid    Medications:   . vitamin C  250 mg Oral BID  . dronabinol  5 mg Oral BID AC  . escitalopram  20 mg Oral Daily  . feeding supplement  237 mL Oral BID BM  . ferrous sulfate  325 mg Oral QODAY  . fluticasone furoate-vilanterol  1 puff Inhalation Daily  . furosemide  20 mg Intravenous BID  . levothyroxine  75 mcg Oral QAC breakfast  . megestrol  625 mg Oral Daily  . montelukast  10 mg Oral BH-q7a  . multivitamin with minerals  1 tablet Oral Daily  . predniSONE  40 mg Oral Q breakfast  . roflumilast  250 mcg Oral Daily  . umeclidinium bromide  1 puff Inhalation Daily   Continuous Infusions: . cefTRIAXone (ROCEPHIN)  IV 1 g (06/29/20 1635)     Anti-infectives (From admission, onward)   Start     Dose/Rate Route Frequency Ordered Stop   06/28/20 1600  cefTRIAXone (ROCEPHIN) 1 g in sodium chloride 0.9 % 100 mL IVPB        1 g 200 mL/hr over 30 Minutes Intravenous Every 24 hours 06/28/20 1450  Family Communication/Anticipated D/C date and plan/Code Status   DVT prophylaxis: Place TED hose Start: 06/26/20 2010     Code Status: DNR  Family Communication: With husband at bedside Disposition Plan:    Status is: Inpatient  Remains inpatient appropriate because:Inpatient level of care appropriate due to severity of illness   Dispo: The patient is from: Home              Anticipated d/c is to: Home              Anticipated d/c date is: 2 days              Patient currently is not medically stable to d/c.   Difficult to place patient No              Subjective:   C/o shortness of breath  Objective:    Vitals:   06/30/20 0358 06/30/20 0831 06/30/20 1148 06/30/20 1408  BP: (!) 142/69 135/65 95/65    Pulse: 69 71 71   Resp: (!) 22 16 16    Temp: 98 F (36.7 C) (!) 97.5 F (36.4 C) (!) 97.4 F (36.3 C)   TempSrc: Oral Oral Oral   SpO2: 100% 97% 99% 97%  Weight:      Height:       No data found.   Intake/Output Summary (Last 24 hours) at 06/30/2020 1508 Last data filed at 06/29/2020 2043 Gross per 24 hour  Intake 120 ml  Output 300 ml  Net -180 ml   Filed Weights   06/29/20 0418 06/30/20 0227 06/30/20 0356  Weight: 45.4 kg 42.6 kg 43.6 kg    Exam:  GEN: NAD SKIN: Multiple bruises on bilateral upper extremities EYES: No pallor or icterus ENT: MMM CV: RRR PULM: CTA B ABD: soft, ND, NT, +BS CNS: AAO x 3, non focal EXT: No edema or tenderness        Data Reviewed:   I have personally reviewed following labs and imaging studies:  Labs: Labs show the following:   Basic Metabolic Panel: Recent Labs  Lab 06/26/20 1634 06/27/20 0322 06/28/20 0538 06/29/20 0537  NA 140 143 139  --   K 3.3* 3.9 4.0  --   CL 99 101 99  --   CO2 29 30 30   --   GLUCOSE 135* 157* 111*  --   BUN 19 25* 27*  --   CREATININE 0.55 0.82 0.72  --   CALCIUM 9.5 9.4 9.0  --   MG  --  1.6* 2.0  --   PHOS  --   --  1.9* 2.8   GFR Estimated Creatinine Clearance: 39.9 mL/min (by C-G formula based on SCr of 0.72 mg/dL). Liver Function Tests: Recent Labs  Lab 06/26/20 1634  AST 20  ALT 8  ALKPHOS 32*  BILITOT 0.5  PROT 6.1*  ALBUMIN 2.4*   No results for input(s): LIPASE, AMYLASE in the last 168 hours. No results for input(s): AMMONIA in the last 168 hours. Coagulation profile No results for input(s): INR, PROTIME in the last 168 hours.  CBC: Recent Labs  Lab 06/26/20 1634 06/27/20 0322 06/28/20 0538 06/29/20 0537  WBC 15.7* 12.5* 8.6 8.7  NEUTROABS  --   --  7.9* 7.7  HGB 7.7* 8.2* 8.5* 8.4*  HCT 26.1* 26.8* 27.4* 27.0*  MCV 94.2 92.4 90.4 89.7  PLT 443* 379 356 392   Cardiac Enzymes: No results for input(s): CKTOTAL, CKMB, CKMBINDEX, TROPONINI in the last 168  hours. BNP (last 3 results) No results for input(s): PROBNP in the last 8760 hours. CBG: No results for input(s): GLUCAP in the last 168 hours. D-Dimer: No results for input(s): DDIMER in the last 72 hours. Hgb A1c: No results for input(s): HGBA1C in the last 72 hours. Lipid Profile: No results for input(s): CHOL, HDL, LDLCALC, TRIG, CHOLHDL, LDLDIRECT in the last 72 hours. Thyroid function studies: No results for input(s): TSH, T4TOTAL, T3FREE, THYROIDAB in the last 72 hours.  Invalid input(s): FREET3 Anemia work up: No results for input(s): VITAMINB12, FOLATE, FERRITIN, TIBC, IRON, RETICCTPCT in the last 72 hours. Sepsis Labs: Recent Labs  Lab 06/26/20 1634 06/27/20 0322 06/28/20 0538 06/29/20 0537 06/30/20 0405  PROCALCITON  --   --   --  0.60 0.21  WBC 15.7* 12.5* 8.6 8.7  --     Microbiology Recent Results (from the past 240 hour(s))  SARS Coronavirus 2 by RT PCR (hospital order, performed in [facility] hospital lab) Nasopharyngeal Nasopharyngeal Swab     Status: None   Collection Time: 06/26/20  4:29 PM   Specimen: Nasopharyngeal Swab  Result Value Ref Range Status   SARS Coronavirus 2 NEGATIVE NEGATIVE Final    Comment: (NOTE) SARS-CoV-2 target nucleic acids are NOT DETECTED.  The SARS-CoV-2 RNA is generally detectable in upper and lower respiratory specimens during the acute phase of infection. The lowest concentration of SARS-CoV-2 viral copies this assay can detect is 250 copies / mL. A negative result does not preclude SARS-CoV-2 infection and should not be used as the sole basis for treatment or other patient management decisions.  A negative result may occur with improper specimen collection / handling, submission of specimen other than nasopharyngeal swab, presence of viral mutation(s) within the areas targeted by this assay, and inadequate number of viral copies (<250 copies / mL). A negative result must be combined with clinical observations, patient  history, and epidemiological information.  Fact Sheet for Patients:   StrictlyIdeas.no  Fact Sheet for Healthcare Providers: BankingDealers.co.za  This test is not yet approved or  cleared by the [place] FDA and has been authorized for detection and/or diagnosis of SARS-CoV-2 by FDA under an Emergency Use Authorization (EUA).  This EUA will remain in effect (meaning this test can be used) for the duration of the COVID-19 declaration under Section 564(b)(1) of the Act, 21 U.S.C. section 360bbb-3(b)(1), unless the authorization is terminated or revoked sooner.  Performed at [facility], [address] [place], [place] [address]   Body fluid culture     Status: None   Collection Time: 06/27/20 11:38 AM   Specimen: PATH Cytology Pleural fluid  Result Value Ref Range Status   Specimen Description   Final    PLEURAL Performed at [facility], [address] [place], [place] [address]    Special Requests   Final    NONE Performed at [facility], [address] [place], [place] [address]    Gram Stain   Final    MODERATE WBC PRESENT, PREDOMINANTLY PMN NO ORGANISMS SEEN    Culture   Final    NO GROWTH Performed at [facility], [place] [address], [place], [place] [address]    Report Status 06/30/2020 FINAL  Final  Acid Fast Smear (AFB)     Status: None   Collection Time: 06/27/20 11:38 AM   Specimen: PATH Cytology Pleural fluid  Result Value Ref Range Status   AFB Specimen Processing Concentration  Final  Acid Fast Smear Negative  Final    Comment: (NOTE) Performed At: [facility] [place], [place] [id number] Rush Farmer MD [id number]    Source (AFB) PLEURAL  Final    Comment: Performed at [facility], [address] [place], [place] [address]  MRSA PCR Screening     Status: None   Collection Time: 06/29/20  9:28 AM   Specimen:  Nasopharyngeal  Result Value Ref Range Status   MRSA by PCR NEGATIVE NEGATIVE Final    Comment:        The GeneXpert MRSA Assay (FDA approved for NASAL specimens only), is one component of a comprehensive MRSA colonization surveillance program. It is not intended to diagnose MRSA infection nor to guide or monitor treatment for MRSA infections. Performed at [facility], [place], [place] [address]   Respiratory (~20 pathogens) panel by PCR     Status: None   Collection Time: 06/29/20  9:28 AM   Specimen: Nasopharyngeal Swab; Respiratory  Result Value Ref Range Status   Adenovirus NOT DETECTED NOT DETECTED Final   Coronavirus 229E NOT DETECTED NOT DETECTED Final    Comment: (NOTE) The Coronavirus on the Respiratory Panel, DOES NOT test for the novel  Coronavirus (2019 nCoV)    Coronavirus HKU1 NOT DETECTED NOT DETECTED Final   Coronavirus NL63 NOT DETECTED NOT DETECTED Final   Coronavirus OC43 NOT DETECTED NOT DETECTED Final   Metapneumovirus NOT DETECTED NOT DETECTED Final   Rhinovirus / Enterovirus NOT DETECTED NOT DETECTED Final   Influenza A NOT DETECTED NOT DETECTED Final   Influenza B NOT DETECTED NOT DETECTED Final   Parainfluenza Virus 1 NOT DETECTED NOT DETECTED Final   Parainfluenza Virus 2 NOT DETECTED NOT DETECTED Final   Parainfluenza Virus 3 NOT DETECTED NOT DETECTED Final   Parainfluenza Virus 4 NOT DETECTED NOT DETECTED Final   Respiratory Syncytial Virus NOT DETECTED NOT DETECTED Final   Bordetella pertussis NOT DETECTED NOT DETECTED Final   Bordetella Parapertussis NOT DETECTED NOT DETECTED Final   Chlamydophila pneumoniae NOT DETECTED NOT DETECTED Final   Mycoplasma pneumoniae NOT DETECTED NOT DETECTED Final    Comment: Performed at [facility], [place]. [address] [place], [place] [address]    Procedures and diagnostic studies:  No results found.             LOS: 3 days   Tecora Eustache  [facility]    Pager on [email or web address]. If 7PM-7AM, please contact night-coverage at [email or web address]     06/30/2020, 3:08 PM

## 2020-06-30 NOTE — Progress Notes (Signed)
Pulmonary Medicine          Date: 06/30/2020,   MRN# [id number] Christine Morris Aug 11, 1940     AdmissionWeight: 45.4 kg                 CurrentWeight: 43.6 kg      CHIEF COMPLAINT:   Mediastinal mass with LLL infiltrate   HISTORY OF PRESENT ILLNESS    Christine Morris is a pleasant 80 year old female with advanced COPD chronic hypoxemia with baseline 3 to 4 L/min supplemental oxygen at home she is on numerous respiratory medications for her chronic lung disease.  She is well-known to our service.  Recent medical events include diagnosis of small cell lung cancer and she has been referred to oncology with Dr. Janese Banks for this with possible therapy including radiation/chemo in the future.  In the interim she had developed acute on chronic hypoxemic respiratory failure and was found to have a moderate to large left pleural effusion. During interview and examination patient is on 36%/35L/min HFNC humidified.  On initial ultrasound scan pleural fluid appeared anechoic per radiology report however on fluid differential gross appearance was bloody red with neutrophil predominance and elevated LDH, this can be seen with trauma/procedure.  Cytology is negative and glucose is normal which points away from infection and malignancy as etiology.  Peripheral blood without leukocytosis also making pneumonia less likely.  She is being treated for community-acquired pneumonia and acute exacerbation of COPD empirically currently on Rocephin and systemic steroids with initial IV Solu-Medrol transition to p.o. prednisone at this time.  Repeat chest x-ray postthoracentesis does show reduction of initial left pleural fluid to now small sized effusion.  Additionally patient was found to have fecal occult blood stool positive and had GI evaluation however no worrisome findings were discovered and GI has signed off at this time.  She was also evaluated by palliative care services with plan to keep full scope of  therapy at this time and additional goals of care discussion with family and outpatient post discharge.  PCCM consulted due to acute on chronic hypoxemic respiratory failure with exudative left pleural effusion patient previously well-known and followed on outpatient.  06/30/2020- patient seen at bedside , currently on 15L/min .  Reports breathing improved she is smiling during interview.  She reports less chest discomfort, nausea has resolved.  She is not eating well despite Megace.  We discussed marinol and she is agreeable ive started this today. Spoke with husband today.   PAST MEDICAL HISTORY   Past Medical History:  Diagnosis Date  . Afib ([place])   . Anxiety   . Atrial fibrillation ([place]) 2017  . Cervical stenosis of spine   . COPD (chronic obstructive pulmonary disease) ([place])   . Depression   . Hyperlipidemia   . Hypothyroidism   . Nodule of right lung   . On supplemental oxygen by nasal cannula   . 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 Rupture of extensor tendon of finger   . Spondylosis of cervical region without myelopathy or radiculopathy   . Stroke ([name])   . Vertigo   . Wartenberg syndrome      SURGICAL HISTORY   Past Surgical History:  Procedure Laterality Date  . ABDOMINAL HYSTERECTOMY    . APPENDECTOMY    . BREAST BIOPSY Left 1971  . BREAST CYST EXCISION Left 1971  . CATARACT SURGERY Bilateral   . LOOP RECORDER IMPLANT    . OOPHORECTOMY Bilateral   .  VIDEO BRONCHOSCOPY WITH ENDOBRONCHIAL NAVIGATION Morris/A 05/30/2020   Procedure: VIDEO BRONCHOSCOPY WITH ENDOBRONCHIAL NAVIGATION;  Surgeon: Ottie Glazier, MD;  Location: [facility] ORS;  Service: Thoracic;  Laterality: Morris/A;  . VIDEO BRONCHOSCOPY WITH ENDOBRONCHIAL ULTRASOUND Morris/A 05/30/2020   Procedure: VIDEO BRONCHOSCOPY WITH ENDOBRONCHIAL ULTRASOUND;  Surgeon: Ottie Glazier, MD;  Location: [facility] OR[facility];  Service: Thoracic;  Laterality: Morris/A;     FAMILY HISTORY   History reviewed. No pertinent family  history.   SOCIAL HISTORY   Social History   Tobacco Use  . Smoking status: Former Research scientist (life sciences)  . Smokeless tobacco: Never Used  Vaping Use  . Vaping Use: Never used  Substance Use Topics  . Alcohol use: Never  . Drug use: Never     MEDICATIONS    Home Medication:    Current Medication:  Current Facility-Administered Medications:  .  ascorbic acid (VITAMIN C) tablet 250 mg, 250 mg, Oral, BID, Christine Boroughs, MD, 250 mg at 06/30/20 1012 .  cefTRIAXone (ROCEPHIN) 1 g in sodium chloride 0.9 % 100 mL IVPB, 1 g, Intravenous, Q24H, Christine Boroughs, MD, Last Rate: 200 mL/hr at 06/29/20 1635, 1 g at 06/29/20 1635 .  dronabinol (MARINOL) capsule 5 mg, 5 mg, Oral, BID AC, Christine Spadafora, MD .  escitalopram (LEXAPRO) tablet 20 mg, 20 mg, Oral, Daily, Cox, Amy N, DO, 20 mg at 06/30/20 1012 .  feeding supplement (ENSURE ENLIVE / ENSURE PLUS) liquid 237 mL, 237 mL, Oral, BID BM, Christine Boroughs, MD, 237 mL at 06/30/20 1013 .  ferrous sulfate tablet 325 mg, 325 mg, Oral, Tonye Pearson, MD, 325 mg at 06/29/20 0919 .  fluticasone furoate-vilanterol (BREO ELLIPTA) 200-25 MCG/INH 1 puff, 1 puff, Inhalation, Daily, Christine Morris, RPH, 1 puff at 06/30/20 1014 .  furosemide (LASIX) injection 20 mg, 20 mg, Intravenous, BID, Christine Morris, Christine Mceachron, MD, 20 mg at 06/30/20 1013 .  levothyroxine (SYNTHROID) tablet 75 mcg, 75 mcg, Oral, QAC breakfast, Cox, Amy N, DO, 75 mcg at 06/30/20 [id number] .  megestrol (MEGACE) 400 MG/10ML suspension 625 mg, 625 mg, Oral, Daily, Christine Boroughs, MD, 625 mg at 06/30/20 1012 .  metoprolol tartrate (LOPRESSOR) injection 5 mg, 5 mg, Intravenous, Q2H PRN, Cox, Amy N, DO .  montelukast (SINGULAIR) tablet 10 mg, 10 mg, Oral, BH-q7a, Cox, Amy N, DO, 10 mg at 06/30/20 3662 .  morphine 2 MG/ML injection 2 mg, 2 mg, Intravenous, Q2H PRN, Christine Masse, MD .  morphine CONCENTRATE 10 MG/0.5ML oral solution 5-10 mg, 5-10 mg, Oral, Q2H PRN, Borders, Christine Boys, NP .  multivitamin with  minerals tablet 1 tablet, 1 tablet, Oral, Daily, Christine Boroughs, MD, 1 tablet at 06/30/20 1012 .  ondansetron (ZOFRAN) tablet 4 mg, 4 mg, Oral, Q6H PRN **OR** ondansetron (ZOFRAN) injection 4 mg, 4 mg, Intravenous, Q6H PRN, Cox, Amy N, DO, 4 mg at 06/28/20 2215 .  oxyCODONE (Oxy IR/ROXICODONE) immediate release tablet 5 mg, 5 mg, Oral, Q4H PRN, Christine Masse, MD, 5 mg at 06/29/20 2040 .  predniSONE (DELTASONE) tablet 40 mg, 40 mg, Oral, Q breakfast, Christine Boroughs, MD, 40 mg at 06/30/20 1012 .  roflumilast (DALIRESP) tablet 250 mcg, 250 mcg, Oral, Daily, Cox, Amy N, DO, 250 mcg at 06/30/20 1013 .  sodium chloride flush (NS) 0.9 % injection 3 mL, 3 mL, Intravenous, PRN, Christine Boroughs, MD, 3 mL at 06/29/20 2040 .  umeclidinium bromide (INCRUSE ELLIPTA) 62.5 MCG/INH 1 puff, 1 puff, Inhalation, Daily, Christine Morris, RPH, 1 puff at 06/30/20 1021  ALLERGIES   Rivaroxaban     REVIEW OF SYSTEMS    Review of Systems:  Gen:  Denies  fever, sweats, chills weigh loss  HEENT: Denies blurred vision, double vision, ear pain, eye pain, hearing loss, nose bleeds, sore throat Cardiac:  No dizziness, chest pain or heaviness, chest tightness,edema Resp:  Admits to worsening chest discomfort and pain Gi: Denies swallowing difficulty, stomach pain, nausea or vomiting, diarrhea, constipation, bowel incontinence Gu:  Denies bladder incontinence, burning urine Ext:   Denies Joint pain, stiffness or swelling Skin: Denies  skin rash, easy bruising or bleeding or hives Endoc:  Denies polyuria, polydipsia , polyphagia or weight change Psych:   Denies depression, insomnia or hallucinations   Other:  All other systems negative   VS: BP 95/65 (BP Location: Left Arm)   Pulse 71   Temp (!) 97.4 F (36.3 C) (Oral)   Resp 16   Ht 5\' 3"  (1.6 m)   Wt 43.6 kg   SpO2 99%   BMI 17.02 kg/m      PHYSICAL EXAM    GENERAL:NAD, no fevers, chills, no weakness no fatigue HEAD: Normocephalic, atraumatic.   EYES: Pupils equal, round, reactive to light. Extraocular muscles intact. No scleral icterus.  MOUTH: Moist mucosal membrane. Dentition intact. No abscess noted.  EAR, NOSE, THROAT: Clear without exudates. No external lesions.  NECK: Supple. No thyromegaly. No nodules. No JVD.  PULMONARY: Decreased air entrly bilaterally with ronchorous breath sounds CARDIOVASCULAR: S1 and S2. Regular rate and rhythm. No murmurs, rubs, or gallops. No edema. Pedal pulses 2+ bilaterally.  GASTROINTESTINAL: Soft, nontender, nondistended. No masses. Positive bowel sounds. No hepatosplenomegaly.  MUSCULOSKELETAL: No swelling, clubbing, or edema. Range of motion full in all extremities.  NEUROLOGIC: Cranial nerves II through XII are intact. No gross focal neurological deficits. Sensation intact. Reflexes intact.  SKIN: No ulceration, lesions, rashes, or cyanosis. Skin warm and dry. Turgor intact.  PSYCHIATRIC: Mood, affect within normal limits. The patient is awake, alert and oriented x 3. Insight, judgment intact.       IMAGING    DG Chest 1 View  Result Date: 06/02/2020 CLINICAL DATA:  Shortness of breath, right lobe biopsy performed Wednesday EXAM: CHEST  1 VIEW COMPARISON:  Radiograph 12/16/2019, CT 05/30/2020 FINDINGS: There is persistent atelectasis and/or consolidation in the left lung base with a left pleural effusion as was seen on comparison CT imaging 3 days prior. Some additional bronchitic and bronchiectatic changes are seen diffusely throughout the lungs with some chronically coarsened interstitial changes and reticulonodular opacities throughout the lungs in a similar distribution to more remote comparison. No new consolidative opacity. No pneumothorax. No convincing CT features of edema at this time. The aorta is calcified. The remaining cardiomediastinal contours are unremarkable. No acute osseous or soft tissue abnormality. Degenerative changes are present in the imaged spine and shoulders. Telemetry  leads and support devices overlie the chest. IMPRESSION: 1. Persistent atelectasis and/or consolidation in the left lung base with a left pleural effusion as was seen on comparison CT imaging. 2. Some chronic bronchitic/bronchiectatic changes with coarsened interstitium and diffuse reticulonodular opacities conspicuous for a long-standing/chronic atypical infection including mycobacterial etiologies. 3. No other acute cardiopulmonary abnormalities. 4. The aorta is calcified. The remaining cardiomediastinal contours are unremarkable. Electronically Signed   By: Lovena Le M.D.   On: 06/02/2020 05:39   MR BRAIN W WO CONTRAST  Result Date: 06/05/2020 CLINICAL DATA:  Small-cell lung cancer, staging EXAM: MRI HEAD WITHOUT AND WITH CONTRAST TECHNIQUE: Multiplanar, multiecho  pulse sequences of the brain and surrounding structures were obtained without and with intravenous contrast. CONTRAST:  67mL GADAVIST GADOBUTROL 1 MMOL/ML IV SOLN COMPARISON:  May 2019 FINDINGS: Motion artifact is present. Findings below are within this limitation. Brain: There is no acute infarction or intracranial hemorrhage. There is no intracranial mass, mass effect, or edema. There is no hydrocephalus or extra-axial fluid collection. Prominence of the ventricles and sulci reflects generalized parenchymal volume loss. Small chronic infarcts of the right frontal lobe cortex and caudate. Additional patchy and confluent areas of T2 hyperintensity in the supratentorial and pontine white matter are nonspecific but probably reflect moderate chronic microvascular ischemic changes. Appearance is similar to the prior study. No abnormal enhancement. Vascular: Major vessel flow voids at the skull base are preserved. Skull and upper cervical spine: Normal marrow signal is preserved. Sinuses/Orbits: Paranasal sinuses are aerated. Orbits are unremarkable. Other: Sella is unremarkable.  Mastoid air cells are clear. IMPRESSION: Suboptimal evaluation due to  motion artifact. No evidence of intracranial metastatic disease. Small chronic infarcts and moderate chronic microvascular ischemic changes. Electronically Signed   By: Macy Mis M.D.   On: 06/05/2020 11:39   US Venous Img Lower Bilateral (DVT)  Result Date: 06/03/2020 CLINICAL DATA:  Shortness of breath EXAM: BILATERAL LOWER EXTREMITY VENOUS DOPPLER ULTRASOUND TECHNIQUE: Gray-scale sonography with compression, as well as color and duplex ultrasound, were performed to evaluate the deep venous system(s) from the level of the common femoral vein through the popliteal and proximal calf veins. COMPARISON:  None. FINDINGS: VENOUS Normal compressibility of the common femoral, superficial femoral, and popliteal veins, as well as the visualized calf veins. Visualized portions of profunda femoral vein and great saphenous vein unremarkable. No filling defects to suggest DVT on grayscale or color Doppler imaging. Doppler waveforms show normal direction of venous flow, normal respiratory plasticity and response to augmentation. Limited views of the contralateral common femoral vein are unremarkable. OTHER None. Limitations: none IMPRESSION: No acute deep vein thrombosis in the visualized bilateral lower extremities. Electronically Signed   By: Valentino Saxon MD   On: 06/03/2020 13:20   DG Chest Port 1 View  Result Date: 06/27/2020 CLINICAL DATA:  Status post left thoracentesis. EXAM: PORTABLE CHEST 1 VIEW COMPARISON:  Chest radiograph of June 26, 2020 FINDINGS: The heart size and mediastinal contours are unchanged. Decreased size of the now small left pleural effusion. Decreased left basilar atelectasis. No visible pneumothorax. Chronic changes of COPD. The visualized skeletal structures are unchanged. IMPRESSION: Decreased size of the now small left pleural effusion post thoracentesis. No visible pneumothorax. Electronically Signed   By: Dahlia Bailiff MD   On: 06/27/2020 11:40   DG Chest Portable 1  View  Result Date: 06/26/2020 CLINICAL DATA:  Shortness of breath EXAM: PORTABLE CHEST 1 VIEW COMPARISON:  06/05/2020 FINDINGS: Increased left pleural effusion and left lung atelectasis. Background changes COPD. No pneumothorax. Stable cardiomediastinal contours. IMPRESSION: Increased left pleural effusion and left lung atelectasis. Electronically Signed   By: Macy Mis M.D.   On: 06/26/2020 16:55   DG Chest Port 1 View  Result Date: 06/05/2020 CLINICAL DATA:  Shortness of breath EXAM: PORTABLE CHEST 1 VIEW COMPARISON:  06/02/2020 FINDINGS: Mild blunting of the left costophrenic angle is unchanged. Mild chronic bronchitic type changes. Calcific aortic atherosclerosis. There is left retrocardiac consolidation/atelectasis, unchanged. IMPRESSION: Unchanged left retrocardiac consolidation/atelectasis. Electronically Signed   By: Ulyses Jarred M.D.   On: 06/05/2020 02:30   US THORACENTESIS ASP PLEURAL SPACE W/IMG GUIDE  Result Date:  06/27/2020 INDICATION: Symptomatic left sided pleural effusion. Please perform ultrasound-guided thoracentesis for diagnostic and therapeutic purposes. EXAM: US THORACENTESIS ASP PLEURAL SPACE W/IMG GUIDE COMPARISON:  Chest radiograph-06/26/2020; chest CT-05/30/2020 MEDICATIONS: None. COMPLICATIONS: None immediate. TECHNIQUE: Informed written consent was obtained from the patient after a discussion of the risks, benefits and alternatives to treatment. A timeout was performed prior to the initiation of the procedure. Initial ultrasound scanning demonstrates a moderate to large sized predominantly anechoic left-sided pleural effusion. The lower chest was prepped and draped in the usual sterile fashion. 1% lidocaine was used for local anesthesia. An ultrasound image was saved for documentation purposes. An 8 Fr Safe-T-Centesis catheter was introduced. The thoracentesis was performed. The catheter was removed and a dressing was applied. The patient tolerated the procedure well  without immediate post procedural complication. The patient was escorted to have an upright chest radiograph. FINDINGS: A total of approximately 900 cc of slightly blood tinged serous fluid was removed. Requested samples were sent to the laboratory. IMPRESSION: Successful ultrasound-guided left sided thoracentesis yielding 900 cc of pleural fluid. Electronically Signed   By: Sandi Mariscal M.D.   On: 06/27/2020 12:56          ASSESSMENT/PLAN     Exudative moderate to large left pleural effusion  -Specimen with component of contamination from blood per report fluid is grossly red turbid in appearance which will falsely give exudative neutrophilic predominant result -Reassuring findings include normal glucose and negative cytology at this time -There is absence of leukocytosis which also points away from parapneumonic/empyema related pleural fluid as etiology -Additional studies including flow cytometry, fungal culture, cholesterol in process -Fluid culture is negative to date -despite absence of peripheral edema, will obtain BNP and TTE for cardiac dysfunction. -Renal and hepatic function within reference range 24h ago -Gentle diuresis today with repeat CXR in 24h  Acute on chronic hypoxemic respiratory failure Partially related to COPD   - agree with empiric abx and steroids  -will obtain MRSA nasal , Procalcitonin and non-COVID resp viral panel to elucidate any infective potential as culprit for AECOPD. -COPD BODE index >7  -respiratory sputum cultures  Severe protein calorie malnutrition -patient with peripheral and bitemporal muscular wasting -albumin is low -this can also cause patient to develop pleural effusion and I will place consultation for RD dietary evaluation today -poor prongnostic finding long term          -contuing megace , Adding Marinol 5mg  po BID         -appreciate palliative care input  Metastatic small cell lung cancer   - new diagnosis and may be  contributing to current exacerbation as mediastinal mass effect is singificant - s/p oncology and radiation oncology evaluation - plan for palliative radiation   Anxiety disorder NOS   - complicating respiratory status    - patient reports episodes of breathlessness and feelings of suffocation and drowing due to advanced chronic lung disease.    - she appreciates and responds well do TID PRN ativan 1mg .         Thank you for allowing me to participate in the care of this patient.    Patient/Family are satisfied with care plan and all questions have been answered.  This document was prepared using Dragon voice recognition software and may include unintentional dictation errors.     Ottie Glazier, M.D.  Division of [place]

## 2020-07-01 DIAGNOSIS — J9621 Acute and chronic respiratory failure with hypoxia: Secondary | ICD-10-CM | POA: Diagnosis not present

## 2020-07-01 DIAGNOSIS — J441 Chronic obstructive pulmonary disease with (acute) exacerbation: Secondary | ICD-10-CM | POA: Diagnosis not present

## 2020-07-01 DIAGNOSIS — C3411 Malignant neoplasm of upper lobe, right bronchus or lung: Secondary | ICD-10-CM | POA: Diagnosis not present

## 2020-07-01 DIAGNOSIS — F419 Anxiety disorder, unspecified: Secondary | ICD-10-CM | POA: Diagnosis not present

## 2020-07-01 LAB — CBC WITH DIFFERENTIAL/PLATELET
Abs Immature Granulocytes: 0.07 10*3/uL (ref 0.00–0.07)
Basophils Absolute: 0 10*3/uL (ref 0.0–0.1)
Basophils Relative: 0 %
Eosinophils Absolute: 0 10*3/uL (ref 0.0–0.5)
Eosinophils Relative: 0 %
HCT: 31.3 % — ABNORMAL LOW (ref 36.0–46.0)
Hemoglobin: 9.7 g/dL — ABNORMAL LOW (ref 12.0–15.0)
Immature Granulocytes: 1 %
Lymphocytes Relative: 2 %
Lymphs Abs: 0.2 10*3/uL — ABNORMAL LOW (ref 0.7–4.0)
MCH: 28 pg (ref 26.0–34.0)
MCHC: 31 g/dL (ref 30.0–36.0)
MCV: 90.5 fL (ref 80.0–100.0)
Monocytes Absolute: 0.3 10*3/uL (ref 0.1–1.0)
Monocytes Relative: 2 %
Neutro Abs: 13.6 10*3/uL — ABNORMAL HIGH (ref 1.7–7.7)
Neutrophils Relative %: 95 %
Platelets: 481 10*3/uL — ABNORMAL HIGH (ref 150–400)
RBC: 3.46 MIL/uL — ABNORMAL LOW (ref 3.87–5.11)
RDW: 15.2 % (ref 11.5–15.5)
WBC: 14.2 10*3/uL — ABNORMAL HIGH (ref 4.0–10.5)
nRBC: 0 % (ref 0.0–0.2)

## 2020-07-01 LAB — PHOSPHORUS: Phosphorus: 2.7 mg/dL (ref 2.5–4.6)

## 2020-07-01 LAB — BASIC METABOLIC PANEL
Anion gap: 13 (ref 5–15)
BUN: 30 mg/dL — ABNORMAL HIGH (ref 8–23)
CO2: 34 mmol/L — ABNORMAL HIGH (ref 22–32)
Calcium: 9.4 mg/dL (ref 8.9–10.3)
Chloride: 93 mmol/L — ABNORMAL LOW (ref 98–111)
Creatinine, Ser: 0.78 mg/dL (ref 0.44–1.00)
GFR, Estimated: >60 mL/min (ref >60)
Glucose, Bld: 161 mg/dL — ABNORMAL HIGH (ref 70–99)
Potassium: 3.4 mmol/L — ABNORMAL LOW (ref 3.5–5.1)
Sodium: 140 mmol/L (ref 135–145)

## 2020-07-01 LAB — PROCALCITONIN: Procalcitonin: <0.1 ng/mL

## 2020-07-01 LAB — MAGNESIUM: Magnesium: 2.2 mg/dL (ref 1.7–2.4)

## 2020-07-01 MED ORDER — APIXABAN 5 MG PO TABS
5.0000 mg | ORAL_TABLET | Freq: Two times a day (BID) | ORAL | Status: DC
  Administered 2020-07-01 – 2020-07-04 (×6): 5 mg via ORAL
  Filled 2020-07-01 (×6): qty 1

## 2020-07-01 MED ORDER — PROCHLORPERAZINE MALEATE 5 MG PO TABS
5.0000 mg | ORAL_TABLET | Freq: Two times a day (BID) | ORAL | Status: DC
  Administered 2020-07-01 – 2020-07-04 (×6): 5 mg via ORAL
  Filled 2020-07-01 (×8): qty 1

## 2020-07-01 MED ORDER — AMOXICILLIN-POT CLAVULANATE 875-125 MG PO TABS
1.0000 | ORAL_TABLET | Freq: Two times a day (BID) | ORAL | Status: DC
  Administered 2020-07-01 – 2020-07-03 (×5): 1 via ORAL
  Filled 2020-07-01 (×5): qty 1

## 2020-07-01 MED ORDER — PROCHLORPERAZINE MALEATE 5 MG PO TABS: 5.0000 mg | ORAL_TABLET | Freq: Four times a day (QID) | ORAL | Status: DC | PRN

## 2020-07-01 NOTE — Progress Notes (Signed)
Pt has converted back to NSR/ HR 70s

## 2020-07-01 NOTE — Progress Notes (Signed)
pts BP 80/61 (MAP 69)/ pt asymptomatic/ MD made aware.

## 2020-07-01 NOTE — Progress Notes (Addendum)
CCMD called to report pt in afib rvr HR 160s / PRN IV Metroprolol given with little improvement ( 130s)  / MD made aware and at bedside/  MD and RN spoke with pts spouse at bedside and daughter  via phone about deciding to treat HR or initiate comfort care measure/ per daughter they will discuss and make a decision later/ will continue to monitor

## 2020-07-01 NOTE — Progress Notes (Signed)
Progress Note    Christine Morris  OZH:[id number] DOB: [birth year]  DOA: 06/26/2020 PCP: Chase Picket, MD      Brief Narrative:    Medical records reviewed and are as summarized below:  Christine Morris is a 80 y.o. 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  (Underweight)    Acute on chronic hypoxic respiratory failure: She is on 11 L/min oxygen via HFNC.   Continue oxygen as able.  Left pleural effusion: S/p left-sided thoracentesis with removal of 900 cc of red-tinged fluid on 06/27/2020.  Pleural fluid is exudative but negative for malignancy.    COPD with suspected exacerbation contributing to hypoxia: Discontinue steroids.  Change IV ceftriaxone to Augmentin.  Continue bronchodilators.    Anemia of chronic disease, iron deficiency anemia, positive heme stools: s/p transfusion with 1 unit of PRBCs. She has been evaluated by the gastroenterologist.  No plan for immediate endoscopic work-up.  Continue ferrous sulfate.  Atrial fibrillation with RVR: Resume Eliquis.  Her daughter, Christine Morris, said "I do not want her to have a stroke and suffer".  She understands that patient is at increased risk of bleeding including but not limited to GI bleeding especially given recent positive heme stools and severe anemia requiring blood transfusion.  Check CBC, BMP, magnesium and phosphorus level today.  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    History of stroke, hypothyroidism and depression.  Plan was to discharge patient home today with hospice.  However, his family said that they had called hospice team and hospice team said they won't be available until Wednesday, 07/04/2020.  Family said her oxygen tank can only support up to 5 L/min oxygen.  Plan of care was discussed with her family (husband, 2 daughters- Forensic psychologist and [place]).  Patient had indicated that she did not want treatment of her rapid A. fib and she did not want any more treatment and wanted to proceed with comfort measures.  However, her husband said patient did not know where she was talking about.  I spoke to her 2 daughters.  [name] said that she wanted rapid A. fib to be treated and also wanted Eliquis to be resumed.  She said they were not ready for comfort care at this time and they wanted patient to get better before she is discharged home to start hospice care.    Diet Order            Diet regular Room service appropriate? Yes; Fluid consistency: Thin  Diet effective now                    Consultants:  Pulmonologist  Palliative care team  Hospice team  Procedures:  Left-sided thoracentesis with removal of 900 cc of fluid    Medications:   . amoxicillin-clavulanate  1 tablet Oral Q12H  . vitamin C  250  mg Oral BID  . dronabinol  5 mg Oral BID AC  . escitalopram  20 mg Oral Daily  . feeding supplement  237 mL Oral BID BM  . ferrous sulfate  325 mg Oral QODAY  . fluticasone furoate-vilanterol  1 puff Inhalation Daily  . levothyroxine  75 mcg Oral QAC breakfast  . megestrol  625 mg Oral Daily  . montelukast  10 mg Oral BH-q7a  . multivitamin with minerals  1 tablet Oral Daily  . roflumilast  250 mcg Oral Daily  . umeclidinium bromide  1 puff Inhalation Daily   Continuous Infusions:    Anti-infectives (From admission, onward)   Start      Dose/Rate Route Frequency Ordered Stop   07/01/20 1800  amoxicillin-clavulanate (AUGMENTIN) 875-125 MG per tablet 1 tablet        1 tablet Oral Every 12 hours 07/01/20 1402     06/28/20 1600  cefTRIAXone (ROCEPHIN) 1 g in sodium chloride 0.9 % 100 mL IVPB  Status:  Discontinued        1 g 200 mL/hr over 30 Minutes Intravenous Every 24 hours 06/28/20 1450 07/01/20 1400             Family Communication/Anticipated D/C date and plan/Code Status   DVT prophylaxis: Place TED hose Start: 06/26/20 2010     Code Status: DNR  Family Communication: With husband at bedside Disposition Plan:    Status is: Inpatient  Remains inpatient appropriate because:Inpatient level of care appropriate due to severity of illness   Dispo: The patient is from: Home              Anticipated d/c is to: Home              Anticipated d/c date is: 2 days              Patient currently is not medically stable to d/c.   Difficult to place patient No              Subjective:   Interval events noted.  Her breathing is better today.  Her husband is at the bedside.   Objective:    Vitals:   07/01/20 1100 07/01/20 1123 07/01/20 1220 07/01/20 1326  BP:  93/76 104/75 (!) 80/61  Pulse: (!) 135 (!) 136 (!) 148 63  Resp:  16 (!) 22 (!) 22  Temp:  97.9 F (36.6 C) 97.8 F (36.6 C) 97.6 F (36.4 C)  TempSrc:      SpO2:  97% 99% 98%  Weight:      Height:       No data found.   Intake/Output Summary (Last 24 hours) at 07/01/2020 1424 Last data filed at 07/01/2020 1402 Gross per 24 hour  Intake 300 ml  Output 800 ml  Net -500 ml   Filed Weights   06/29/20 0418 06/30/20 0227 06/30/20 0356  Weight: 45.4 kg 42.6 kg 43.6 kg    Exam:  GEN: NAD SKIN: No rash EYES: EOMI ENT: MMM CV: RRR PULM: CTA B ABD: soft, ND, NT, +BS CNS: AAO x 3, non focal EXT: No edema or tenderness        Data Reviewed:   I have personally reviewed following labs and imaging  studies:  Labs: Labs show the following:   Basic Metabolic Panel: Recent Labs  Lab 06/26/20 1634 06/27/20 0322 06/28/20 0538 06/29/20 0537  NA 140 143 139  --   K 3.3* 3.9 4.0  --  CL 99 101 99  --   CO2 29 30 30   --   GLUCOSE 135* 157* 111*  --   BUN 19 25* 27*  --   CREATININE 0.55 0.82 0.72  --   CALCIUM 9.5 9.4 9.0  --   MG  --  1.6* 2.0  --   PHOS  --   --  1.9* 2.8   GFR Estimated Creatinine Clearance: 39.9 mL/min (by C-G formula based on SCr of 0.72 mg/dL). Liver Function Tests: Recent Labs  Lab 06/26/20 1634  AST 20  ALT 8  ALKPHOS 32*  BILITOT 0.5  PROT 6.1*  ALBUMIN 2.4*   No results for input(s): LIPASE, AMYLASE in the last 168 hours. No results for input(s): AMMONIA in the last 168 hours. Coagulation profile No results for input(s): INR, PROTIME in the last 168 hours.  CBC: Recent Labs  Lab 06/26/20 1634 06/27/20 0322 06/28/20 0538 06/29/20 0537  WBC 15.7* 12.5* 8.6 8.7  NEUTROABS  --   --  7.9* 7.7  HGB 7.7* 8.2* 8.5* 8.4*  HCT 26.1* 26.8* 27.4* 27.0*  MCV 94.2 92.4 90.4 89.7  PLT 443* 379 356 392   Cardiac Enzymes: No results for input(s): CKTOTAL, CKMB, CKMBINDEX, TROPONINI in the last 168 hours. BNP (last 3 results) No results for input(s): PROBNP in the last 8760 hours. CBG: No results for input(s): GLUCAP in the last 168 hours. D-Dimer: No results for input(s): DDIMER in the last 72 hours. Hgb A1c: No results for input(s): HGBA1C in the last 72 hours. Lipid Profile: No results for input(s): CHOL, HDL, LDLCALC, TRIG, CHOLHDL, LDLDIRECT in the last 72 hours. Thyroid function studies: No results for input(s): TSH, T4TOTAL, T3FREE, THYROIDAB in the last 72 hours.  Invalid input(s): FREET3 Anemia work up: No results for input(s): VITAMINB12, FOLATE, FERRITIN, TIBC, IRON, RETICCTPCT in the last 72 hours. Sepsis Labs: Recent Labs  Lab 06/26/20 1634 06/27/20 0322 06/28/20 0538 06/29/20 0537 06/30/20 0405 07/01/20 0524   PROCALCITON  --   --   --  0.60 0.21 <0.10  WBC 15.7* 12.5* 8.6 8.7  --   --     Microbiology Recent Results (from the past 240 hour(s))  SARS Coronavirus 2 by RT PCR (hospital order, performed in [place] hospital lab) Nasopharyngeal Nasopharyngeal Swab     Status: None   Collection Time: 06/26/20  4:29 PM   Specimen: Nasopharyngeal Swab  Result Value Ref Range Status   SARS Coronavirus 2 NEGATIVE NEGATIVE Final    Comment: (NOTE) SARS-CoV-2 target nucleic acids are NOT DETECTED.  The SARS-CoV-2 RNA is generally detectable in upper and lower respiratory specimens during the acute phase of infection. The lowest concentration of SARS-CoV-2 viral copies this assay can detect is 250 copies / mL. A negative result does not preclude SARS-CoV-2 infection and should not be used as the sole basis for treatment or other patient management decisions.  A negative result may occur with improper specimen collection / handling, submission of specimen other than nasopharyngeal swab, presence of viral mutation(s) within the areas targeted by this assay, and inadequate number of viral copies (<250 copies / mL). A negative result must be combined with clinical observations, patient history, and epidemiological information.  Fact Sheet for Patients:   StrictlyIdeas.no  Fact Sheet for Healthcare Providers: BankingDealers.co.za  This test is not yet approved or  cleared by the [place] FDA and has been authorized for detection and/or diagnosis of SARS-CoV-2 by FDA under an Emergency Use Authorization (  EUA).  This EUA will remain in effect (meaning this test can be used) for the duration of the COVID-19 declaration under Section 564(b)(1) of the Act, 21 U.S.C. section 360bbb-3(b)(1), unless the authorization is terminated or revoked sooner.  Performed at [facility], [address] [place], [place] [address]   Body fluid culture      Status: None   Collection Time: 06/27/20 11:38 AM   Specimen: PATH Cytology Pleural fluid  Result Value Ref Range Status   Specimen Description   Final    PLEURAL Performed at [facility], [address] [place], [place] [address]    Special Requests   Final    NONE Performed at [facility], [address] [place], [place] [address]    Gram Stain   Final    MODERATE WBC PRESENT, PREDOMINANTLY PMN NO ORGANISMS SEEN    Culture   Final    NO GROWTH Performed at [facility], [place] [address], [place], [place] [address]    Report Status 06/30/2020 FINAL  Final  Acid Fast Smear (AFB)     Status: None   Collection Time: 06/27/20 11:38 AM   Specimen: PATH Cytology Pleural fluid  Result Value Ref Range Status   AFB Specimen Processing Concentration  Final   Acid Fast Smear Negative  Final    Comment: (NOTE) Performed At: [facility] [place], [place] [id number] Rush Farmer MD [id number]    Source (AFB) PLEURAL  Final    Comment: Performed at [facility], [address] [place], [place] [address]  MRSA PCR Screening     Status: None   Collection Time: 06/29/20  9:28 AM   Specimen: Nasopharyngeal  Result Value Ref Range Status   MRSA by PCR NEGATIVE NEGATIVE Final    Comment:        The GeneXpert MRSA Assay (FDA approved for NASAL specimens only), is one component of a comprehensive MRSA colonization surveillance program. It is not intended to diagnose MRSA infection nor to guide or monitor treatment for MRSA infections. Performed at [facility], [place], [place] [address]   Respiratory (~20 pathogens) panel by PCR     Status: None   Collection Time: 06/29/20  9:28 AM   Specimen: Nasopharyngeal Swab; Respiratory  Result Value Ref Range Status   Adenovirus NOT DETECTED NOT DETECTED Final   Coronavirus 229E NOT DETECTED NOT DETECTED Final    Comment: (NOTE) The Coronavirus  on the Respiratory Panel, DOES NOT test for the novel  Coronavirus (2019 nCoV)    Coronavirus HKU1 NOT DETECTED NOT DETECTED Final   Coronavirus NL63 NOT DETECTED NOT DETECTED Final   Coronavirus OC43 NOT DETECTED NOT DETECTED Final   Metapneumovirus NOT DETECTED NOT DETECTED Final   Rhinovirus / Enterovirus NOT DETECTED NOT DETECTED Final   Influenza A NOT DETECTED NOT DETECTED Final   Influenza B NOT DETECTED NOT DETECTED Final   Parainfluenza Virus 1 NOT DETECTED NOT DETECTED Final   Parainfluenza Virus 2 NOT DETECTED NOT DETECTED Final   Parainfluenza Virus 3 NOT DETECTED NOT DETECTED Final   Parainfluenza Virus 4 NOT DETECTED NOT DETECTED Final   Respiratory Syncytial Virus NOT DETECTED NOT DETECTED Final   Bordetella pertussis NOT DETECTED NOT DETECTED Final   Bordetella Parapertussis NOT DETECTED NOT DETECTED Final   Chlamydophila pneumoniae NOT DETECTED NOT DETECTED Final   Mycoplasma pneumoniae NOT DETECTED NOT DETECTED Final    Comment: Performed at  [facility], [place] [address] [place], [place] [address]    Procedures and diagnostic studies:  No results found.             LOS: 4 days   Ebany Bowermaster  [facility]   Pager on [email or web address]. If 7PM-7AM, please contact night-coverage at [email or web address]     07/01/2020, 2:24 PM

## 2020-07-01 NOTE — Progress Notes (Signed)
Mobility Specialist - Progress Note   07/01/20 1400  Mobility  Activity Contraindicated/medical hold  Mobility performed by Mobility specialist    Per chart review, pt with elevated a-fib HR this date (130-160s) and low BP of 80/61. Will hold off on mobility session at this time and attempt when necessary and appropriate.    Kathee Delton Mobility Specialist 07/01/20, 2:16 PM

## 2020-07-01 NOTE — Progress Notes (Signed)
Pulmonary Medicine          Date: 07/01/2020,   MRN# [id number] Christine Morris 1940-07-12     AdmissionWeight: 45.4 kg                 CurrentWeight: 43.6 kg      CHIEF COMPLAINT:   Mediastinal mass with LLL infiltrate   HISTORY OF PRESENT ILLNESS    Christine Morris is a pleasant 80 year old female with advanced COPD chronic hypoxemia with baseline 3 to 4 L/min supplemental oxygen at home she is on numerous respiratory medications for her chronic lung disease.  She is well-known to our service.  Recent medical events include diagnosis of small cell lung cancer and she has been referred to oncology with Dr. Janese Banks for this with possible therapy including radiation/chemo in the future.  In the interim she had developed acute on chronic hypoxemic respiratory failure and was found to have a moderate to large left pleural effusion. During interview and examination patient is on 36%/35L/min HFNC humidified.  On initial ultrasound scan pleural fluid appeared anechoic per radiology report however on fluid differential gross appearance was bloody red with neutrophil predominance and elevated LDH, this can be seen with trauma/procedure.  Cytology is negative and glucose is normal which points away from infection and malignancy as etiology.  Peripheral blood without leukocytosis also making pneumonia less likely.  She is being treated for community-acquired pneumonia and acute exacerbation of COPD empirically currently on Rocephin and systemic steroids with initial IV Solu-Medrol transition to p.o. prednisone at this time.  Repeat chest x-ray postthoracentesis does show reduction of initial left pleural fluid to now small sized effusion.  Additionally patient was found to have fecal occult blood stool positive and had GI evaluation however no worrisome findings were discovered and GI has signed off at this time.  She was also evaluated by palliative care services with plan to keep full scope of  therapy at this time and additional goals of care discussion with family and outpatient post discharge.  PCCM consulted due to acute on chronic hypoxemic respiratory failure with exudative left pleural effusion patient previously well-known and followed on outpatient.  06/30/2020- patient seen at bedside , currently on 15L/min [place].  Reports breathing improved she is smiling during interview.  She reports less chest discomfort, nausea has resolved.  She is not eating well despite Megace.  We discussed marinol and she is agreeable ive started this today. Spoke with husband today.  07/01/2020- Patient seen and examined at bedside, complains of abd pain and nausea.  There is discussion with palliative regarding hospice.  Patient wishes to do PT/OT to get stronger. She wishes to treat atrial fibrillation.     PAST MEDICAL HISTORY   Past Medical History:  Diagnosis Date  . Afib (Christine Morris)   . Anxiety   . Atrial fibrillation (Christine Morris) 2017  . Cervical stenosis of spine   . COPD (chronic obstructive pulmonary disease) (Christine Morris)   . Depression   . Hyperlipidemia   . Hypothyroidism   . Nodule of right lung   . On supplemental oxygen by nasal cannula   . Osteonecrosis (Christine Morris)    OF BOTH HIPS  . Rupture of extensor tendon of finger   . Spondylosis of cervical region without myelopathy or radiculopathy   . Stroke (Christine Morris)   . Vertigo   . Christine Morris      SURGICAL HISTORY   Past Surgical History:  Procedure Laterality Date  . ABDOMINAL  HYSTERECTOMY    . APPENDECTOMY    . BREAST BIOPSY Left 1971  . BREAST CYST EXCISION Left 1971  . CATARACT SURGERY Bilateral   . LOOP RECORDER IMPLANT    . OOPHORECTOMY Bilateral   . VIDEO BRONCHOSCOPY WITH ENDOBRONCHIAL NAVIGATION N/A 05/30/2020   Procedure: VIDEO BRONCHOSCOPY WITH ENDOBRONCHIAL NAVIGATION;  Surgeon: Ottie Glazier, MD;  Location: [facility];  Service: Thoracic;  Laterality: N/A;  . VIDEO BRONCHOSCOPY WITH ENDOBRONCHIAL ULTRASOUND N/A 05/30/2020    Procedure: VIDEO BRONCHOSCOPY WITH ENDOBRONCHIAL ULTRASOUND;  Surgeon: Ottie Glazier, MD;  Location: [facility];  Service: Thoracic;  Laterality: N/A;     FAMILY HISTORY   History reviewed. No pertinent family history.   SOCIAL HISTORY   Social History   Tobacco Use  . Smoking status: Former Research scientist (life sciences)  . Smokeless tobacco: Never Used  Vaping Use  . Vaping Use: Never used  Substance Use Topics  . Alcohol use: Never  . Drug use: Never     MEDICATIONS    Home Medication:    Current Medication:  Current Facility-Administered Medications:  .  amoxicillin-clavulanate (AUGMENTIN) 875-125 MG per tablet 1 tablet, 1 tablet, Oral, Q12H, Jennye Boroughs, MD .  apixaban (ELIQUIS) tablet 5 mg, 5 mg, Oral, BID, Jennye Boroughs, MD .  ascorbic acid (VITAMIN C) tablet 250 mg, 250 mg, Oral, BID, Jennye Boroughs, MD, 250 mg at 07/01/20 0911 .  dronabinol (MARINOL) capsule 5 mg, 5 mg, Oral, BID AC, Hazyl Marseille, MD, 5 mg at 07/01/20 1220 .  escitalopram (LEXAPRO) tablet 20 mg, 20 mg, Oral, Daily, Cox, Amy N, DO, 20 mg at 07/01/20 0911 .  feeding supplement (ENSURE ENLIVE / ENSURE PLUS) liquid 237 mL, 237 mL, Oral, BID BM, Jennye Boroughs, MD, 237 mL at 07/01/20 0912 .  ferrous sulfate tablet 325 mg, 325 mg, Oral, Tonye Pearson, MD, 325 mg at 07/01/20 0911 .  fluticasone furoate-vilanterol (BREO ELLIPTA) 200-25 MCG/INH 1 puff, 1 puff, Inhalation, Daily, Benita Gutter, RPH, 1 puff at 07/01/20 0912 .  levothyroxine (SYNTHROID) tablet 75 mcg, 75 mcg, Oral, QAC breakfast, Cox, Amy N, DO, 75 mcg at 07/01/20 0610 .  megestrol (MEGACE) 400 MG/10ML suspension 625 mg, 625 mg, Oral, Daily, Jennye Boroughs, MD, 625 mg at 07/01/20 0911 .  metoprolol tartrate (LOPRESSOR) injection 5 mg, 5 mg, Intravenous, Q2H PRN, Cox, Amy N, DO, 5 mg at 07/01/20 1052 .  montelukast (SINGULAIR) tablet 10 mg, 10 mg, Oral, BH-q7a, Cox, Amy N, DO, 10 mg at 07/01/20 4098 .  morphine 2 MG/ML injection 2 mg, 2 mg,  Intravenous, Q2H PRN, Athena Masse, MD .  morphine CONCENTRATE 10 MG/0.5ML oral solution 5-10 mg, 5-10 mg, Oral, Q2H PRN, Borders, Kirt Boys, NP, 10 mg at 07/01/20 1191 .  multivitamin with minerals tablet 1 tablet, 1 tablet, Oral, Daily, Jennye Boroughs, MD, 1 tablet at 07/01/20 0911 .  oxyCODONE (Oxy IR/ROXICODONE) immediate release tablet 5 mg, 5 mg, Oral, Q4H PRN, Athena Masse, MD, 5 mg at 06/29/20 2040 .  roflumilast (DALIRESP) tablet 250 mcg, 250 mcg, Oral, Daily, Cox, Amy N, DO, 250 mcg at 07/01/20 0911 .  sodium chloride flush (NS) 0.9 % injection 3 mL, 3 mL, Intravenous, PRN, Jennye Boroughs, MD, 3 mL at 06/29/20 2040 .  umeclidinium bromide (INCRUSE ELLIPTA) 62.5 MCG/INH 1 puff, 1 puff, Inhalation, Daily, Benita Gutter, RPH, 1 puff at 07/01/20 4782    ALLERGIES   Rivaroxaban     REVIEW OF SYSTEMS    Review of Systems:  Gen:  Denies  fever, sweats, chills weigh loss  HEENT: Denies blurred vision, double vision, ear pain, eye pain, hearing loss, nose bleeds, sore throat Cardiac:  No dizziness, chest pain or heaviness, chest tightness,edema Resp:  Admits to worsening chest discomfort and pain Gi: Denies swallowing difficulty, stomach pain, nausea or vomiting, diarrhea, constipation, bowel incontinence Gu:  Denies bladder incontinence, burning urine Ext:   Denies Joint pain, stiffness or swelling Skin: Denies  skin rash, easy bruising or bleeding or hives Endoc:  Denies polyuria, polydipsia , polyphagia or weight change Psych:   Denies depression, insomnia or hallucinations   Other:  All other systems negative   VS: BP (!) 89/58   Pulse 74   Temp 98.1 F (36.7 C)   Resp (!) 22   Ht 5\' 3"  (1.6 m)   Wt 43.6 kg   SpO2 99%   BMI 17.02 kg/m      PHYSICAL EXAM    GENERAL:NAD, no fevers, chills, no weakness no fatigue HEAD: Normocephalic, atraumatic.  EYES: Pupils equal, round, reactive to light. Extraocular muscles intact. No scleral icterus.  MOUTH: Moist  mucosal membrane. Dentition intact. No abscess noted.  EAR, NOSE, THROAT: Clear without exudates. No external lesions.  NECK: Supple. No thyromegaly. No nodules. No JVD.  PULMONARY: Decreased air entrly bilaterally with ronchorous breath sounds CARDIOVASCULAR: S1 and S2. Regular rate and rhythm. No murmurs, rubs, or gallops. No edema. Pedal pulses 2+ bilaterally.  GASTROINTESTINAL: Soft, nontender, nondistended. No masses. Positive bowel sounds. No hepatosplenomegaly.  MUSCULOSKELETAL: No swelling, clubbing, or edema. Range of motion full in all extremities.  NEUROLOGIC: Cranial nerves II through XII are intact. No gross focal neurological deficits. Sensation intact. Reflexes intact.  SKIN: No ulceration, lesions, rashes, or cyanosis. Skin warm and dry. Turgor intact.  PSYCHIATRIC: Mood, affect within normal limits. The patient is awake, alert and oriented x 3. Insight, judgment intact.       IMAGING    DG Chest 1 View  Result Date: 06/02/2020 CLINICAL DATA:  Shortness of breath, right lobe biopsy performed Wednesday EXAM: CHEST  1 VIEW COMPARISON:  Radiograph 12/16/2019, CT 05/30/2020 FINDINGS: There is persistent atelectasis and/or consolidation in the left lung base with a left pleural effusion as was seen on comparison CT imaging 3 days prior. Some additional bronchitic and bronchiectatic changes are seen diffusely throughout the lungs with some chronically coarsened interstitial changes and reticulonodular opacities throughout the lungs in a similar distribution to more remote comparison. No new consolidative opacity. No pneumothorax. No convincing CT features of edema at this time. The aorta is calcified. The remaining cardiomediastinal contours are unremarkable. No acute osseous or soft tissue abnormality. Degenerative changes are present in the imaged spine and shoulders. Telemetry leads and support devices overlie the chest. IMPRESSION: 1. Persistent atelectasis and/or consolidation in the  left lung base with a left pleural effusion as was seen on comparison CT imaging. 2. Some chronic bronchitic/bronchiectatic changes with coarsened interstitium and diffuse reticulonodular opacities conspicuous for a long-standing/chronic atypical infection including mycobacterial etiologies. 3. No other acute cardiopulmonary abnormalities. 4. The aorta is calcified. The remaining cardiomediastinal contours are unremarkable. Electronically Signed   By: Lovena Le M.D.   On: 06/02/2020 05:39   MR BRAIN W WO CONTRAST  Result Date: 06/05/2020 CLINICAL DATA:  Small-cell lung cancer, staging EXAM: MRI HEAD WITHOUT AND WITH CONTRAST TECHNIQUE: Multiplanar, multiecho pulse sequences of the brain and surrounding structures were obtained without and with intravenous contrast. CONTRAST:  34mL GADAVIST GADOBUTROL 1 MMOL/ML  IV SOLN COMPARISON:  May 2019 FINDINGS: Motion artifact is present. Findings below are within this limitation. Brain: There is no acute infarction or intracranial hemorrhage. There is no intracranial mass, mass effect, or edema. There is no hydrocephalus or extra-axial fluid collection. Prominence of the ventricles and sulci reflects generalized parenchymal volume loss. Small chronic infarcts of the right frontal lobe cortex and caudate. Additional patchy and confluent areas of T2 hyperintensity in the supratentorial and pontine white matter are nonspecific but probably reflect moderate chronic microvascular ischemic changes. Appearance is similar to the prior study. No abnormal enhancement. Vascular: Major vessel flow voids at the skull base are preserved. Skull and upper cervical spine: Normal marrow signal is preserved. Sinuses/Orbits: Paranasal sinuses are aerated. Orbits are unremarkable. Other: Sella is unremarkable.  Mastoid air cells are clear. IMPRESSION: Suboptimal evaluation due to motion artifact. No evidence of intracranial metastatic disease. Small chronic infarcts and moderate chronic  microvascular ischemic changes. Electronically Signed   By: Macy Mis M.D.   On: 06/05/2020 11:39   US Venous Img Lower Bilateral (DVT)  Result Date: 06/03/2020 CLINICAL DATA:  Shortness of breath EXAM: BILATERAL LOWER EXTREMITY VENOUS DOPPLER ULTRASOUND TECHNIQUE: Gray-scale sonography with compression, as well as color and duplex ultrasound, were performed to evaluate the deep venous system(s) from the level of the common femoral vein through the popliteal and proximal calf veins. COMPARISON:  None. FINDINGS: VENOUS Normal compressibility of the common femoral, superficial femoral, and popliteal veins, as well as the visualized calf veins. Visualized portions of profunda femoral vein and great saphenous vein unremarkable. No filling defects to suggest DVT on grayscale or color Doppler imaging. Doppler waveforms show normal direction of venous flow, normal respiratory plasticity and response to augmentation. Limited views of the contralateral common femoral vein are unremarkable. OTHER None. Limitations: none IMPRESSION: No acute deep vein thrombosis in the visualized bilateral lower extremities. Electronically Signed   By: Valentino Saxon MD   On: 06/03/2020 13:20   DG Chest Port 1 View  Result Date: 06/27/2020 CLINICAL DATA:  Status post left thoracentesis. EXAM: PORTABLE CHEST 1 VIEW COMPARISON:  Chest radiograph of June 26, 2020 FINDINGS: The heart size and mediastinal contours are unchanged. Decreased size of the now small left pleural effusion. Decreased left basilar atelectasis. No visible pneumothorax. Chronic changes of COPD. The visualized skeletal structures are unchanged. IMPRESSION: Decreased size of the now small left pleural effusion post thoracentesis. No visible pneumothorax. Electronically Signed   By: Dahlia Bailiff MD   On: 06/27/2020 11:40   DG Chest Portable 1 View  Result Date: 06/26/2020 CLINICAL DATA:  Shortness of breath EXAM: PORTABLE CHEST 1 VIEW COMPARISON:   06/05/2020 FINDINGS: Increased left pleural effusion and left lung atelectasis. Background changes COPD. No pneumothorax. Stable cardiomediastinal contours. IMPRESSION: Increased left pleural effusion and left lung atelectasis. Electronically Signed   By: Macy Mis M.D.   On: 06/26/2020 16:55   DG Chest Port 1 View  Result Date: 06/05/2020 CLINICAL DATA:  Shortness of breath EXAM: PORTABLE CHEST 1 VIEW COMPARISON:  06/02/2020 FINDINGS: Mild blunting of the left costophrenic angle is unchanged. Mild chronic bronchitic type changes. Calcific aortic atherosclerosis. There is left retrocardiac consolidation/atelectasis, unchanged. IMPRESSION: Unchanged left retrocardiac consolidation/atelectasis. Electronically Signed   By: Ulyses Jarred M.D.   On: 06/05/2020 02:30   US THORACENTESIS ASP PLEURAL SPACE W/IMG GUIDE  Result Date: 06/27/2020 INDICATION: Symptomatic left sided pleural effusion. Please perform ultrasound-guided thoracentesis for diagnostic and therapeutic purposes. EXAM: US THORACENTESIS ASP PLEURAL SPACE  W/IMG GUIDE COMPARISON:  Chest radiograph-06/26/2020; chest CT-05/30/2020 MEDICATIONS: None. COMPLICATIONS: None immediate. TECHNIQUE: Informed written consent was obtained from the patient after a discussion of the risks, benefits and alternatives to treatment. A timeout was performed prior to the initiation of the procedure. Initial ultrasound scanning demonstrates a moderate to large sized predominantly anechoic left-sided pleural effusion. The lower chest was prepped and draped in the usual sterile fashion. 1% lidocaine was used for local anesthesia. An ultrasound image was saved for documentation purposes. An 8 Fr Safe-T-Centesis catheter was introduced. The thoracentesis was performed. The catheter was removed and a dressing was applied. The patient tolerated the procedure well without immediate post procedural complication. The patient was escorted to have an upright chest radiograph.  FINDINGS: A total of approximately 900 cc of slightly blood tinged serous fluid was removed. Requested samples were sent to the laboratory. IMPRESSION: Successful ultrasound-guided left sided thoracentesis yielding 900 cc of pleural fluid. Electronically Signed   By: Sandi Mariscal M.D.   On: 06/27/2020 12:56          ASSESSMENT/PLAN     Exudative moderate to large left pleural effusion  -Specimen with component of contamination from blood per report fluid is grossly red turbid in appearance which will falsely give exudative neutrophilic predominant result -Reassuring findings include normal glucose and negative cytology at this time -There is absence of leukocytosis which also points away from parapneumonic/empyema related pleural fluid as etiology -Additional studies including flow cytometry, fungal culture, cholesterol in process -Fluid culture is negative to date -despite absence of peripheral edema, will obtain BNP and TTE for cardiac dysfunction. -Renal and hepatic function within reference range 24h ago -Gentle diuresis today with repeat CXR in 24h  Acute on chronic hypoxemic respiratory failure Partially related to COPD   - agree with empiric abx and steroids  -will obtain MRSA nasal , Procalcitonin and non-COVID resp viral panel to elucidate any infective potential as culprit for AECOPD. -COPD BODE index >7  -respiratory sputum cultures   Severe protein calorie malnutrition -patient with peripheral and bitemporal muscular wasting -albumin is low -this can also cause patient to develop pleural effusion and I will place consultation for RD dietary evaluation today -poor prongnostic finding long term          -contuing megace , Adding Marinol 5mg  po BID         -appreciate palliative care input  Metastatic small cell lung cancer   - new diagnosis and may be contributing to current exacerbation as mediastinal mass effect is singificant - s/p oncology and radiation oncology  evaluation - plan for palliative radiation             -severe nausea and pain - morphine already ordered, will add bid compazine    Anxiety disorder NOS   - complicating respiratory status    - patient reports episodes of breathlessness and feelings of suffocation and drowing due to advanced chronic lung disease.    - she appreciates and responds well do TID PRN ativan 1mg .         Thank you for allowing me to participate in the care of this patient.    Patient/Family are satisfied with care plan and all questions have been answered.  This document was prepared using Dragon voice recognition software and may include unintentional dictation errors.     Ottie Glazier, M.D.  Division of [place]

## 2020-07-02 DIAGNOSIS — C3411 Malignant neoplasm of upper lobe, right bronchus or lung: Secondary | ICD-10-CM | POA: Diagnosis not present

## 2020-07-02 DIAGNOSIS — J441 Chronic obstructive pulmonary disease with (acute) exacerbation: Secondary | ICD-10-CM | POA: Diagnosis not present

## 2020-07-02 DIAGNOSIS — J9621 Acute and chronic respiratory failure with hypoxia: Secondary | ICD-10-CM | POA: Diagnosis not present

## 2020-07-02 LAB — COMP PANEL: LEUKEMIA/LYMPHOMA

## 2020-07-02 LAB — CHOLESTEROL, BODY FLUID: Cholesterol, Fluid: 59 mg/dL

## 2020-07-02 MED ORDER — SODIUM CHLORIDE 0.9% FLUSH
10.0000 mL | Freq: Two times a day (BID) | INTRAVENOUS | Status: DC
  Administered 2020-07-02 – 2020-07-04 (×4): 10 mL via INTRAVENOUS

## 2020-07-02 MED ORDER — POTASSIUM CHLORIDE CRYS ER 20 MEQ PO TBCR
40.0000 meq | EXTENDED_RELEASE_TABLET | ORAL | Status: AC
  Administered 2020-07-02 (×2): 40 meq via ORAL
  Filled 2020-07-02 (×2): qty 2

## 2020-07-02 NOTE — Progress Notes (Signed)
Progress Note    [name]  QBH:[id number] DOB: 1940-08-22  DOA: 06/26/2020 PCP: Chase Picket, MD      Brief Narrative:    Medical records reviewed and are as summarized below:  Christine Morris is a 80 y.o. 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  (Underweight)    Acute on chronic hypoxic respiratory failure: Oxygen therapy has been tapered down to 6 L/min via nasal cannula.  Taper down oxygen as able.   Left pleural effusion: S/p left-sided thoracentesis with removal of 900 cc of red-tinged fluid on 06/27/2020.  Pleural fluid is exudative but negative for malignancy.    COPD with suspected exacerbation contributing to hypoxia: She completed steroids.  Continue bronchodilators and Augmentin.   Anemia of chronic disease, iron deficiency anemia, positive heme stools: s/p transfusion with 1 unit of PRBCs. She has been evaluated by the gastroenterologist.  No plan for immediate endoscopic work-up.  Continue ferrous sulfate.  Atrial fibrillation with RVR: She converted to normal sinus rhythm.  Continue Eliquis.  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    History of stroke, hypothyroidism and depression.  Patient said she will not go home until Wednesday because she does not want to go  home without any support from the hospice team.  She said hospice will not be available until Wednesday, 07/04/2020.   Diet Order            Diet regular Room service appropriate? Yes; Fluid consistency: Thin  Diet effective now                    Consultants:  Pulmonologist  Palliative care team  Hospice team  Procedures:  Left-sided thoracentesis with removal of 900 cc of fluid    Medications:   . amoxicillin-clavulanate  1 tablet Oral Q12H  . apixaban  5 mg Oral BID  . vitamin C  250 mg Oral BID  . dronabinol  5 mg Oral BID AC  . escitalopram  20 mg Oral Daily  . feeding supplement  237 mL Oral BID BM  . ferrous sulfate  325 mg Oral QODAY  . fluticasone furoate-vilanterol  1 puff Inhalation Daily  . levothyroxine  75 mcg Oral QAC breakfast  . montelukast  10 mg Oral BH-q7a  . multivitamin with minerals  1 tablet Oral Daily  . prochlorperazine  5 mg Oral BID  . roflumilast  250 mcg Oral Daily  . umeclidinium bromide  1 puff Inhalation Daily   Continuous Infusions:    Anti-infectives (From admission, onward)   Start     Dose/Rate Route Frequency Ordered Stop   07/01/20 1800  amoxicillin-clavulanate (AUGMENTIN) 875-125 MG per tablet 1 tablet        1 tablet Oral Every 12 hours 07/01/20 1402     06/28/20 1600  cefTRIAXone (ROCEPHIN) 1 g in sodium chloride 0.9 % 100 mL IVPB  Status:  Discontinued        1 g 200 mL/hr over 30 Minutes Intravenous Every 24 hours 06/28/20 1450 07/01/20 1400             Family Communication/Anticipated D/C date and plan/Code Status   DVT prophylaxis: Place TED hose Start: 06/26/20 2010     Code Status: DNR  Family Communication: With husband at bedside Disposition Plan:    Status is: Inpatient  Remains inpatient appropriate because:Inpatient level of care appropriate due to severity of illness   Dispo: The patient is from: Home              Anticipated d/c is to: Home              Anticipated d/c date is: 2  days              Patient currently is not medically stable to d/c.   Difficult to place patient No              Subjective:   Interval events noted.  She feels weak.  Her husband is at the bedside.   Objective:    Vitals:   07/01/20 1943 07/02/20 0418 07/02/20 0735 07/02/20 1146  BP: 113/72 111/68 106/65 113/70  Pulse: 71 90 71 (!) 110  Resp: 18 18 18 18   Temp: 98.2 F (36.8 C) (!) 97.5 F (36.4 C) 97.9 F (36.6 C) 98.1 F (36.7 C)  TempSrc:  Oral Oral Oral  SpO2: 99% 99% 96% 94%  Weight:  43.7 kg    Height:       No data found.   Intake/Output Summary (Last 24 hours) at 07/02/2020 1242 Last data filed at 07/02/2020 1149 Gross per 24 hour  Intake 720 ml  Output 1100 ml  Net -380 ml   Filed Weights   06/30/20 0227 06/30/20 0356 07/02/20 0418  Weight: 42.6 kg 43.6 kg 43.7 kg    Exam:   GEN: NAD SKIN: Multiple bruises on bilateral upper extremities EYES: No pallor or icterus ENT: MMM CV: RRR PULM: CTA B ABD: soft, ND, NT, +BS CNS: AAO x 3, non focal EXT: No edema or tenderness        Data Reviewed:   I have personally reviewed following labs and imaging studies:  Labs: Labs show the following:   Basic Metabolic Panel: Recent Labs  Lab 06/26/20 1634 06/27/20 0322 06/28/20 0538 06/29/20 0537 07/01/20 1453  NA 140 143 139  --  140  K 3.3* 3.9 4.0  --  3.4*  CL 99 101 99  --  93*  CO2 29 30 30   --  34*  GLUCOSE 135* 157* 111*  --  161*  BUN 19 25* 27*  --  30*  CREATININE 0.55 0.82 0.72  --  0.78  CALCIUM 9.5 9.4 9.0  --  9.4  MG  --  1.6* 2.0  --  2.2  PHOS  --   --  1.9* 2.8 2.7   GFR Estimated Creatinine Clearance: 40 mL/min (by C-G formula based on SCr of 0.78 mg/dL). Liver Function Tests: Recent Labs  Lab 06/26/20 1634  AST 20  ALT 8  ALKPHOS 32*  BILITOT 0.5  PROT 6.1*  ALBUMIN 2.4*   No results for input(s): LIPASE, AMYLASE in the last 168 hours. No results for input(s): AMMONIA in the last 168  hours. Coagulation profile No results for input(s):  INR, PROTIME in the last 168 hours.  CBC: Recent Labs  Lab 06/26/20 1634 06/27/20 0322 06/28/20 0538 06/29/20 0537 07/01/20 1453  WBC 15.7* 12.5* 8.6 8.7 14.2*  NEUTROABS  --   --  7.9* 7.7 13.6*  HGB 7.7* 8.2* 8.5* 8.4* 9.7*  HCT 26.1* 26.8* 27.4* 27.0* 31.3*  MCV 94.2 92.4 90.4 89.7 90.5  PLT 443* 379 356 392 481*   Cardiac Enzymes: No results for input(s): CKTOTAL, CKMB, CKMBINDEX, TROPONINI in the last 168 hours. BNP (last 3 results) No results for input(s): PROBNP in the last 8760 hours. CBG: No results for input(s): GLUCAP in the last 168 hours. D-Dimer: No results for input(s): DDIMER in the last 72 hours. Hgb A1c: No results for input(s): HGBA1C in the last 72 hours. Lipid Profile: No results for input(s): CHOL, HDL, LDLCALC, TRIG, CHOLHDL, LDLDIRECT in the last 72 hours. Thyroid function studies: No results for input(s): TSH, T4TOTAL, T3FREE, THYROIDAB in the last 72 hours.  Invalid input(s): FREET3 Anemia work up: No results for input(s): VITAMINB12, FOLATE, FERRITIN, TIBC, IRON, RETICCTPCT in the last 72 hours. Sepsis Labs: Recent Labs  Lab 06/27/20 0322 06/28/20 0538 06/29/20 0537 06/30/20 0405 07/01/20 0524 07/01/20 1453  PROCALCITON  --   --  0.60 0.21 <0.10  --   WBC 12.5* 8.6 8.7  --   --  14.2*    Microbiology Recent Results (from the past 240 hour(s))  SARS Coronavirus 2 by RT PCR (hospital order, performed in [facility] hospital lab) Nasopharyngeal Nasopharyngeal Swab     Status: None   Collection Time: 06/26/20  4:29 PM   Specimen: Nasopharyngeal Swab  Result Value Ref Range Status   SARS Coronavirus 2 NEGATIVE NEGATIVE Final    Comment: (NOTE) SARS-CoV-2 target nucleic acids are NOT DETECTED.  The SARS-CoV-2 RNA is generally detectable in upper and lower respiratory specimens during the acute phase of infection. The lowest concentration of SARS-CoV-2 viral copies this assay can  detect is 250 copies / mL. A negative result does not preclude SARS-CoV-2 infection and should not be used as the sole basis for treatment or other patient management decisions.  A negative result may occur with improper specimen collection / handling, submission of specimen other than nasopharyngeal swab, presence of viral mutation(s) within the areas targeted by this assay, and inadequate number of viral copies (<250 copies / mL). A negative result must be combined with clinical observations, patient history, and epidemiological information.  Fact Sheet for Patients:   StrictlyIdeas.no  Fact Sheet for Healthcare Providers: BankingDealers.co.za  This test is not yet approved or  cleared by the [place] FDA and has been authorized for detection and/or diagnosis of SARS-CoV-2 by FDA under an Emergency Use Authorization (EUA).  This EUA will remain in effect (meaning this test can be used) for the duration of the COVID-19 declaration under Section 564(b)(1) of the Act, 21 U.S.C. section 360bbb-3(b)(1), unless the authorization is terminated or revoked sooner.  Performed at [facility], [address] [place], [place] [address]   Body fluid culture     Status: None   Collection Time: 06/27/20 11:38 AM   Specimen: PATH Cytology Pleural fluid  Result Value Ref Range Status   Specimen Description   Final    PLEURAL Performed at [facility], [address] [place], [place] [address]    Special Requests   Final    NONE Performed at [facility], [address] [place], [place] [address]  Gram Stain   Final    MODERATE WBC PRESENT, PREDOMINANTLY PMN NO ORGANISMS SEEN    Culture   Final    NO GROWTH Performed at [facility], [place] [address] [place], [place] [address]    Report Status 06/30/2020 FINAL  Final  Fungus Culture With Stain     Status: None (Preliminary result)   Collection  Time: 06/27/20 11:38 AM   Specimen: PATH Cytology Pleural fluid  Result Value Ref Range Status   Fungus Stain Final report  Final    Comment: (NOTE) Performed At: [facility] [place], [place] [id number] Rush Farmer MD ER:[id number]    Fungus (Mycology) Culture PENDING  Incomplete   Fungal Source PLEURAL  Final    Comment: Performed at [facility], [address] [place], [place] [address]  Acid Fast Smear (AFB)     Status: None   Collection Time: 06/27/20 11:38 AM   Specimen: PATH Cytology Pleural fluid  Result Value Ref Range Status   AFB Specimen Processing Concentration  Final   Acid Fast Smear Negative  Final    Comment: (NOTE) Performed At: [facility] [place], [place] [id number] Rush Farmer MD YI:[id number]    Source (AFB) PLEURAL  Final    Comment: Performed at [facility], [address] [place], [place] [address]  Fungus Culture Result     Status: None   Collection Time: 06/27/20 11:38 AM  Result Value Ref Range Status   Result 1 Comment  Final    Comment: (NOTE) KOH/Calcofluor preparation:  no fungus observed. Performed At: [facility] [place], [place] [id number] Rush Farmer MD EZ:[id number]   MRSA PCR Screening     Status: None   Collection Time: 06/29/20  9:28 AM   Specimen: Nasopharyngeal  Result Value Ref Range Status   MRSA by PCR NEGATIVE NEGATIVE Final    Comment:        The GeneXpert MRSA Assay (FDA approved for NASAL specimens only), is one component of a comprehensive MRSA colonization surveillance program. It is not intended to diagnose MRSA infection nor to guide or monitor treatment for MRSA infections. Performed at [facility], [place], [place] [address]   Respiratory (~20 pathogens) panel by PCR     Status: None   Collection Time: 06/29/20  9:28 AM   Specimen: Nasopharyngeal Swab; Respiratory  Result Value  Ref Range Status   Adenovirus NOT DETECTED NOT DETECTED Final   Coronavirus 229E NOT DETECTED NOT DETECTED Final    Comment: (NOTE) The Coronavirus on the Respiratory Panel, DOES NOT test for the novel  Coronavirus (2019 nCoV)    Coronavirus HKU1 NOT DETECTED NOT DETECTED Final   Coronavirus NL63 NOT DETECTED NOT DETECTED Final   Coronavirus OC43 NOT DETECTED NOT DETECTED Final   Metapneumovirus NOT DETECTED NOT DETECTED Final   Rhinovirus / Enterovirus NOT DETECTED NOT DETECTED Final   Influenza A NOT DETECTED NOT DETECTED Final   Influenza B NOT DETECTED NOT DETECTED Final   Parainfluenza Virus 1 NOT DETECTED NOT DETECTED Final   Parainfluenza Virus 2 NOT DETECTED NOT DETECTED Final   Parainfluenza Virus 3 NOT DETECTED NOT DETECTED Final   Parainfluenza Virus 4 NOT DETECTED NOT DETECTED Final   Respiratory Syncytial Virus NOT DETECTED NOT DETECTED Final   Bordetella pertussis NOT DETECTED NOT DETECTED Final   Bordetella Parapertussis NOT DETECTED NOT DETECTED Final   Chlamydophila pneumoniae NOT DETECTED NOT  DETECTED Final   Mycoplasma pneumoniae NOT DETECTED NOT DETECTED Final    Comment: Performed at [facility], [place] [address] [place], [place] [address]    Procedures and diagnostic studies:  No results found.             LOS: 5 days   Aayana Reinertsen  [facility]   Pager on [email or web address]. If 7PM-7AM, please contact night-coverage at [email or web address]     07/02/2020, 12:42 PM

## 2020-07-02 NOTE — Progress Notes (Signed)
[facility]) Hospital Liaison RN note:  Spoke with daughter, [name] over the phone to provide update and answer any questions. All questions were answered. Per [name], patient may discharge home Wednesday. Discussed oxygen needs and DME will need to be arranged prior to discharge. Hospital care team is aware.   Please call with any hospice related questions or concerns.  Zandra Abts, RN [facility] Liaison [phone]

## 2020-07-02 NOTE — Progress Notes (Signed)
Mobility Specialist - Progress Note   07/02/20 1300  Mobility  Activity Dangled on edge of bed  Range of Motion/Exercises Right leg;Left leg (AP, SLR, ABD, HS)  Level of Assistance Moderate assist, patient does 50-74%  Assistive Device None  Distance Ambulated (ft) 0 ft  Mobility Response Tolerated well  Mobility performed by Mobility specialist  $Mobility charge 1 Mobility    Pre-mobility: 74 HR, 88% SpO2 During mobility: 105 HR, 91% SpO2 Post-mobility: 102 HR, 96% SpO2   Pt was lying in bed upon arrival utilizing 6L HFNC. Pt agreed to session. Family present in room. Pt's O2 sats at 88% on arrival and pt was educated on PLB to increase sats to 93%. Pt denied pain, nausea, and fatigue. Pt able to get EOB with minA with mild dizziness upon sitting. OOB activity deferred this date d/t no RW available in room. Pt dangled EOB for 3 minutes with minA for sitting-balance. O2 desat to 89% while EOB. Pt returned to supine and O2 sat up and maintained > 93% throughout remainder of session. Pt performed bed-level therex: ankle pumps x10, straight leg raises x15, abduction x10, and heel slides x5 independently. Pt carries over commands well. Pt was encouraged to continue exercises in between sessions to regain/retain strength. Pt showed understanding. Overall, pt tolerated session well. Pt was left in bed with all needs in reach and alarm set.    Kathee Delton Mobility Specialist 07/02/20, 2:02 PM

## 2020-07-02 NOTE — Care Management Important Message (Signed)
Important Message  Patient Details  Name: DEOLINDA FRID MRN: [id number] Date of Birth: 01-16-1941   Medicare Important Message Given:  Yes     Dannette Barbara 07/02/2020, 11:13 AM

## 2020-07-02 NOTE — Progress Notes (Signed)
   07/01/20 1123  Assess: MEWS Score  Temp 97.9 F (36.6 C)  BP 93/76  Pulse Rate (!) 136  Resp 16  SpO2 97 %  Assess: MEWS Score  MEWS Temp 0  MEWS Systolic 1  MEWS Pulse 3  MEWS RR 0  MEWS LOC 0  MEWS Score 4  MEWS Score Color Red  Assess: if the MEWS score is Yellow or Red  Were vital signs taken at a resting state? Yes  Focused Assessment Change from prior assessment (see assessment flowsheet)  Early Detection of Sepsis Score *See Row Information* Low  MEWS guidelines implemented *See Row Information* Yes  Treat  MEWS Interventions Administered prn meds/treatments  Pain Scale 0-10  Pain Score 0  Take Vital Signs  Increase Vital Sign Frequency  Red: Q 1hr X 4 then Q 4hr X 4, if remains red, continue Q 4hrs  Escalate  MEWS: Escalate Red: discuss with charge nurse/RN and provider, consider discussing with RRT  Notify: Charge Nurse/RN  Name of Charge Nurse/RN Notified Janett Billow Christmas  Date Charge Nurse/RN Notified 07/01/20  Time Charge Nurse/RN Notified 6073  Notify: Provider  Provider Name/Title Ayiku  Date Provider Notified 07/01/20  Time Provider Notified 1123  Notification Type Face-to-face  Notification Reason Change in status  Response No new orders (pts family to meet today - decsion to make comfort care)  Date of Provider Response 07/01/20  Time of Provider Response 1123  Notify: Rapid Response  Name of Rapid Response RN Notified Katie  Date Rapid Response Notified 07/01/20  Time Rapid Response Notified 1200  Document  Patient Outcome Stabilized after interventions  Inserted for [employer] RN

## 2020-07-03 DIAGNOSIS — C3411 Malignant neoplasm of upper lobe, right bronchus or lung: Secondary | ICD-10-CM | POA: Diagnosis not present

## 2020-07-03 DIAGNOSIS — J441 Chronic obstructive pulmonary disease with (acute) exacerbation: Secondary | ICD-10-CM | POA: Diagnosis not present

## 2020-07-03 DIAGNOSIS — J9621 Acute and chronic respiratory failure with hypoxia: Secondary | ICD-10-CM | POA: Diagnosis not present

## 2020-07-03 LAB — POTASSIUM: Potassium: 3.4 mmol/L — ABNORMAL LOW (ref 3.5–5.1)

## 2020-07-03 MED ORDER — POTASSIUM CHLORIDE CRYS ER 20 MEQ PO TBCR
40.0000 meq | EXTENDED_RELEASE_TABLET | ORAL | Status: AC
  Administered 2020-07-03 (×2): 40 meq via ORAL
  Filled 2020-07-03 (×2): qty 2

## 2020-07-03 NOTE — Progress Notes (Incomplete)
Patient's heart rate increased to 160's. Patient is asymptomatic. Patient blood pressure is WNL. No complaints of pain. Administered 5mg  of iv metoprolol.

## 2020-07-03 NOTE — Progress Notes (Signed)
[facility]) Hospital Liaison RN note:  Visited with patient at bedside. She was alert and oriented. Had no complaints or questions. Spoke with patient's daughter, [name] to provide update and answer any questions. Plan is to discharge home tomorrow once O2 has been delivered. [facility] will follow to verify times and update hospital care team.  Please send signed and completed DNR home with patient. Please provided prescriptions at discharge as needed to ensure ongoing symptom management.  Please call with any hospice related question or concerns.  Zandra Abts, RN [facility] Liaison [phone]

## 2020-07-03 NOTE — Progress Notes (Signed)
Progress Note    Christine Morris  JJK:[id number] DOB: 01-14-1941  DOA: 06/26/2020 PCP: Chase Picket, MD      Brief Narrative:    Medical records reviewed and are as summarized below:  [name] is a 80 y.o. female       Assessment/Plan:   Principal Problem:   Acute on chronic respiratory failure ([place]) Active Problems:   COPD (chronic obstructive pulmonary disease) with acute bronchitis (HCC)   Anxiety and depression   Lung cancer ([place])   COPD with acute exacerbation (HCC)   Pleural effusion on left   Acute on chronic respiratory failure with hypoxia ([place])   Palliative care encounter   Gastrointestinal hemorrhage   Small cell lung cancer ([place])   Goals of care, counseling/discussion   Protein-calorie malnutrition, severe   Body mass index is 16.47 kg/m.  (Underweight)    Acute on chronic hypoxic respiratory failure: She remains on 6 L/min oxygen via HFNC.  Taper down oxygen as able.  Left pleural effusion: S/p left-sided thoracentesis with removal of 900 cc of red-tinged fluid on 06/27/2020.  Pleural fluid is exudative but negative for malignancy.    COPD with suspected exacerbation contributing to hypoxia: She completed steroids and Augmentin.  Could consider bronchodilators.  Anemia of chronic disease, iron deficiency anemia, positive heme stools: s/p transfusion with 1 unit of PRBCs. She has been evaluated by the gastroenterologist.  No plan for immediate endoscopic work-up.  Continue ferrous sulfate.  Atrial fibrillation with RVR: She converted to normal sinus rhythm.  Continue Eliquis.  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    History of stroke, hypothyroidism and depression.  Plan to discharge home tomorrow with hospice.  She said hospice will not be available until  tomorrow   Diet Order            Diet regular Room service appropriate? Yes; Fluid consistency: Thin  Diet effective now                    Consultants:  Pulmonologist  Palliative care team  Hospice team  Procedures:  Left-sided thoracentesis with removal of 900 cc of fluid    Medications:   . amoxicillin-clavulanate  1 tablet Oral Q12H  . apixaban  5 mg Oral BID  . vitamin C  250 mg Oral BID  . dronabinol  5 mg Oral BID AC  . escitalopram  20 mg Oral Daily  . feeding supplement  237 mL Oral BID BM  . ferrous sulfate  325 mg Oral QODAY  . fluticasone furoate-vilanterol  1 puff Inhalation Daily  . levothyroxine  75 mcg Oral QAC breakfast  . montelukast  10 mg Oral BH-q7a  . multivitamin with minerals  1 tablet Oral Daily  . potassium chloride  40 mEq Oral Q4H  . prochlorperazine  5 mg Oral BID  . roflumilast  250 mcg Oral Daily  . sodium chloride flush  10 mL Intravenous Q12H  . umeclidinium bromide  1 puff Inhalation Daily   Continuous Infusions:    Anti-infectives (From admission, onward)   Start     Dose/Rate Route Frequency Ordered Stop   07/01/20 1800  amoxicillin-clavulanate (AUGMENTIN) 875-125 MG per tablet 1 tablet        1 tablet Oral Every 12 hours 07/01/20 1402     06/28/20 1600  cefTRIAXone (ROCEPHIN) 1 g  in sodium chloride 0.9 % 100 mL IVPB  Status:  Discontinued        1 g 200 mL/hr over 30 Minutes Intravenous Every 24 hours 06/28/20 1450 07/01/20 1400             Family Communication/Anticipated D/C date and plan/Code Status   DVT prophylaxis: Place TED hose Start: 06/26/20 2010     Code Status: DNR  Family Communication: With husband at bedside Disposition Plan:    Status is: Inpatient  Remains inpatient appropriate because:Inpatient level of care appropriate due to severity of illness   Dispo: The patient is from: Home              Anticipated d/c is to: Home              Anticipated d/c date is: 1 day               Patient currently is not medically stable to d/c.   Difficult to place patient No              Subjective:   She feels short of breath with exertion but she is okay at rest.   Objective:    Vitals:   07/03/20 0117 07/03/20 0304 07/03/20 0735 07/03/20 1128  BP: 104/65 113/88 106/60 109/70  Pulse: 77 72 92 88  Resp:  17 18 19   Temp:  97.8 F (36.6 C) 98 F (36.7 C) 97.9 F (36.6 C)  TempSrc:  Oral Oral Oral  SpO2:  98% 91% 91%  Weight:  42.2 kg    Height:       No data found.   Intake/Output Summary (Last 24 hours) at 07/03/2020 1227 Last data filed at 07/03/2020 1128 Gross per 24 hour  Intake 240 ml  Output 1000 ml  Net -760 ml   Filed Weights   06/30/20 0356 07/02/20 0418 07/03/20 0304  Weight: 43.6 kg 43.7 kg 42.2 kg    Exam:   GEN: NAD SKIN: Bruises on bilateral upper extremities EYES: No pallor or icterus ENT: MMM CV: RRR PULM: CTA B ABD: soft, ND, NT, +BS CNS: AAO x 3, non focal EXT: No edema or tenderness          Data Reviewed:   I have personally reviewed following labs and imaging studies:  Labs: Labs show the following:   Basic Metabolic Panel: Recent Labs  Lab 06/26/20 1634 06/27/20 0322 06/28/20 0538 06/29/20 0537 07/01/20 1453 07/03/20 0459  NA 140 143 139  --  140  --   K 3.3* 3.9 4.0  --  3.4* 3.4*  CL 99 101 99  --  93*  --   CO2 29 30 30   --  34*  --   GLUCOSE 135* 157* 111*  --  161*  --   BUN 19 25* 27*  --  30*  --   CREATININE 0.55 0.82 0.72  --  0.78  --   CALCIUM 9.5 9.4 9.0  --  9.4  --   MG  --  1.6* 2.0  --  2.2  --   PHOS  --   --  1.9* 2.8 2.7  --    GFR Estimated Creatinine Clearance: 38.6 mL/min (by C-G formula based on SCr of 0.78 mg/dL). Liver Function Tests: Recent Labs  Lab 06/26/20 1634  AST 20  ALT 8  ALKPHOS 32*  BILITOT 0.5  PROT 6.1*  ALBUMIN 2.4*   No results for input(s): LIPASE, AMYLASE in  the last 168 hours. No results for input(s): AMMONIA in the last 168  hours. Coagulation profile No results for input(s): INR, PROTIME in the last 168 hours.  CBC: Recent Labs  Lab 06/26/20 1634 06/27/20 0322 06/28/20 0538 06/29/20 0537 07/01/20 1453  WBC 15.7* 12.5* 8.6 8.7 14.2*  NEUTROABS  --   --  7.9* 7.7 13.6*  HGB 7.7* 8.2* 8.5* 8.4* 9.7*  HCT 26.1* 26.8* 27.4* 27.0* 31.3*  MCV 94.2 92.4 90.4 89.7 90.5  PLT 443* 379 356 392 481*   Cardiac Enzymes: No results for input(s): CKTOTAL, CKMB, CKMBINDEX, TROPONINI in the last 168 hours. BNP (last 3 results) No results for input(s): PROBNP in the last 8760 hours. CBG: No results for input(s): GLUCAP in the last 168 hours. D-Dimer: No results for input(s): DDIMER in the last 72 hours. Hgb A1c: No results for input(s): HGBA1C in the last 72 hours. Lipid Profile: No results for input(s): CHOL, HDL, LDLCALC, TRIG, CHOLHDL, LDLDIRECT in the last 72 hours. Thyroid function studies: No results for input(s): TSH, T4TOTAL, T3FREE, THYROIDAB in the last 72 hours.  Invalid input(s): FREET3 Anemia work up: No results for input(s): VITAMINB12, FOLATE, FERRITIN, TIBC, IRON, RETICCTPCT in the last 72 hours. Sepsis Labs: Recent Labs  Lab 06/27/20 0322 06/28/20 0538 06/29/20 0537 06/30/20 0405 07/01/20 0524 07/01/20 1453  PROCALCITON  --   --  0.60 0.21 <0.10  --   WBC 12.5* 8.6 8.7  --   --  14.2*    Microbiology Recent Results (from the past 240 hour(s))  SARS Coronavirus 2 by RT PCR (hospital order, performed in [facility] hospital lab) Nasopharyngeal Nasopharyngeal Swab     Status: None   Collection Time: 06/26/20  4:29 PM   Specimen: Nasopharyngeal Swab  Result Value Ref Range Status   SARS Coronavirus 2 NEGATIVE NEGATIVE Final    Comment: (NOTE) SARS-CoV-2 target nucleic acids are NOT DETECTED.  The SARS-CoV-2 RNA is generally detectable in upper and lower respiratory specimens during the acute phase of infection. The lowest concentration of SARS-CoV-2 viral copies this assay can  detect is 250 copies / mL. A negative result does not preclude SARS-CoV-2 infection and should not be used as the sole basis for treatment or other patient management decisions.  A negative result may occur with improper specimen collection / handling, submission of specimen other than nasopharyngeal swab, presence of viral mutation(s) within the areas targeted by this assay, and inadequate number of viral copies (<250 copies / mL). A negative result must be combined with clinical observations, patient history, and epidemiological information.  Fact Sheet for Patients:   StrictlyIdeas.no  Fact Sheet for Healthcare Providers: BankingDealers.co.za  This test is not yet approved or  cleared by the [place] FDA and has been authorized for detection and/or diagnosis of SARS-CoV-2 by FDA under an Emergency Use Authorization (EUA).  This EUA will remain in effect (meaning this test can be used) for the duration of the COVID-19 declaration under Section 564(b)(1) of the Act, 21 U.S.C. section 360bbb-3(b)(1), unless the authorization is terminated or revoked sooner.  Performed at [facility], [address] [place], [place] [address]   Body fluid culture     Status: None   Collection Time: 06/27/20 11:38 AM   Specimen: PATH Cytology Pleural fluid  Result Value Ref Range Status   Specimen Description   Final    PLEURAL Performed at [facility], [address], [place], [place] [address]    Special Requests  Final    NONE Performed at [facility], [address] [place], [place] [address]    Gram Stain   Final    MODERATE WBC PRESENT, PREDOMINANTLY PMN NO ORGANISMS SEEN    Culture   Final    NO GROWTH Performed at [facility], [place] [address], [place], [place] [address]    Report Status 06/30/2020 FINAL  Final  Fungus Culture With Stain     Status: None (Preliminary result)   Collection  Time: 06/27/20 11:38 AM   Specimen: PATH Cytology Pleural fluid  Result Value Ref Range Status   Fungus Stain Final report  Final    Comment: (NOTE) Performed At: [facility] [place], [place] [id number] Rush Farmer MD [id number]    Fungus (Mycology) Culture PENDING  Incomplete   Fungal Source PLEURAL  Final    Comment: Performed at [facility], [address] [place], [place] [address]  Acid Fast Smear (AFB)     Status: None   Collection Time: 06/27/20 11:38 AM   Specimen: PATH Cytology Pleural fluid  Result Value Ref Range Status   AFB Specimen Processing Concentration  Final   Acid Fast Smear Negative  Final    Comment: (NOTE) Performed At: [facility] [place], [place] [id number] Rush Farmer MD [id number]    Source (AFB) PLEURAL  Final    Comment: Performed at [facility], [address] [place], [place] [address]  Fungus Culture Result     Status: None   Collection Time: 06/27/20 11:38 AM  Result Value Ref Range Status   Result 1 Comment  Final    Comment: (NOTE) KOH/Calcofluor preparation:  no fungus observed. Performed At: [facility] [place], [place] [id number] Rush Farmer MD [id number]   MRSA PCR Screening     Status: None   Collection Time: 06/29/20  9:28 AM   Specimen: Nasopharyngeal  Result Value Ref Range Status   MRSA by PCR NEGATIVE NEGATIVE Final    Comment:        The GeneXpert MRSA Assay (FDA approved for NASAL specimens only), is one component of a comprehensive MRSA colonization surveillance program. It is not intended to diagnose MRSA infection nor to guide or monitor treatment for MRSA infections. Performed at [facility], [place], [place] [address]   Respiratory (~20 pathogens) panel by PCR     Status: None   Collection Time: 06/29/20  9:28 AM   Specimen: Nasopharyngeal Swab; Respiratory  Result Value  Ref Range Status   Adenovirus NOT DETECTED NOT DETECTED Final   Coronavirus 229E NOT DETECTED NOT DETECTED Final    Comment: (NOTE) The Coronavirus on the Respiratory Panel, DOES NOT test for the novel  Coronavirus (2019 nCoV)    Coronavirus HKU1 NOT DETECTED NOT DETECTED Final   Coronavirus NL63 NOT DETECTED NOT DETECTED Final   Coronavirus OC43 NOT DETECTED NOT DETECTED Final   Metapneumovirus NOT DETECTED NOT DETECTED Final   Rhinovirus / Enterovirus NOT DETECTED NOT DETECTED Final   Influenza A NOT DETECTED NOT DETECTED Final   Influenza B NOT DETECTED NOT DETECTED Final   Parainfluenza Virus 1 NOT DETECTED NOT DETECTED Final   Parainfluenza Virus 2 NOT DETECTED NOT DETECTED Final   Parainfluenza Virus 3 NOT DETECTED NOT DETECTED Final   Parainfluenza Virus 4 NOT DETECTED NOT DETECTED Final   Respiratory Syncytial Virus NOT DETECTED NOT DETECTED Final   Bordetella pertussis NOT  DETECTED NOT DETECTED Final   Bordetella Parapertussis NOT DETECTED NOT DETECTED Final   Chlamydophila pneumoniae NOT DETECTED NOT DETECTED Final   Mycoplasma pneumoniae NOT DETECTED NOT DETECTED Final    Comment: Performed at [facility], [place] [address] [place], [place] [address]    Procedures and diagnostic studies:  No results found.             LOS: 6 days   Hermilo Dutter  [facility]   Pager on [email or web address]. If 7PM-7AM, please contact night-coverage at [email or web address]     07/03/2020, 12:27 PM

## 2020-07-03 NOTE — Progress Notes (Signed)
Patient arrived to [facility], room 252B at this time.

## 2020-07-04 DIAGNOSIS — J9621 Acute and chronic respiratory failure with hypoxia: Secondary | ICD-10-CM | POA: Diagnosis not present

## 2020-07-04 DIAGNOSIS — J441 Chronic obstructive pulmonary disease with (acute) exacerbation: Secondary | ICD-10-CM | POA: Diagnosis not present

## 2020-07-04 DIAGNOSIS — E43 Unspecified severe protein-calorie malnutrition: Secondary | ICD-10-CM | POA: Diagnosis not present

## 2020-07-04 DIAGNOSIS — J90 Pleural effusion, not elsewhere classified: Secondary | ICD-10-CM | POA: Diagnosis not present

## 2020-07-04 MED ORDER — DRONABINOL 5 MG PO CAPS: 5.0000 mg | ORAL_CAPSULE | Freq: Two times a day (BID) | ORAL | 0 refills | Status: AC

## 2020-07-04 NOTE — Discharge Summary (Signed)
Physician Discharge Summary  Christine Morris MBW:[id number] DOB: 1940-11-07 DOA: 06/26/2020  PCP: Chase Picket, MD  Admit date: 06/26/2020 Discharge date: 07/04/2020  Discharge disposition: Home with hospice   Recommendations for Outpatient Follow-Up:   1. Follow up with hospice team at home   Discharge Diagnosis:   Principal Problem:   Acute on chronic respiratory failure (HCC) Active Problems:   COPD (chronic obstructive pulmonary disease) with acute bronchitis (HCC)   Anxiety and depression   Lung cancer (HCC)   COPD with acute exacerbation (HCC)   Pleural effusion on left   Acute on chronic respiratory failure with hypoxia (HCC)   Palliative care encounter   Gastrointestinal hemorrhage   Small cell lung cancer ([place])   Goals of care, counseling/discussion   Protein-calorie malnutrition, severe    Discharge Condition: Stable.  Diet recommendation:  Diet Order            Diet regular           Diet regular Room service appropriate? Yes; Fluid consistency: Thin  Diet effective now                   Code Status: DNR     Hospital Course:   Ms. 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  Work-up revealed moderate left pleural effusion, positive heme stools and acute on chronic anemia.  She underwent left-sided thoracentesis with removal of 900 cc of exudative fluid.  She was also found to have acute hypoxic respiratory failure and COPD exacerbation.  She was transfused with 1 unit of packed red blood cells.  She was treated with steroids, empiric antibiotics and bronchodilators.  She was  treated with oxygen via heated humidified high flow nasal cannula for severe hypoxia.  Gastroenterologist was consulted for occult GI bleeding and anemia. Conservative measures were recommended.  Pulmonologist was consulted because of lung cancer and pleural effusion.  Her condition slowly improved and oxygen was weaned down to 3 L/min by nasal cannula.  Patient and her family opted for comfort measures with hospice at home.  She is deemed stable for discharge to home with hospice.       Discharge Exam:    Vitals:   07/03/20 2333 07/04/20 0425 07/04/20 0753 07/04/20 0806  BP: 106/71 107/79 138/74   Pulse: 91 74 84   Resp: _0 Temp: 98.3 F (36.8 C) 97.9 F (36.6 C)    TempSrc: Oral Oral    SpO2: 99% 100% 99% 95%  Weight:      Height:         GEN: NAD SKIN: Bruises on bilateral upper extremities EYES: No pallor, Anicteric ENT: MMM CV: RRR PULM: CTA B ABD: soft, ND, NT, +BS CNS: AAO x 3, non focal EXT: No edema or tenderness   The results of significant diagnostics from this hospitalization (including imaging, microbiology, ancillary and laboratory) are listed below for reference.     Procedures and Diagnostic Studies:   No results found.   Labs:   Basic Metabolic Panel: Recent Labs  Lab 06/28/20 0538 06/29/20 0537 07/01/20 1453 07/03/20 0459  NA 139  --  140  --   K  4.0  --  3.4* 3.4*  CL 99  --  93*  --   CO2 30  --  34*  --   GLUCOSE 111*  --  161*  --   BUN 27*  --  30*  --   CREATININE 0.72  --  0.78  --   CALCIUM 9.0  --  9.4  --   MG 2.0  --  2.2  --   PHOS 1.9* 2.8 2.7  --    GFR Estimated Creatinine Clearance: 38.6 mL/min (by C-G formula based on SCr of 0.78 mg/dL). Liver Function Tests: No results for input(s): AST, ALT, ALKPHOS, BILITOT, PROT, ALBUMIN in the last 168 hours. No results for input(s): LIPASE, AMYLASE in the last 168 hours. No results for input(s): AMMONIA in the last 168 hours. Coagulation profile No results for  input(s): INR, PROTIME in the last 168 hours.  CBC: Recent Labs  Lab 06/28/20 0538 06/29/20 0537 07/01/20 1453  WBC 8.6 8.7 14.2*  NEUTROABS 7.9* 7.7 13.6*  HGB 8.5* 8.4* 9.7*  HCT 27.4* 27.0* 31.3*  MCV 90.4 89.7 90.5  PLT 356 392 481*   Cardiac Enzymes: No results for input(s): CKTOTAL, CKMB, CKMBINDEX, TROPONINI in the last 168 hours. BNP: Invalid input(s): POCBNP CBG: No results for input(s): GLUCAP in the last 168 hours. D-Dimer No results for input(s): DDIMER in the last 72 hours. Hgb A1c No results for input(s): HGBA1C in the last 72 hours. Lipid Profile No results for input(s): CHOL, HDL, LDLCALC, TRIG, CHOLHDL, LDLDIRECT in the last 72 hours. Thyroid function studies No results for input(s): TSH, T4TOTAL, T3FREE, THYROIDAB in the last 72 hours.  Invalid input(s): FREET3 Anemia work up No results for input(s): VITAMINB12, FOLATE, FERRITIN, TIBC, IRON, RETICCTPCT in the last 72 hours. Microbiology Recent Results (from the past 240 hour(s))  SARS Coronavirus 2 by RT PCR (hospital order, performed in [facility] hospital lab) Nasopharyngeal Nasopharyngeal Swab     Status: None   Collection Time: 06/26/20  4:29 PM   Specimen: Nasopharyngeal Swab  Result Value Ref Range Status   SARS Coronavirus 2 NEGATIVE NEGATIVE Final    Comment: (NOTE) SARS-CoV-2 target nucleic acids are NOT DETECTED.  The SARS-CoV-2 RNA is generally detectable in upper and lower respiratory specimens during the acute phase of infection. The lowest concentration of SARS-CoV-2 viral copies this assay can detect is 250 copies / mL. A negative result does not preclude SARS-CoV-2 infection and should not be used as the sole basis for treatment or other patient management decisions.  A negative result may occur with improper specimen collection / handling, submission of specimen other than nasopharyngeal swab, presence of viral mutation(s) within the areas targeted by this assay, and inadequate  number of viral copies (<250 copies / mL). A negative result must be combined with clinical observations, patient history, and epidemiological information.  Fact Sheet for Patients:   StrictlyIdeas.no  Fact Sheet for Healthcare Providers: BankingDealers.co.za  This test is not yet approved or  cleared by the [place] FDA and has been authorized for detection and/or diagnosis of SARS-CoV-2 by FDA under an Emergency Use Authorization (EUA).  This EUA will remain in effect (meaning this test can be used) for the duration of the COVID-19 declaration under Section 564(b)(1) of the Act, 21 U.S.C. section 360bbb-3(b)(1), unless the authorization is terminated or revoked sooner.  Performed at [facility], [address], [place], [place] [address]   Body fluid culture     Status:  None   Collection Time: 06/27/20 11:38 AM   Specimen: PATH Cytology Pleural fluid  Result Value Ref Range Status   Specimen Description   Final    PLEURAL Performed at [facility], [address], [place], [place] [address]    Special Requests   Final    NONE Performed at [facility], [address] [place], [place] [address]    Gram Stain   Final    MODERATE WBC PRESENT, PREDOMINANTLY PMN NO ORGANISMS SEEN    Culture   Final    NO GROWTH Performed at [facility], [place] [address], [place], [place] [address]    Report Status 06/30/2020 FINAL  Final  Fungus Culture With Stain     Status: None (Preliminary result)   Collection Time: 06/27/20 11:38 AM   Specimen: PATH Cytology Pleural fluid  Result Value Ref Range Status   Fungus Stain Final report  Final    Comment: (NOTE) Performed At: [facility] [place], [place] [id number] Rush Farmer MD [id number]    Fungus (Mycology) Culture PENDING  Incomplete   Fungal Source PLEURAL  Final    Comment: Performed at [facility],  [address] [place], [place] [address]  Acid Fast Smear (AFB)     Status: None   Collection Time: 06/27/20 11:38 AM   Specimen: PATH Cytology Pleural fluid  Result Value Ref Range Status   AFB Specimen Processing Concentration  Final   Acid Fast Smear Negative  Final    Comment: (NOTE) Performed At: [facility] [place], [place] [id number] Rush Farmer MD IR:[id number]    Source (AFB) PLEURAL  Final    Comment: Performed at [facility], [address] [place], [place] [address]  Fungus Culture Result     Status: None   Collection Time: 06/27/20 11:38 AM  Result Value Ref Range Status   Result 1 Comment  Final    Comment: (NOTE) KOH/Calcofluor preparation:  no fungus observed. Performed At: [facility] [place], [place] [id number] Rush Farmer MD PY:[id number]   MRSA PCR Screening     Status: None   Collection Time: 06/29/20  9:28 AM   Specimen: Nasopharyngeal  Result Value Ref Range Status   MRSA by PCR NEGATIVE NEGATIVE Final    Comment:        The GeneXpert MRSA Assay (FDA approved for NASAL specimens only), is one component of a comprehensive MRSA colonization surveillance program. It is not intended to diagnose MRSA infection nor to guide or monitor treatment for MRSA infections. Performed at [facility], [place], [place] [address]   Respiratory (~20 pathogens) panel by PCR     Status: None   Collection Time: 06/29/20  9:28 AM   Specimen: Nasopharyngeal Swab; Respiratory  Result Value Ref Range Status   Adenovirus NOT DETECTED NOT DETECTED Final   Coronavirus 229E NOT DETECTED NOT DETECTED Final    Comment: (NOTE) The Coronavirus on the Respiratory Panel, DOES NOT test for the novel  Coronavirus (2019 nCoV)    Coronavirus HKU1 NOT DETECTED NOT DETECTED Final   Coronavirus NL63 NOT DETECTED NOT DETECTED Final   Coronavirus OC43 NOT DETECTED NOT DETECTED Final    Metapneumovirus NOT DETECTED NOT DETECTED Final   Rhinovirus / Enterovirus NOT DETECTED NOT DETECTED Final   Influenza A NOT DETECTED NOT DETECTED Final   Influenza B NOT DETECTED NOT DETECTED Final   Parainfluenza Virus  1 NOT DETECTED NOT DETECTED Final   Parainfluenza Virus 2 NOT DETECTED NOT DETECTED Final   Parainfluenza Virus 3 NOT DETECTED NOT DETECTED Final   Parainfluenza Virus 4 NOT DETECTED NOT DETECTED Final   Respiratory Syncytial Virus NOT DETECTED NOT DETECTED Final   Bordetella pertussis NOT DETECTED NOT DETECTED Final   Bordetella Parapertussis NOT DETECTED NOT DETECTED Final   Chlamydophila pneumoniae NOT DETECTED NOT DETECTED Final   Mycoplasma pneumoniae NOT DETECTED NOT DETECTED Final    Comment: Performed at [facility], [place] [address] [place], [place] [address]     Discharge Instructions:   Discharge Instructions    Diet regular   Complete by: As directed    Discharge instructions   Complete by: As directed    Follow up with hospice team at home   Increase activity slowly   Complete by: As directed    No dressing needed   Complete by: As directed      Allergies as of 07/04/2020      Reactions   Rivaroxaban Hives      Medication List    STOP taking these medications   megestrol 625 MG/5ML suspension Commonly known as: MEGACE ES   predniSONE 10 MG tablet Commonly known as: DELTASONE   vitamin B-12 1000 MCG tablet Commonly known as: CYANOCOBALAMIN     TAKE these medications   acetaminophen 500 MG tablet Commonly known as: TYLENOL Take 1,000 mg by mouth every 6 (six) hours as needed for pain.   apixaban 5 MG Tabs tablet Commonly known as: ELIQUIS Take 1 tablet (5 mg total) by mouth 2 (two) times daily.   Breo Ellipta 200-25 MCG/INH Aepb Generic drug: fluticasone furoate-vilanterol Inhale 1 puff into the lungs daily.   calcitRIOL 0.5 MCG capsule Commonly known as: ROCALTROL Take 0.5 mcg by mouth daily.   Daliresp 250 MCG  Tabs Generic drug: Roflumilast Take 250 mcg by mouth daily.   dronabinol 5 MG capsule Commonly known as: MARINOL Take 1 capsule (5 mg total) by mouth 2 (two) times daily before lunch and supper.   escitalopram 20 MG tablet Commonly known as: LEXAPRO Take 20 mg by mouth daily.   fenofibrate 160 MG tablet Take 160 mg by mouth daily.   ferrous gluconate 324 MG tablet Commonly known as: FERGON Take 324 mg by mouth daily with breakfast.   Incruse Ellipta 62.5 MCG/INH Aepb Generic drug: umeclidinium bromide Inhale 1 puff into the lungs daily.   ipratropium 0.03 % nasal spray Commonly known as: ATROVENT Place 2 sprays into both nostrils daily.   levothyroxine 75 MCG tablet Commonly known as: SYNTHROID Take 75 mcg by mouth daily before breakfast.   montelukast 10 MG tablet Commonly known as: SINGULAIR Take 10 mg by mouth every morning.   potassium chloride 10 MEQ tablet Commonly known as: KLOR-CON Take 10 mEq by mouth daily.   prochlorperazine 10 MG tablet Commonly known as: COMPAZINE TAKE 1 TABLET(10 MG) BY MOUTH EVERY 6 HOURS AS NEEDED FOR NAUSEA OR VOMITING What changed: See the new instructions.   scopolamine 1 MG/3DAYS Commonly known as: TRANSDERM-SCOP Place 1 patch (1.5 mg total) onto the skin every 3 (three) days.   Vitamin D 50 MCG (2000 UT) Caps Take 1 capsule by mouth daily.            Discharge Care Instructions  (From admission, onward)         Start     Ordered   07/04/20 0000  No dressing  needed        07/04/20 8757          Follow-up Information    Chase Picket, MD. Go on 07/09/2020.   Specialty: Family Medicine Why: 1:50pm appointment  Contact information: [place] [address] [phone]                Time coordinating discharge: 31 minutes  Signed:  Jennye Boroughs  [facility] 07/04/2020, 10:55 AM   Pager on [email or web address]. If 7PM-7AM, please contact night-coverage at  [email or web address]

## 2020-07-04 NOTE — Progress Notes (Addendum)
[facility]) Hospital Liaison RN note:  Visited patient in room. She was resting in bed, alert and had no complaints. She said she was ready to go home. Spoke to daughter, [name] over the phone to confirm that DME had been delivered to the home. Transportation has been arranged for a 4pm pickup. Hospital care team is aware.   Please send signed and completed DNR home with patient. Please provide prescriptions at discharge as needed to ensure ongoing symptom management. I will fax the discharge summary to [facility] once it is available.  Please call with any hospice related questions or concerns.  Thank you for the opportunity to participate in this patient's care.  Zandra Abts, RN [facility] Liaison  [phone]

## 2020-07-04 NOTE — TOC Transition Note (Signed)
Transition of Care [facility]) - CM/SW Discharge Note   Patient Details  Name: Christine Morris MRN: [id number] Date of Birth: 1940-06-16  Transition of Care [facility]) CM/SW Contact:  Beverly Sessions, RN Phone Number: 07/04/2020, 12:05 PM   Clinical Narrative:    Patient to discharge home with hospice services today.  Kieth Brightly with [employer] aware O2 has been delivered to home  EMS packet on chart.  [facility] transport to pick up at 4.  Daughter updated    Final next level of care: Home w Hospice Care Barriers to Discharge: No Barriers Identified   Patient Goals and CMS Choice        Discharge Placement                Patient to be transferred to facility by: EMS Name of family member notified: dawn Patient and family notified of of transfer: 07/04/20  Discharge Plan and Services                                     Social Determinants of Health (SDOH) Interventions     Readmission Risk Interventions No flowsheet data found.

## 2020-07-04 NOTE — Progress Notes (Signed)
Mobility Specialist - Progress Note   07/04/20 1300  Mobility  Activity Dangled on edge of bed  Range of Motion/Exercises Right leg;Left leg (AP, SLR, HS, ABD)  Level of Assistance Moderate assist, patient does 50-74%  Assistive Device None  Distance Ambulated (ft) 0 ft  Mobility Response Tolerated well  Mobility performed by Mobility specialist  $Mobility charge 1 Mobility    Pre-mobility: 82 HR, 92% SpO2 During mobility: 102 HR, 91% SpO2 Post-mobility: 101 HR, 94% SpO2   Pt was sleeping in bed upon arrival utilizing 3L [place] O2. Pt easily awakened and agrees to session. Pt denied pain, nausea, and fatigue. Pt attempted to come into sitting position at EOB with modA, however; pt c/o weakness and requested return to supine prior to coming into full upright position. Pt returned to supine with minA and a short rest break was taken. Pt denied dizziness or SOB. Pt participated in bed-level therex: ankle pumps 2x10, straight leg raises x10, heel slides x5, and abduction x5. Pt began exercises with independence but required minA after 5 reps. Overall, pt tolerated session well. Pt was left in bed with all needs in reach and alarm set.    Kathee Delton Mobility Specialist 07/04/20, 1:47 PM

## 2020-07-05 ENCOUNTER — Encounter: Payer: Self-pay | Admitting: *Deleted

## 2020-07-05 ENCOUNTER — Other Ambulatory Visit: Payer: Self-pay | Admitting: Oncology

## 2020-07-05 LAB — ECHOCARDIOGRAM COMPLETE
Area-P 1/2: 3.27 cm2
Height: 63 in
S' Lateral: 1.86 cm
Weight: 1600.65 oz

## 2020-07-06 ENCOUNTER — Telehealth: Payer: Self-pay | Admitting: Hospice and Palliative Medicine

## 2020-07-06 MED ORDER — MORPHINE SULFATE (CONCENTRATE) 10 MG /0.5 ML PO SOLN: 5.0000 mg | ORAL | 0 refills | Status: AC | PRN

## 2020-07-06 MED ORDER — LORAZEPAM 0.5 MG PO TABS: 0.5000 mg | ORAL_TABLET | ORAL | 0 refills | Status: AC | PRN

## 2020-07-06 NOTE — Telephone Encounter (Addendum)
I received a call from patient's hospice nurse. Patient is reportedly more short of breath today.  No fever or chills.  SaO2 in mid to low 90s on 8 L O2.  Nurse is in process of obtaining a second oxygen concentrator to allow for higher liter flow if needed.  Okay to start lorazepam and morphine elixir for anxiety/dyspnea. Will send Rx to pharmacy. Also okay to start duo nebs if patient is unable to utilize her inhalers.

## 2020-07-13 ENCOUNTER — Telehealth: Payer: Self-pay | Admitting: *Deleted

## 2020-07-13 LAB — ACID FAST CULTURE WITH REFLEXED SENSITIVITIES (MYCOBACTERIA): Acid Fast Culture: NEGATIVE

## 2020-07-24 NOTE — Telephone Encounter (Signed)
DEVANA CALLED REPORTING THAT T EXPIRED AT 10:45 THIS MORNING

## 2020-07-24 DEATH — deceased

## 2020-08-06 LAB — FUNGAL ORGANISM REFLEX

## 2020-08-06 LAB — FUNGUS CULTURE WITH STAIN

## 2020-08-06 LAB — FUNGUS CULTURE RESULT

## 2020-08-10 LAB — ACID FAST CULTURE WITH REFLEXED SENSITIVITIES (MYCOBACTERIA): Acid Fast Culture: NEGATIVE

## 2022-03-14 IMAGING — CT CT CHEST W/O CM
2 of 4 series · 15 of 36 positions shown, 18 images · non-contrast
Comparison: May 03, 2020.

CLINICAL DATA: Shortness of breath.  Preop for bronchoscopy.

EXAM:
CT CHEST WITHOUT CONTRAST
TECHNIQUE: Multidetector CT imaging of the chest was performed following the
standard protocol without IV contrast.

[Series 5: super d · axial · 0.64mm/px · z∈[-319,-52]mm · 12 of 366 slices shown, 15 images]
[im 16/366  mediastinal]
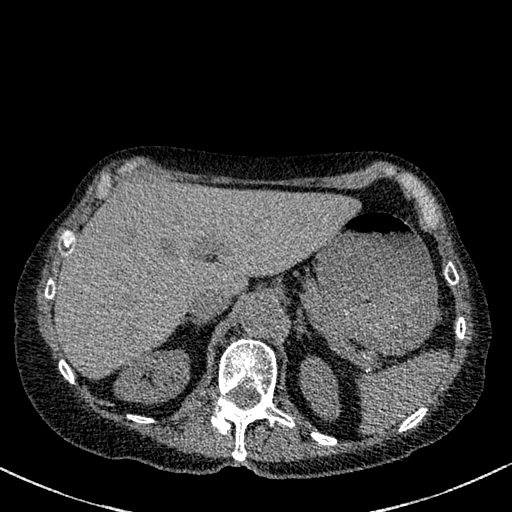
[im 16/366  lung]
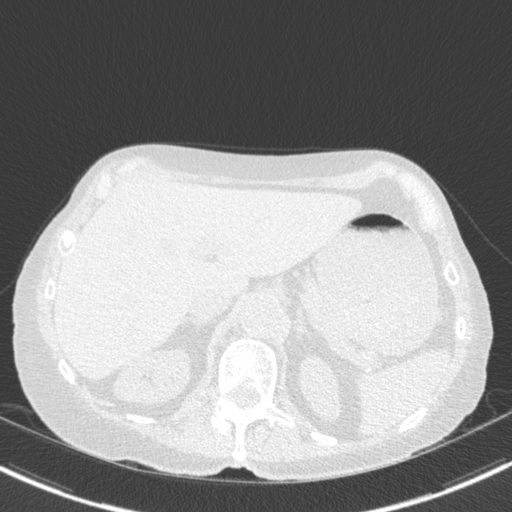
[im 48/366  lung]
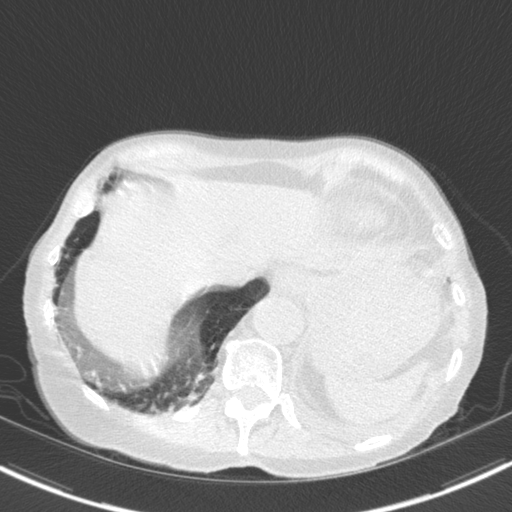
[im 80/366  lung]
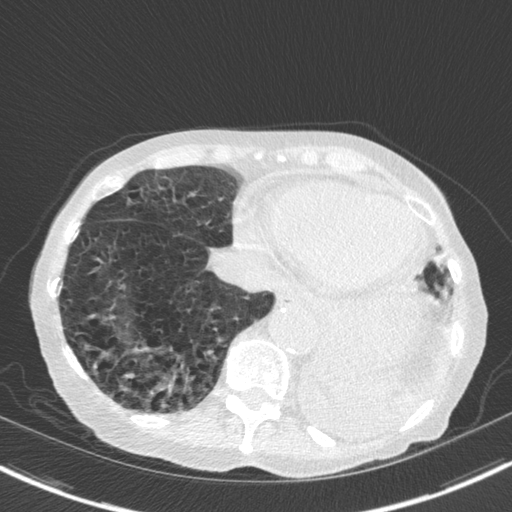
[im 112/366  lung]
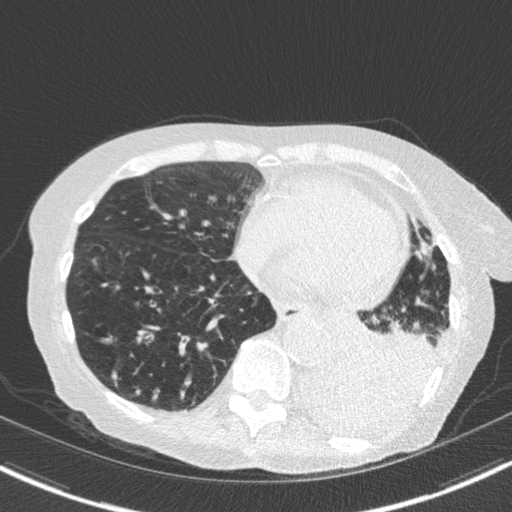
[im 143/366  mediastinal]
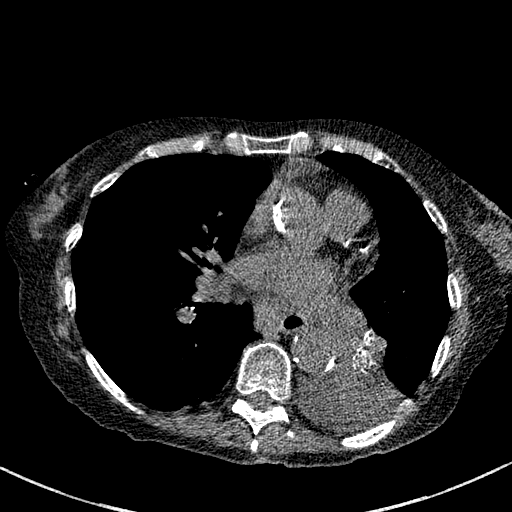
[im 143/366  lung]
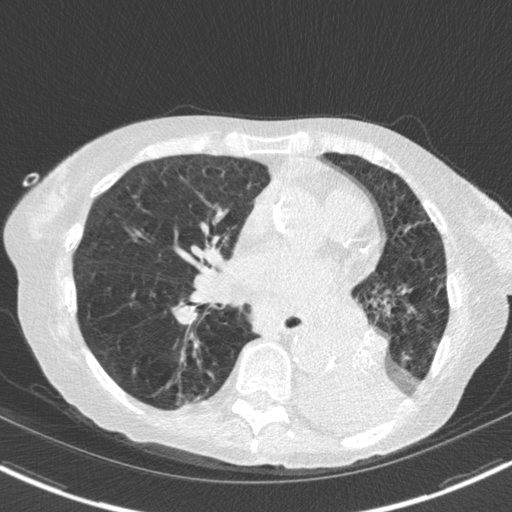
[im 175/366  lung]
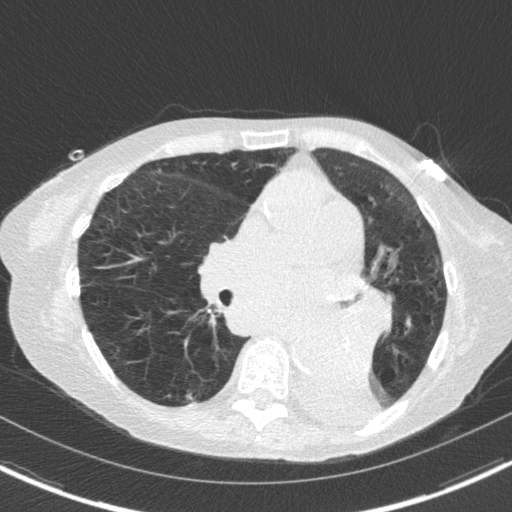
[im 191/366  lung]
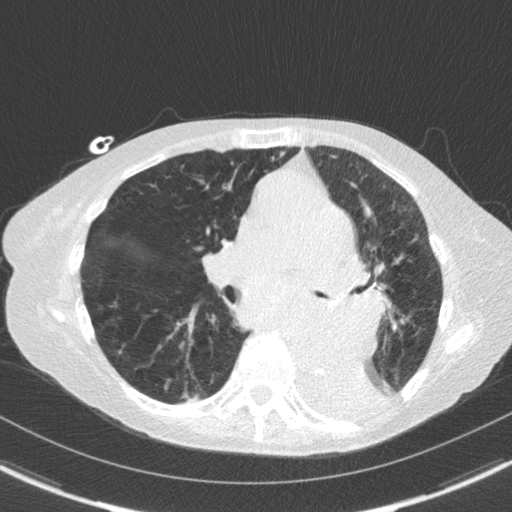
[im 223/366  lung]
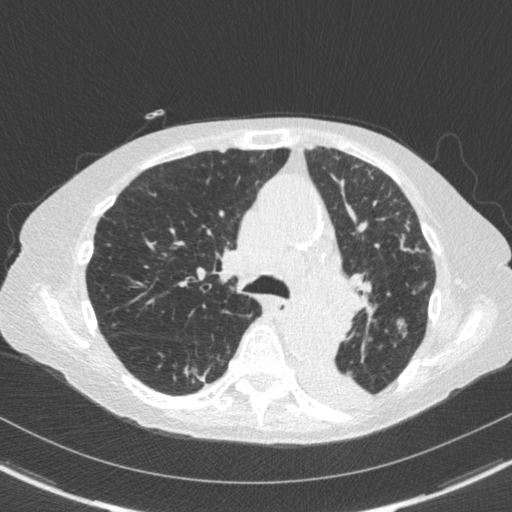
[im 254/366  mediastinal]
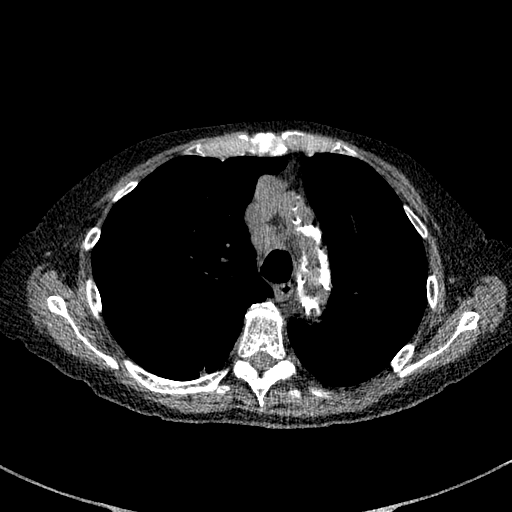
[im 254/366  lung]
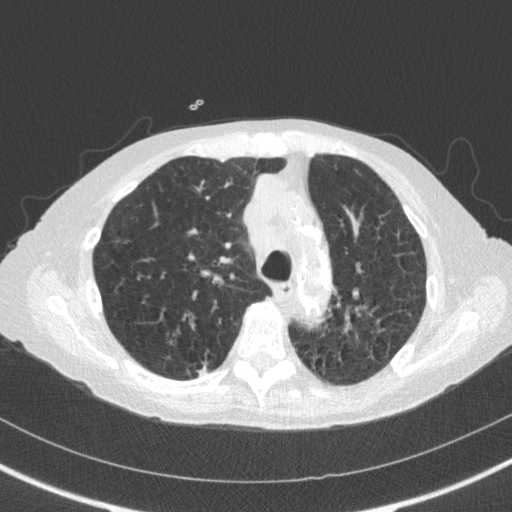
[im 286/366  lung]
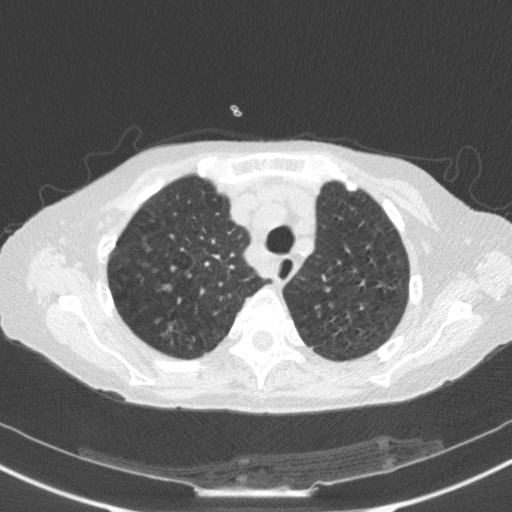
[im 318/366  lung]
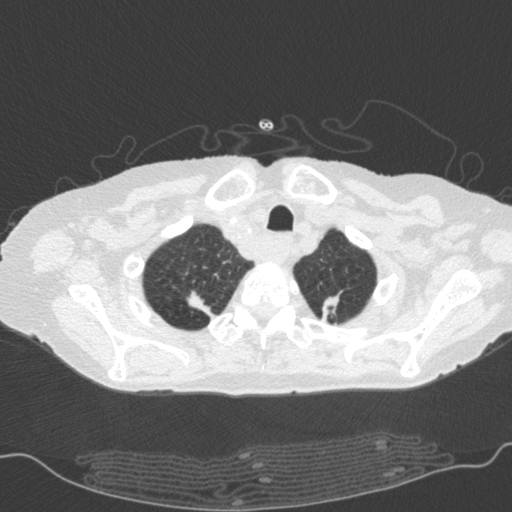
[im 350/366  lung]
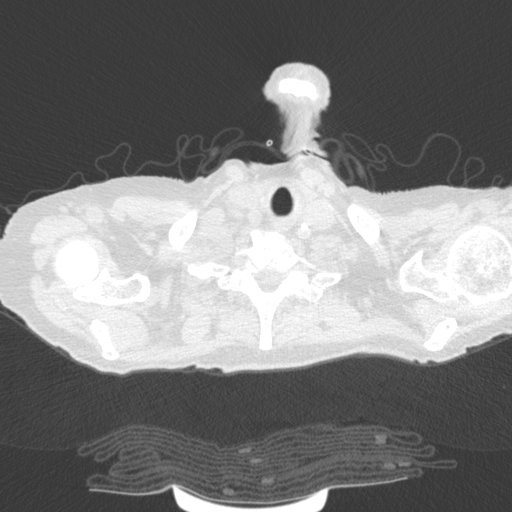

[Series 6: coronal · coronal · 0.66mm/px · 3 of 118 slices shown]
[im 24/118  lung]
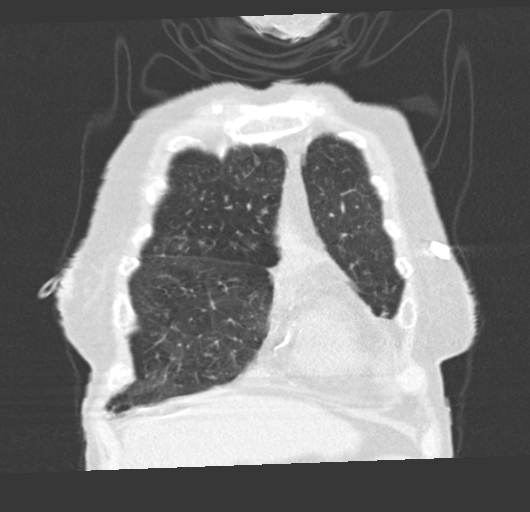
[im 47/118  lung]
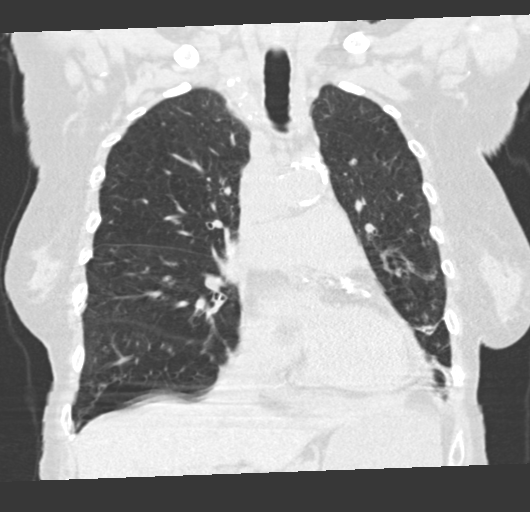
[im 71/118  lung]
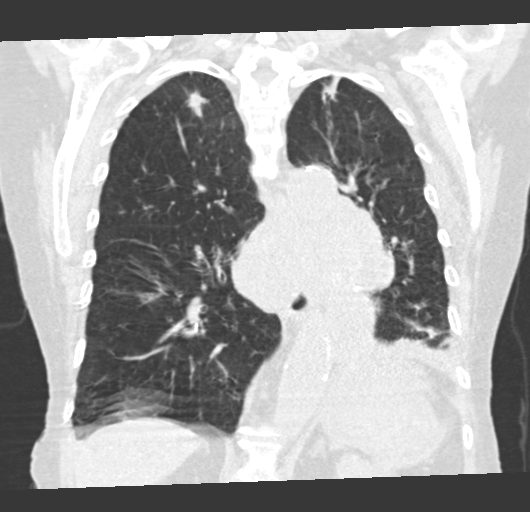

[15 of 36 positions shown; findings below may reference images not displayed]

FINDINGS: Cardiovascular: Atherosclerosis of thoracic aorta is noted without
aneurysm formation. Normal cardiac size. Small pericardial effusion
is noted. Coronary artery calcifications are noted.

Mediastinum/Nodes: As noted on prior exam, there is extensive
mediastinal adenopathy present particularly in the right
paratracheal pretracheal subcarinal and left hilar regions. This
results in narrowing of the left mainstem bronchus. Thyroid gland is
unremarkable. Esophagus is unremarkable.

Lungs/Pleura: No pneumothorax is noted. Mild biapical scarring is
noted. Mild to moderate left pleural effusion is noted with adjacent
left lower lobe postobstructive atelectasis or infiltrate.

Upper Abdomen: No acute abnormality.

Musculoskeletal: No chest wall mass or suspicious bone lesions
identified.
IMPRESSION: 1. Mild to moderate left pleural effusion is noted with adjacent
left lower lobe postobstructive atelectasis or infiltrate.
2. Small pericardial effusion is noted.
3. Coronary artery calcifications are noted suggesting coronary
artery disease.
4. As noted on prior exam, there is extensive mediastinal adenopathy
present particularly in the right paratracheal, subcarinal and left
hilar regions. This results in narrowing of the left mainstem
bronchus. This is concerning for malignancy or metastatic disease.

Aortic Atherosclerosis (HKRA2-B24.4).
